# Patient Record
Sex: Male | Born: 1945 | Race: Black or African American | Hispanic: No | Marital: Single | Smoking: Current every day smoker
Health system: Southern US, Community
[De-identification: ages and names within clinical notes are randomized; demographics above are authoritative.]

## PROBLEM LIST (undated history)

## (undated) DIAGNOSIS — H269 Unspecified cataract: Secondary | ICD-10-CM

## (undated) DIAGNOSIS — T4145XA Adverse effect of unspecified anesthetic, initial encounter: Secondary | ICD-10-CM

## (undated) DIAGNOSIS — T8859XA Other complications of anesthesia, initial encounter: Secondary | ICD-10-CM

## (undated) DIAGNOSIS — G4700 Insomnia, unspecified: Secondary | ICD-10-CM

## (undated) DIAGNOSIS — M2550 Pain in unspecified joint: Secondary | ICD-10-CM

## (undated) DIAGNOSIS — R531 Weakness: Secondary | ICD-10-CM

## (undated) DIAGNOSIS — F32A Depression, unspecified: Secondary | ICD-10-CM

## (undated) DIAGNOSIS — F419 Anxiety disorder, unspecified: Secondary | ICD-10-CM

## (undated) DIAGNOSIS — R609 Edema, unspecified: Secondary | ICD-10-CM

## (undated) DIAGNOSIS — R600 Localized edema: Secondary | ICD-10-CM

## (undated) DIAGNOSIS — I10 Essential (primary) hypertension: Secondary | ICD-10-CM

## (undated) DIAGNOSIS — M1990 Unspecified osteoarthritis, unspecified site: Secondary | ICD-10-CM

## (undated) DIAGNOSIS — R0602 Shortness of breath: Secondary | ICD-10-CM

## (undated) DIAGNOSIS — E785 Hyperlipidemia, unspecified: Secondary | ICD-10-CM

## (undated) DIAGNOSIS — F329 Major depressive disorder, single episode, unspecified: Secondary | ICD-10-CM

## (undated) DIAGNOSIS — I2510 Atherosclerotic heart disease of native coronary artery without angina pectoris: Secondary | ICD-10-CM

## (undated) HISTORY — PX: CORONARY ANGIOPLASTY: SHX604

## (undated) HISTORY — PX: ESOPHAGOGASTRODUODENOSCOPY: SHX1529

## (undated) HISTORY — PX: OTHER SURGICAL HISTORY: SHX169

---
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
## 1999-02-08 ENCOUNTER — Emergency Department (HOSPITAL_COMMUNITY): Admission: EM | Admit: 1999-02-08 | Discharge: 1999-02-08 | Payer: Self-pay | Admitting: Emergency Medicine

## 1999-02-09 ENCOUNTER — Encounter: Payer: Self-pay | Admitting: Emergency Medicine

## 1999-10-12 HISTORY — PX: BACK SURGERY: SHX140

## 2000-02-23 ENCOUNTER — Emergency Department (HOSPITAL_COMMUNITY): Admission: EM | Admit: 2000-02-23 | Discharge: 2000-02-23 | Payer: Self-pay | Admitting: Emergency Medicine

## 2000-03-23 ENCOUNTER — Encounter: Payer: Self-pay | Admitting: Surgery

## 2000-03-25 ENCOUNTER — Ambulatory Visit (HOSPITAL_COMMUNITY): Admission: RE | Admit: 2000-03-25 | Discharge: 2000-03-26 | Payer: Self-pay | Admitting: Surgery

## 2001-10-11 HISTORY — PX: REPLANTATION THUMB: SUR1233

## 2005-02-09 ENCOUNTER — Inpatient Hospital Stay: Payer: Self-pay | Admitting: Cardiology

## 2005-02-09 ENCOUNTER — Other Ambulatory Visit: Payer: Self-pay

## 2005-02-10 ENCOUNTER — Other Ambulatory Visit: Payer: Self-pay

## 2005-02-11 ENCOUNTER — Other Ambulatory Visit: Payer: Self-pay

## 2005-06-04 ENCOUNTER — Emergency Department (HOSPITAL_COMMUNITY): Admission: EM | Admit: 2005-06-04 | Discharge: 2005-06-04 | Payer: Self-pay | Admitting: Emergency Medicine

## 2005-06-29 ENCOUNTER — Ambulatory Visit (HOSPITAL_COMMUNITY): Admission: RE | Admit: 2005-06-29 | Discharge: 2005-06-29 | Payer: Self-pay | Admitting: Surgery

## 2005-07-28 ENCOUNTER — Emergency Department: Payer: Self-pay | Admitting: Emergency Medicine

## 2005-07-29 ENCOUNTER — Other Ambulatory Visit: Payer: Self-pay

## 2005-08-07 ENCOUNTER — Emergency Department (HOSPITAL_COMMUNITY): Admission: EM | Admit: 2005-08-07 | Discharge: 2005-08-07 | Payer: Self-pay | Admitting: Emergency Medicine

## 2005-10-11 HISTORY — PX: HERNIA REPAIR: SHX51

## 2005-12-04 ENCOUNTER — Ambulatory Visit: Payer: Self-pay | Admitting: Neurology

## 2008-04-27 ENCOUNTER — Emergency Department: Payer: Self-pay | Admitting: Unknown Physician Specialty

## 2008-12-21 ENCOUNTER — Emergency Department: Payer: Self-pay | Admitting: Emergency Medicine

## 2010-05-11 HISTORY — PX: CARDIAC CATHETERIZATION: SHX172

## 2011-10-07 ENCOUNTER — Inpatient Hospital Stay: Payer: Self-pay | Admitting: Internal Medicine

## 2013-08-03 ENCOUNTER — Other Ambulatory Visit: Payer: Self-pay | Admitting: Orthopedic Surgery

## 2013-09-11 ENCOUNTER — Encounter (HOSPITAL_BASED_OUTPATIENT_CLINIC_OR_DEPARTMENT_OTHER): Payer: Self-pay | Admitting: *Deleted

## 2013-09-11 NOTE — Progress Notes (Signed)
Pt very slow talker-hard to get accurate medical info-called [facility] for pcp and cardiology notes-first said no heart problems, but has 2 stents-last ov cardiology 2013-no adm with cp or sob in past-smokes- To come in for ekg-bmet

## 2013-09-11 NOTE — Progress Notes (Signed)
Reviewed notes with dr Loney Laurence f/u with pcp-and pcp knows he is having finger surgery-has not seen cardiology since 2013- Dr Gypsy Balsam ok with this surgery without seeing cardiology

## 2013-09-11 NOTE — Progress Notes (Signed)
09/11/13 1058  OBSTRUCTIVE SLEEP APNEA  Have you ever been diagnosed with sleep apnea through a sleep study? No  Do you snore loudly (loud enough to be heard through closed doors)?  1  Do you often feel tired, fatigued, or sleepy during the daytime? 0  Has anyone observed you stop breathing during your sleep? 0  Do you have, or are you being treated for high blood pressure? 1  BMI more than 35 kg/m2? 0  Age over 66 years old? 1  Neck circumference greater than 40 cm/18 inches? 0  Gender: 1  Obstructive Sleep Apnea Score 4

## 2013-09-12 ENCOUNTER — Other Ambulatory Visit: Payer: Self-pay

## 2013-09-12 ENCOUNTER — Encounter (HOSPITAL_BASED_OUTPATIENT_CLINIC_OR_DEPARTMENT_OTHER)
Admission: RE | Admit: 2013-09-12 | Discharge: 2013-09-12 | Disposition: A | Discharge status: Home / Self Care | Payer: Medicare Other | Source: Ambulatory Visit | Attending: Orthopedic Surgery | Admitting: Orthopedic Surgery

## 2013-09-12 DIAGNOSIS — Z01812 Encounter for preprocedural laboratory examination: Secondary | ICD-10-CM | POA: Insufficient documentation

## 2013-09-12 DIAGNOSIS — Z01810 Encounter for preprocedural cardiovascular examination: Secondary | ICD-10-CM | POA: Insufficient documentation

## 2013-09-12 DIAGNOSIS — Z01818 Encounter for other preprocedural examination: Secondary | ICD-10-CM | POA: Insufficient documentation

## 2013-09-12 LAB — BASIC METABOLIC PANEL
CO2: 24 mEq/L (ref 19–32)
Chloride: 100 mEq/L (ref 96–112)
Glucose, Bld: 82 mg/dL (ref 70–99)
Sodium: 136 mEq/L (ref 135–145)

## 2013-09-13 ENCOUNTER — Encounter (HOSPITAL_BASED_OUTPATIENT_CLINIC_OR_DEPARTMENT_OTHER): Payer: Self-pay | Admitting: *Deleted

## 2013-09-13 ENCOUNTER — Encounter (HOSPITAL_BASED_OUTPATIENT_CLINIC_OR_DEPARTMENT_OTHER)
Admission: RE | Disposition: A | Discharge status: Home / Self Care | Payer: Self-pay | Source: Ambulatory Visit | Attending: Orthopedic Surgery

## 2013-09-13 ENCOUNTER — Encounter (HOSPITAL_BASED_OUTPATIENT_CLINIC_OR_DEPARTMENT_OTHER): Payer: Medicare Other | Admitting: Anesthesiology

## 2013-09-13 ENCOUNTER — Ambulatory Visit (HOSPITAL_BASED_OUTPATIENT_CLINIC_OR_DEPARTMENT_OTHER)
Admission: RE | Admit: 2013-09-13 | Discharge: 2013-09-13 | Disposition: A | Discharge status: Home / Self Care | Payer: Medicare Other | Source: Ambulatory Visit | Attending: Orthopedic Surgery | Admitting: Orthopedic Surgery

## 2013-09-13 ENCOUNTER — Ambulatory Visit (HOSPITAL_BASED_OUTPATIENT_CLINIC_OR_DEPARTMENT_OTHER): Payer: Medicare Other | Admitting: Anesthesiology

## 2013-09-13 DIAGNOSIS — M799 Soft tissue disorder, unspecified: Secondary | ICD-10-CM | POA: Insufficient documentation

## 2013-09-13 DIAGNOSIS — M898X9 Other specified disorders of bone, unspecified site: Secondary | ICD-10-CM | POA: Insufficient documentation

## 2013-09-13 DIAGNOSIS — Z01812 Encounter for preprocedural laboratory examination: Secondary | ICD-10-CM | POA: Insufficient documentation

## 2013-09-13 DIAGNOSIS — L608 Other nail disorders: Secondary | ICD-10-CM | POA: Insufficient documentation

## 2013-09-13 DIAGNOSIS — F341 Dysthymic disorder: Secondary | ICD-10-CM | POA: Insufficient documentation

## 2013-09-13 DIAGNOSIS — I10 Essential (primary) hypertension: Secondary | ICD-10-CM | POA: Insufficient documentation

## 2013-09-13 DIAGNOSIS — Z01810 Encounter for preprocedural cardiovascular examination: Secondary | ICD-10-CM | POA: Insufficient documentation

## 2013-09-13 DIAGNOSIS — F17200 Nicotine dependence, unspecified, uncomplicated: Secondary | ICD-10-CM | POA: Insufficient documentation

## 2013-09-13 HISTORY — DX: Depression, unspecified: F32.A

## 2013-09-13 HISTORY — DX: Major depressive disorder, single episode, unspecified: F32.9

## 2013-09-13 HISTORY — DX: Unspecified osteoarthritis, unspecified site: M19.90

## 2013-09-13 HISTORY — DX: Atherosclerotic heart disease of native coronary artery without angina pectoris: I25.10

## 2013-09-13 HISTORY — DX: Shortness of breath: R06.02

## 2013-09-13 HISTORY — DX: Anxiety disorder, unspecified: F41.9

## 2013-09-13 HISTORY — DX: Hyperlipidemia, unspecified: E78.5

## 2013-09-13 HISTORY — DX: Essential (primary) hypertension: I10

## 2013-09-13 HISTORY — PX: NAILBED REPAIR: SHX5028

## 2013-09-13 LAB — POCT HEMOGLOBIN-HEMACUE: Hemoglobin: 14.8 g/dL (ref 13.0–17.0)

## 2013-09-13 SURGERY — REPAIR, NAIL BED
Anesthesia: General | Site: Thumb | Laterality: Left

## 2013-09-13 MED ORDER — BUPIVACAINE HCL (PF) 0.5 % IJ SOLN
INTRAMUSCULAR | Status: AC
  Filled 2013-09-13: qty 30

## 2013-09-13 MED ORDER — MEPERIDINE HCL 25 MG/ML IJ SOLN: 6.2500 mg | INTRAMUSCULAR | Status: DC | PRN

## 2013-09-13 MED ORDER — OXYCODONE HCL 5 MG/5ML PO SOLN: 5.0000 mg | Freq: Once | ORAL | Status: AC | PRN

## 2013-09-13 MED ORDER — FENTANYL CITRATE 0.05 MG/ML IJ SOLN
INTRAMUSCULAR | Status: AC
  Filled 2013-09-13: qty 4

## 2013-09-13 MED ORDER — OXYCODONE HCL 5 MG PO TABS: 5.0000 mg | ORAL_TABLET | ORAL | Status: DC | PRN

## 2013-09-13 MED ORDER — OXYCODONE HCL 5 MG PO TABS
5.0000 mg | ORAL_TABLET | Freq: Once | ORAL | Status: AC | PRN
  Administered 2013-09-13: 5 mg via ORAL

## 2013-09-13 MED ORDER — PROPOFOL 10 MG/ML IV BOLUS
INTRAVENOUS | Status: AC
  Filled 2013-09-13: qty 20

## 2013-09-13 MED ORDER — BUPIVACAINE HCL (PF) 0.25 % IJ SOLN
INTRAMUSCULAR | Status: DC | PRN
  Administered 2013-09-13: 18 mL

## 2013-09-13 MED ORDER — OXYCODONE HCL 5 MG PO TABS
ORAL_TABLET | ORAL | Status: AC
  Filled 2013-09-13: qty 1

## 2013-09-13 MED ORDER — CEFAZOLIN SODIUM-DEXTROSE 2-3 GM-% IV SOLR
2.0000 g | INTRAVENOUS | Status: AC
  Administered 2013-09-13: 2 g via INTRAVENOUS

## 2013-09-13 MED ORDER — MIDAZOLAM HCL 2 MG/2ML IJ SOLN
INTRAMUSCULAR | Status: AC
  Filled 2013-09-13: qty 2

## 2013-09-13 MED ORDER — CHLORHEXIDINE GLUCONATE 4 % EX LIQD: 60.0000 mL | Freq: Once | CUTANEOUS | Status: DC

## 2013-09-13 MED ORDER — ONDANSETRON HCL 4 MG/2ML IJ SOLN: 4.0000 mg | Freq: Once | INTRAMUSCULAR | Status: DC | PRN

## 2013-09-13 MED ORDER — HYDROMORPHONE HCL PF 1 MG/ML IJ SOLN: 0.2500 mg | INTRAMUSCULAR | Status: DC | PRN

## 2013-09-13 MED ORDER — LACTATED RINGERS IV SOLN
INTRAVENOUS | Status: DC
  Administered 2013-09-13: 12:00:00 via INTRAVENOUS

## 2013-09-13 MED ORDER — MIDAZOLAM HCL 5 MG/5ML IJ SOLN
INTRAMUSCULAR | Status: DC | PRN
  Administered 2013-09-13: 2 mg via INTRAVENOUS

## 2013-09-13 MED ORDER — DEXAMETHASONE SODIUM PHOSPHATE 4 MG/ML IJ SOLN
INTRAMUSCULAR | Status: DC | PRN
  Administered 2013-09-13: 10 mg via INTRAVENOUS

## 2013-09-13 MED ORDER — FENTANYL CITRATE 0.05 MG/ML IJ SOLN
INTRAMUSCULAR | Status: DC | PRN
  Administered 2013-09-13: 100 ug via INTRAVENOUS

## 2013-09-13 MED ORDER — BUPIVACAINE HCL (PF) 0.25 % IJ SOLN
INTRAMUSCULAR | Status: AC
  Filled 2013-09-13: qty 30

## 2013-09-13 MED ORDER — FENTANYL CITRATE 0.05 MG/ML IJ SOLN: 50.0000 ug | INTRAMUSCULAR | Status: DC | PRN

## 2013-09-13 MED ORDER — PROPOFOL 10 MG/ML IV BOLUS
INTRAVENOUS | Status: DC | PRN
  Administered 2013-09-13: 150 mg via INTRAVENOUS

## 2013-09-13 MED ORDER — SODIUM BICARBONATE 4 % IV SOLN
INTRAVENOUS | Status: AC
  Filled 2013-09-13: qty 5

## 2013-09-13 MED ORDER — CEFAZOLIN SODIUM 1-5 GM-% IV SOLN
INTRAVENOUS | Status: AC
  Filled 2013-09-13: qty 100

## 2013-09-13 MED ORDER — LIDOCAINE HCL (PF) 1 % IJ SOLN
INTRAMUSCULAR | Status: AC
  Filled 2013-09-13: qty 30

## 2013-09-13 MED ORDER — LIDOCAINE HCL (CARDIAC) 20 MG/ML IV SOLN
INTRAVENOUS | Status: DC | PRN
  Administered 2013-09-13: 100 mg via INTRAVENOUS

## 2013-09-13 MED ORDER — ONDANSETRON HCL 4 MG/2ML IJ SOLN
INTRAMUSCULAR | Status: DC | PRN
  Administered 2013-09-13: 4 mg via INTRAVENOUS

## 2013-09-13 MED ORDER — MIDAZOLAM HCL 2 MG/2ML IJ SOLN: 1.0000 mg | INTRAMUSCULAR | Status: DC | PRN

## 2013-09-13 SURGICAL SUPPLY — 65 items
BANDAGE COBAN STERILE 2 (GAUZE/BANDAGES/DRESSINGS) IMPLANT
BANDAGE ELASTIC 3 VELCRO ST LF (GAUZE/BANDAGES/DRESSINGS) ×2 IMPLANT
BANDAGE ELASTIC 4 VELCRO ST LF (GAUZE/BANDAGES/DRESSINGS) IMPLANT
BLADE SURG 15 STRL LF DISP TIS (BLADE) ×2 IMPLANT
BLADE SURG 15 STRL SS (BLADE) ×2
BNDG COHESIVE 1X5 TAN STRL LF (GAUZE/BANDAGES/DRESSINGS) ×2 IMPLANT
BNDG COHESIVE 3X5 TAN STRL LF (GAUZE/BANDAGES/DRESSINGS) IMPLANT
BNDG CONFORM 2 STRL LF (GAUZE/BANDAGES/DRESSINGS) IMPLANT
BNDG CONFORM 3 STRL LF (GAUZE/BANDAGES/DRESSINGS) IMPLANT
BNDG ELASTIC 2 VLCR STRL LF (GAUZE/BANDAGES/DRESSINGS) IMPLANT
BNDG GAUZE ELAST 4 BULKY (GAUZE/BANDAGES/DRESSINGS) IMPLANT
BRUSH SCRUB EZ PLAIN DRY (MISCELLANEOUS) ×2 IMPLANT
CANISTER SUCT 1200ML W/VALVE (MISCELLANEOUS) IMPLANT
CORDS BIPOLAR (ELECTRODE) ×2 IMPLANT
COVER MAYO STAND STRL (DRAPES) ×2 IMPLANT
COVER TABLE BACK 60X90 (DRAPES) ×2 IMPLANT
CUFF TOURNIQUET SINGLE 18IN (TOURNIQUET CUFF) ×4 IMPLANT
CUFF TOURNIQUET SINGLE 24IN (TOURNIQUET CUFF) IMPLANT
DECANTER SPIKE VIAL GLASS SM (MISCELLANEOUS) IMPLANT
DRAPE EXTREMITY T 121X128X90 (DRAPE) ×2 IMPLANT
DRAPE OEC MINIVIEW 54X84 (DRAPES) IMPLANT
DRAPE SURG 17X23 STRL (DRAPES) ×2 IMPLANT
DRSG EMULSION OIL 3X3 NADH (GAUZE/BANDAGES/DRESSINGS) ×2 IMPLANT
GAUZE SPONGE 4X4 16PLY XRAY LF (GAUZE/BANDAGES/DRESSINGS) IMPLANT
GLOVE BIO SURGEON STRL SZ8 (GLOVE) ×2 IMPLANT
GLOVE SS BIOGEL STRL SZ 8 (GLOVE) ×1 IMPLANT
GLOVE SUPERSENSE BIOGEL SZ 8 (GLOVE) ×1
GLOVE SURG SS PI 7.0 STRL IVOR (GLOVE) ×2 IMPLANT
GOWN PREVENTION PLUS XLARGE (GOWN DISPOSABLE) ×2 IMPLANT
GOWN PREVENTION PLUS XXLARGE (GOWN DISPOSABLE) ×2 IMPLANT
NEEDLE HYPO 22GX1.5 SAFETY (NEEDLE) IMPLANT
NEEDLE HYPO 25X1 1.5 SAFETY (NEEDLE) ×2 IMPLANT
NS IRRIG 1000ML POUR BTL (IV SOLUTION) ×2 IMPLANT
PACK BASIN DAY SURGERY FS (CUSTOM PROCEDURE TRAY) ×2 IMPLANT
PAD ALCOHOL SWAB (MISCELLANEOUS) IMPLANT
PAD CAST 3X4 CTTN HI CHSV (CAST SUPPLIES) IMPLANT
PAD CAST 4YDX4 CTTN HI CHSV (CAST SUPPLIES) IMPLANT
PADDING CAST ABS 3INX4YD NS (CAST SUPPLIES) ×1
PADDING CAST ABS 4INX4YD NS (CAST SUPPLIES) ×1
PADDING CAST ABS COTTON 3X4 (CAST SUPPLIES) ×1 IMPLANT
PADDING CAST ABS COTTON 4X4 ST (CAST SUPPLIES) ×1 IMPLANT
PADDING CAST COTTON 3X4 STRL (CAST SUPPLIES)
PADDING CAST COTTON 4X4 STRL (CAST SUPPLIES)
SHEET MEDIUM DRAPE 40X70 STRL (DRAPES) IMPLANT
SPLINT FINGER 5/8X3.25 (SOFTGOODS) ×1 IMPLANT
SPLINT FINGER FOAM 3 9119 05 (SOFTGOODS) ×2
SPLINT PLASTER CAST XFAST 3X15 (CAST SUPPLIES) IMPLANT
SPLINT PLASTER CAST XFAST 4X15 (CAST SUPPLIES) IMPLANT
SPLINT PLASTER XTRA FAST SET 4 (CAST SUPPLIES)
SPLINT PLASTER XTRA FASTSET 3X (CAST SUPPLIES)
SPONGE GAUZE 4X4 12PLY (GAUZE/BANDAGES/DRESSINGS) ×2 IMPLANT
STOCKINETTE 4X48 STRL (DRAPES) ×2 IMPLANT
STRIP CLOSURE SKIN 1/2X4 (GAUZE/BANDAGES/DRESSINGS) IMPLANT
SUCTION FRAZIER TIP 10 FR DISP (SUCTIONS) IMPLANT
SUT CHROMIC 4 0 PS 2 18 (SUTURE) ×2 IMPLANT
SUT CHROMIC 5 0 P 3 (SUTURE) ×2 IMPLANT
SUT CHROMIC 6 0 G 1 (SUTURE) IMPLANT
SUT CHROMIC 6 0 PS 4 (SUTURE) IMPLANT
SUT PROLENE 4 0 PS 2 18 (SUTURE) ×2 IMPLANT
SYR BULB 3OZ (MISCELLANEOUS) ×2 IMPLANT
SYR CONTROL 10ML LL (SYRINGE) IMPLANT
TOWEL OR 17X24 6PK STRL BLUE (TOWEL DISPOSABLE) ×4 IMPLANT
TOWEL OR NON WOVEN STRL DISP B (DISPOSABLE) ×2 IMPLANT
TUBE CONNECTING 20X1/4 (TUBING) IMPLANT
UNDERPAD 30X30 INCONTINENT (UNDERPADS AND DIAPERS) ×2 IMPLANT

## 2013-09-13 NOTE — Op Note (Signed)
See dictation #[id number] Amanda Pea MD

## 2013-09-13 NOTE — H&P (Signed)
Matthew Norton is an 67 y.o. male.   Chief Complaint: presents for left thumb reconstruction HPI: Marland KitchenMarland KitchenPatient presents for evaluation and treatment of the of their upper extremity predicament. The patient denies neck back chest or of abdominal pain. The patient notes that they have no lower extremity problems. The patient from primarily complains of the upper extremity pain noted.  Past Medical History  Diagnosis Date  . Coronary artery disease   . Hypertension   . Depression   . Anxiety   . Shortness of breath     smoker over 50 yr  . Arthritis   . Hyperlipemia   . Presence of stent in coronary artery     x2    Past Surgical History  Procedure Laterality Date  . Cardiac catheterization  08,11    stents x2  . Clavicle surgery  1965    fx rt   . Back surgery  2001    lumb lam  . Hernia repair  2007    rt and lt ing   . Replantation thumb  2003    left    History reviewed. No pertinent family history. Social History:  reports that he has been smoking.  He does not have any smokeless tobacco history on file. He reports that he does not drink alcohol or use illicit drugs.  Allergies: No Known Allergies  Medications Prior to Admission  Medication Sig Dispense Refill  . aspirin 81 MG tablet Take 81 mg by mouth daily.      . diazepam (VALIUM) 5 MG tablet Take 5 mg by mouth every 12 (twelve) hours as needed for anxiety.      Marland Kitchen losartan (COZAAR) 25 MG tablet Take 25 mg by mouth daily.      . meloxicam (MOBIC) 15 MG tablet Take 15 mg by mouth daily.      . metoprolol tartrate (LOPRESSOR) 25 MG tablet Take 25 mg by mouth every morning.      . pravastatin (PRAVACHOL) 20 MG tablet Take 20 mg by mouth every evening.      . sertraline (ZOLOFT) 100 MG tablet Take 100 mg by mouth daily.        Results for orders placed during the hospital encounter of 09/13/13 (from the past 48 hour(s))  BASIC METABOLIC PANEL     Status: Abnormal   Collection Time    09/12/13  3:00 PM      Result Value  Range   Sodium 136  135 - 145 mEq/L   Potassium 4.9  3.5 - 5.1 mEq/L   Chloride 100  96 - 112 mEq/L   CO2 24  19 - 32 mEq/L   Glucose, Bld 82  70 - 99 mg/dL   BUN 9  6 - 23 mg/dL   Creatinine, Ser 1.61  0.50 - 1.35 mg/dL   Calcium 9.6  8.4 - 09.6 mg/dL   GFR calc non Af Amer 86 (*) >90 mL/min   GFR calc Af Amer >90  >90 mL/min   Comment: (NOTE)     The eGFR has been calculated using the CKD EPI equation.     This calculation has not been validated in all clinical situations.     eGFR's persistently <90 mL/min signify possible Chronic Kidney     Disease.  POCT HEMOGLOBIN-HEMACUE     Status: None   Collection Time    09/13/13 11:38 AM      Result Value Range   Hemoglobin 14.8  13.0 - 17.0  g/dL   No results found.  Review of Systems  HENT: Negative.   Eyes: Negative.   Cardiovascular: Negative.   Gastrointestinal: Negative.   Genitourinary: Negative.   Skin: Negative.   Neurological: Negative.   Endo/Heme/Allergies: Negative.     Blood pressure 150/84, pulse 56, temperature 98.3 F (36.8 C), temperature source Oral, resp. rate 16, height 5\' 9"  (1.753 m), weight 96.276 kg (212 lb 4 oz), SpO2 96.00%. Physical Exam ..The patient is alert and oriented in no acute distress the patient complains of pain in the affected upper extremity.  The patient is noted to have a normal HEENT exam.  Lung fields show equal chest expansion and no shortness of breath  abdomen exam is nontender without distention.  Lower extremity examination does not show any fracture dislocation or blood clot symptoms.  Pelvis is stable neck and back are stable and nontender Patient has a left thumb skin deformity with chronic pain and hypersensitivity Assessment/Plan .Marland KitchenWe are planning surgery for your upper extremity. The risk and benefits of surgery include risk of bleeding infection anesthesia damage to normal structures and failure of the surgery to accomplish its intended goals of relieving symptoms and  restoring function with this in mind we'll going to proceed. I have specifically discussed with the patient the pre-and postoperative regime and the does and don'ts and risk and benefits in great detail. Risk and benefits of surgery also include risk of dystrophy chronic nerve pain failure of the healing process to go onto completion and other inherent risks of surgery The relavent the pathophysiology of the disease/injury process, as well as the alternatives for treatment and postoperative course of action has been discussed in great detail with the patient who desires to proceed.  We will do everything in our power to help you (the patient) restore function to the upper extremity. Is a pleasure to see this patient today.   Karen Chafe 09/13/2013, 1:39 PM

## 2013-09-13 NOTE — Anesthesia Procedure Notes (Signed)
Procedure Name: LMA Insertion Date/Time: 09/13/2013 1:53 PM Performed by: Burna Cash Pre-anesthesia Checklist: Patient identified, Emergency Drugs available, Suction available and Patient being monitored Patient Re-evaluated:Patient Re-evaluated prior to inductionOxygen Delivery Method: Circle System Utilized Preoxygenation: Pre-oxygenation with 100% oxygen Intubation Type: IV induction Ventilation: Mask ventilation without difficulty LMA: LMA inserted LMA Size: 5.0 Number of attempts: 1 Airway Equipment and Method: bite block Placement Confirmation: positive ETCO2 Tube secured with: Tape Dental Injury: Teeth and Oropharynx as per pre-operative assessment

## 2013-09-13 NOTE — Transfer of Care (Signed)
Immediate Anesthesia Transfer of Care Note  Patient: Matthew Norton  Procedure(s) Performed: Procedure(s): LEFT THUMB NAIL REMOVAL, NAIL BED RECONSTRUCTION PARTIAL EXCISION DISTAL PHALANX WITH COMPLEX RECONSTRUCTION  (Left)  Patient Location: PACU  Anesthesia Type:General  Level of Consciousness: awake, alert  and oriented  Airway & Oxygen Therapy: Patient Spontanous Breathing and Patient connected to face mask oxygen  Post-op Assessment: Report given to PACU RN and Post -op Vital signs reviewed and stable  Post vital signs: Reviewed and stable  Complications: No apparent anesthesia complications

## 2013-09-13 NOTE — Anesthesia Preprocedure Evaluation (Signed)
Anesthesia Evaluation  Patient identified by MRN, date of birth, ID band Patient awake    Reviewed: Allergy & Precautions, H&P , NPO status , Patient's Chart, lab work & pertinent test results  Airway Mallampati: I TM Distance: >3 FB Neck ROM: Full    Dental   Pulmonary Current Smoker,          Cardiovascular hypertension, Pt. on medications     Neuro/Psych Anxiety Depression    GI/Hepatic   Endo/Other    Renal/GU      Musculoskeletal   Abdominal   Peds  Hematology   Anesthesia Other Findings   Reproductive/Obstetrics                           Anesthesia Physical Anesthesia Plan  ASA: III  Anesthesia Plan: General   Post-op Pain Management: MAC Combined w/ Regional for Post-op pain   Induction: Intravenous  Airway Management Planned: LMA  Additional Equipment:   Intra-op Plan:   Post-operative Plan: Extubation in OR  Informed Consent: I have reviewed the patients History and Physical, chart, labs and discussed the procedure including the risks, benefits and alternatives for the proposed anesthesia with the patient or authorized representative who has indicated his/her understanding and acceptance.     Plan Discussed with: CRNA and Surgeon  Anesthesia Plan Comments:         Anesthesia Quick Evaluation

## 2013-09-13 NOTE — Anesthesia Postprocedure Evaluation (Signed)
Anesthesia Post Note  Patient: Matthew Norton  Procedure(s) Performed: Procedure(s) (LRB): LEFT THUMB NAIL REMOVAL, NAIL BED RECONSTRUCTION PARTIAL EXCISION DISTAL PHALANX WITH COMPLEX RECONSTRUCTION  (Left)  Anesthesia type: general  Patient location: PACU  Post pain: Pain level controlled  Post assessment: Patient's Cardiovascular Status Stable  Last Vitals:  Filed Vitals:   09/13/13 1610  BP: 146/68  Pulse: 55  Temp: 36.5 C  Resp: 16    Post vital signs: Reviewed and stable  Level of consciousness: sedated  Complications: No apparent anesthesia complications

## 2013-09-17 ENCOUNTER — Encounter (HOSPITAL_BASED_OUTPATIENT_CLINIC_OR_DEPARTMENT_OTHER): Payer: Self-pay | Admitting: Orthopedic Surgery

## 2013-09-17 NOTE — Op Note (Signed)
NAMEJAMEISON, HAJI NO.:  [id number]  MEDICAL RECORD NO.:  [id number]  LOCATION:                               FACILITY:  [facility]  PHYSICIAN:  Dionne Ano. 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  PROCEDURE: 1. Evaluation under anesthesia, left thumb. 2. Partial nail plate excision, left thumb. 3. Removal of bony spur, left thumb distal phalanx. 4. Mass removal, left thumb. 5. Volar flap advancement, left thumb with associated nail bed repair.  SURGEON:  Dionne Ano. Amanda Pea, M.D.  ASSISTANT:  None.  COMPLICATIONS:  None.  ANESTHESIA:  General with postop block in the form of intermetacarpal block.  TOURNIQUET TIME:  Less than 30 minutes.  INDICATIONS:  The patient is a pleasant male who presents for above- mentioned diagnoses, I have counseled in regards to risks and benefits of surgery including risk of infection, bleeding, anesthesia, damage to normal structures, and failure of surgery to accomplish its intended goals, relieving symptoms and restoring function.  With this in mind, he desires to proceed.  All questions have been encouraged and answered preoperatively.  OPERATIVE PROCEDURE:  The patient was seen by myself and Anesthesia. Taken to the operating room where smooth induction general anesthesia, laid supine, appropriately padded, prepped, and draped in usual sterile fashion, Betadine scrub and paint.  Following this, patient underwent evaluation under anesthesia, good stability was noted.  Once this done, tourniquet was  insufflated, partial nail plate was removed with Therapist, nutritional.  Once this done, I made an en bloc excision of skin and subcu tissue.  This was a mass with abnormal skin tissue at the distal edge.  This was a chronically traumatized the area, I sent this for specimen to make sure this was not any type of in situ carcinoma or other abnormality given the chronic changes, chronic pain, and hypersensitivity.  Once this done, I then dissected down, excised the distal spur about the distal phalanx.  This was excision of a portion of the distal phalanx without difficulty.  Once this done, I irrigated copiously and then performed a volar Moberg advancement flap.  The patient tolerated this well.  This was inset with chromic suture and the nail bed was repaired nicely.  There were no complicating features.  All sponge, needle, and instrument counts were reported as correct.  At this time, I irrigated this, I did it multiple points during the operation and dressed him with Adaptic, Xeroform, and finger/thumb splint with sterile dressing.  He tolerated this well. There were no complicating features.  We look forward to seeing him back in the office in 12 days and remove his bandage.  I will go ahead and protect him for the first 3-4 weeks and begin active range of motion.  All questions have  been encouraged and answered.     Dionne Ano. Amanda Pea, M.D.   ______________________________ Dionne Ano. Amanda Pea, M.D.    [facility]  D:  09/13/2013  T:  09/14/2013  Job:  [id number]

## 2013-09-23 ENCOUNTER — Emergency Department: Payer: Self-pay | Admitting: Emergency Medicine

## 2014-09-17 ENCOUNTER — Encounter (INDEPENDENT_AMBULATORY_CARE_PROVIDER_SITE_OTHER): Payer: Self-pay

## 2014-09-17 ENCOUNTER — Encounter: Payer: Self-pay | Admitting: Internal Medicine

## 2014-09-17 ENCOUNTER — Ambulatory Visit (INDEPENDENT_AMBULATORY_CARE_PROVIDER_SITE_OTHER): Payer: Medicare Other | Admitting: Internal Medicine

## 2014-09-17 VITALS — BP 132/70 | HR 52 | Temp 97.8°F | Wt 225.0 lb

## 2014-09-17 DIAGNOSIS — L989 Disorder of the skin and subcutaneous tissue, unspecified: Secondary | ICD-10-CM

## 2014-09-17 DIAGNOSIS — L918 Other hypertrophic disorders of the skin: Secondary | ICD-10-CM

## 2014-09-17 DIAGNOSIS — R220 Localized swelling, mass and lump, head: Secondary | ICD-10-CM

## 2014-09-17 DIAGNOSIS — K118 Other diseases of salivary glands: Secondary | ICD-10-CM

## 2014-09-17 LAB — BASIC METABOLIC PANEL
BUN: 17 mg/dL (ref 6–23)
CALCIUM: 9.8 mg/dL (ref 8.4–10.5)
CO2: 26 meq/L (ref 19–32)
Chloride: 104 mEq/L (ref 96–112)
Creatinine, Ser: 1.1 mg/dL (ref 0.4–1.5)
GFR: 82.39 mL/min (ref >60.00)
GLUCOSE: 79 mg/dL (ref 70–99)
POTASSIUM: 4.3 meq/L (ref 3.5–5.1)
SODIUM: 136 meq/L (ref 135–145)

## 2014-09-17 NOTE — Addendum Note (Signed)
Addended by: Jearld Fenton on: 09/17/2014 02:12 PM   Modules accepted: Orders

## 2014-09-17 NOTE — Progress Notes (Signed)
Pre visit review using our clinic review tool, if applicable. No additional management support is needed unless otherwise documented below in the visit note. 

## 2014-09-17 NOTE — Progress Notes (Signed)
Subjective:    Patient ID: Matthew Norton, male    DOB: 1945/11/04, 68 y.o.   MRN: [id number]  HPI  Pt presents to the clinic today with c/o jaw pain. He reports this started 6 months ago. He has noticed "knots" growing behind both of his ears. He has has an associated burning in his ears. He denies any trauma to the area. He denies loss of hearing. He has not tried anything OTC.  Review of Systems      Past Medical History  Diagnosis Date  . Coronary artery disease   . Hypertension   . Depression   . Anxiety   . Shortness of breath     smoker over 50 yr  . Arthritis   . Hyperlipemia   . Presence of stent in coronary artery     x2    Current Outpatient Prescriptions  Medication Sig Dispense Refill  . aspirin 81 MG tablet Take 81 mg by mouth daily.    . diazepam (VALIUM) 5 MG tablet Take 5 mg by mouth every 12 (twelve) hours as needed for anxiety.    Marland Kitchen losartan (COZAAR) 25 MG tablet Take 25 mg by mouth daily.    . meloxicam (MOBIC) 15 MG tablet Take 15 mg by mouth daily.    . metoprolol tartrate (LOPRESSOR) 25 MG tablet Take 25 mg by mouth every morning.    Marland Kitchen oxyCODONE (ROXICODONE) 5 MG immediate release tablet Take 1 tablet (5 mg total) by mouth every 4 (four) hours as needed for severe pain. 40 tablet 0  . pravastatin (PRAVACHOL) 20 MG tablet Take 20 mg by mouth every evening.    . sertraline (ZOLOFT) 100 MG tablet Take 100 mg by mouth daily.     No current facility-administered medications for this visit.    No Known Allergies  No family history on file.  History   Social History  . Marital Status: Married    Spouse Name: N/A    Number of Children: N/A  . Years of Education: N/A   Occupational History  . Not on file.   Social History Main Topics  . Smoking status: Current Every Day Smoker -- 1.00 packs/day  . Smokeless tobacco: Not on file  . Alcohol Use: No  . Drug Use: No  . Sexual Activity: Not on file   Other Topics Concern  . Not on file    Social History Narrative     Constitutional: Denies fever, malaise, fatigue, headache or abrupt weight changes.  HEENT: Pt reports ear pain. Denies eye pain, eye redness,  ringing in the ears, wax buildup, runny nose, nasal congestion, bloody nose, or sore throat. Skin: Pt reports knots growing behind his ears. Denies redness, rashes, lesions or ulcercations.    No other specific complaints in a complete review of systems (except as listed in HPI above).  Objective:   Physical Exam  BP 132/70 mmHg  Pulse 52  Temp(Src) 97.8 F (36.6 C) (Oral)  Wt 225 lb (102.059 kg)  SpO2 98% Wt Readings from Last 3 Encounters:  09/17/14 225 lb (102.059 kg)  09/13/13 212 lb 4 oz (96.276 kg)    General: Appears his stated age, obese in NAD. Skin: Warm, dry and intact. Skin tag noted behind bilateral ears. HEENT: Head: normal shape and size; Ears: Tm's gray and intact, normal light reflex;Throat/Mouth: Teeth missing, mucosa pink and moist, no exudate, lesions or ulcerations noted.  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 Cardiovascular: Normal rate  and rhythm. S1,S2 noted.  No murmur, rubs or gallops noted. Pulmonary/Chest: Normal effort and positive vesicular breath sounds. No respiratory distress. No wheezes, rales or ronchi noted.      BMET    Component Value Date/Time   NA 136 09/12/2013 1500   K 4.9 09/12/2013 1500   CL 100 09/12/2013 1500   CO2 24 09/12/2013 1500   GLUCOSE 82 09/12/2013 1500   BUN 9 09/12/2013 1500   CREATININE 0.92 09/12/2013 1500   CALCIUM 9.6 09/12/2013 1500   GFRNONAA 86* 09/12/2013 1500   GFRAA >90 09/12/2013 1500          Assessment & Plan:   Parotid Mass:  Will obtain CT of head and neck Take Advil for the pain  Skin Tags of bilateral ears:  These can be snipped off We need to take care of the parotid mass first  Will follow up with you after CT scan is done

## 2014-09-18 ENCOUNTER — Telehealth: Payer: Self-pay | Admitting: Family Medicine

## 2014-09-18 NOTE — Telephone Encounter (Signed)
emmi mailed  °

## 2014-09-20 ENCOUNTER — Ambulatory Visit: Payer: Self-pay | Admitting: Internal Medicine

## 2014-09-23 ENCOUNTER — Encounter: Payer: Self-pay | Admitting: Internal Medicine

## 2014-10-21 ENCOUNTER — Telehealth: Payer: Self-pay | Admitting: Family Medicine

## 2014-10-21 DIAGNOSIS — I872 Venous insufficiency (chronic) (peripheral): Secondary | ICD-10-CM | POA: Diagnosis not present

## 2014-10-21 DIAGNOSIS — M7989 Other specified soft tissue disorders: Secondary | ICD-10-CM | POA: Diagnosis not present

## 2014-10-21 DIAGNOSIS — M79609 Pain in unspecified limb: Secondary | ICD-10-CM | POA: Diagnosis not present

## 2014-10-21 DIAGNOSIS — M549 Dorsalgia, unspecified: Secondary | ICD-10-CM | POA: Diagnosis not present

## 2014-10-21 NOTE — Telephone Encounter (Signed)
[facility] called.  Patient has a new patient appointment with Dr.Duncan in March.  Dr.Schnier wants to know if patient can be seen sooner for facial pain. Please advise.

## 2014-10-21 NOTE — Telephone Encounter (Signed)
Please schedule 57min appointment when possible, try to get the vascular records in the meantime. Thanks.

## 2014-10-25 ENCOUNTER — Encounter: Payer: Self-pay | Admitting: Family Medicine

## 2014-10-25 ENCOUNTER — Ambulatory Visit (INDEPENDENT_AMBULATORY_CARE_PROVIDER_SITE_OTHER): Payer: Medicare Other | Admitting: Family Medicine

## 2014-10-25 ENCOUNTER — Encounter (INDEPENDENT_AMBULATORY_CARE_PROVIDER_SITE_OTHER): Payer: Self-pay

## 2014-10-25 VITALS — BP 132/80 | HR 67 | Temp 98.4°F | Ht 69.0 in | Wt 227.5 lb

## 2014-10-25 DIAGNOSIS — I10 Essential (primary) hypertension: Secondary | ICD-10-CM

## 2014-10-25 DIAGNOSIS — F329 Major depressive disorder, single episode, unspecified: Secondary | ICD-10-CM

## 2014-10-25 DIAGNOSIS — I2510 Atherosclerotic heart disease of native coronary artery without angina pectoris: Secondary | ICD-10-CM

## 2014-10-25 DIAGNOSIS — E785 Hyperlipidemia, unspecified: Secondary | ICD-10-CM

## 2014-10-25 DIAGNOSIS — M542 Cervicalgia: Secondary | ICD-10-CM

## 2014-10-25 DIAGNOSIS — R220 Localized swelling, mass and lump, head: Secondary | ICD-10-CM

## 2014-10-25 DIAGNOSIS — F419 Anxiety disorder, unspecified: Secondary | ICD-10-CM

## 2014-10-25 DIAGNOSIS — K118 Other diseases of salivary glands: Secondary | ICD-10-CM

## 2014-10-25 DIAGNOSIS — I872 Venous insufficiency (chronic) (peripheral): Secondary | ICD-10-CM

## 2014-10-25 DIAGNOSIS — F418 Other specified anxiety disorders: Secondary | ICD-10-CM

## 2014-10-25 NOTE — Progress Notes (Signed)
Pre visit review using our clinic review tool, if applicable. No additional management support is needed unless otherwise documented below in the visit note.  Seeing patient for first time today.  I was told the point of the visit was his neck pain.  Being seen by vascular surgery for leg venous insufficiency, notes reviewed.  With B arm pain, L>R, burning, from the neck to the elbow. Known deg changes in the C spine.  Taking aleve.  Gabapentin didn't help.   Also with h/o anxiety, "jumpy" and "moody", better on SSRI with no SI/HI.   R parotid mass.  Prev seen by Avie Echevaria here, sent for CT, but there was no notification to our office here that the CT had been done.  He hasn't had f/u about this.  CT report d/w pt.  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    PMH and SH reviewed  ROS: See HPI, otherwise noncontributory.  Meds, vitals, and allergies reviewed.   nad ncat except for R parotid mass Neck supple, no LA No dec in ROM at the neck but pain with ROM No weakness in the BUE rrr ctab Abd soft, not ttp Ext with 1+ BLE edema

## 2014-10-25 NOTE — Patient Instructions (Signed)
Matthew Norton will call about your referral. I need to get the parotid mass evaluated before we do anything about your neck.  We'll likely refer you to the spine clinic when I have the details on the parotid mass.  Please call about a follow up with the cardiology clinic.  Take care.

## 2014-10-27 ENCOUNTER — Encounter: Payer: Self-pay | Admitting: Family Medicine

## 2014-10-27 DIAGNOSIS — I10 Essential (primary) hypertension: Secondary | ICD-10-CM | POA: Insufficient documentation

## 2014-10-27 DIAGNOSIS — R220 Localized swelling, mass and lump, head: Secondary | ICD-10-CM | POA: Insufficient documentation

## 2014-10-27 DIAGNOSIS — M4802 Spinal stenosis, cervical region: Secondary | ICD-10-CM | POA: Insufficient documentation

## 2014-10-27 DIAGNOSIS — I2510 Atherosclerotic heart disease of native coronary artery without angina pectoris: Secondary | ICD-10-CM | POA: Insufficient documentation

## 2014-10-27 DIAGNOSIS — F419 Anxiety disorder, unspecified: Secondary | ICD-10-CM

## 2014-10-27 DIAGNOSIS — R221 Localized swelling, mass and lump, neck: Secondary | ICD-10-CM

## 2014-10-27 DIAGNOSIS — E785 Hyperlipidemia, unspecified: Secondary | ICD-10-CM | POA: Insufficient documentation

## 2014-10-27 DIAGNOSIS — M542 Cervicalgia: Secondary | ICD-10-CM | POA: Insufficient documentation

## 2014-10-27 DIAGNOSIS — I872 Venous insufficiency (chronic) (peripheral): Secondary | ICD-10-CM | POA: Insufficient documentation

## 2014-10-27 DIAGNOSIS — K118 Other diseases of salivary glands: Secondary | ICD-10-CM | POA: Insufficient documentation

## 2014-10-27 DIAGNOSIS — R900 Intracranial space-occupying lesion found on diagnostic imaging of central nervous system: Secondary | ICD-10-CM | POA: Insufficient documentation

## 2014-10-27 DIAGNOSIS — F329 Major depressive disorder, single episode, unspecified: Secondary | ICD-10-CM | POA: Insufficient documentation

## 2014-10-27 NOTE — Assessment & Plan Note (Signed)
Requested patient to f/u with cards.

## 2014-10-27 NOTE — Assessment & Plan Note (Signed)
We'll try to address this after parotid mass w/u done. No change in meds at this point.

## 2014-10-27 NOTE — Assessment & Plan Note (Signed)
No change in meds.  Will review old records.

## 2014-10-27 NOTE — Assessment & Plan Note (Signed)
Per vascular clinic.

## 2014-10-27 NOTE — Assessment & Plan Note (Signed)
Have reported to [facility] site manager about the scanning process. This is first time I've heard of such an issue.  Baity reported no knowledge of the CT being completed.  I was seeing patient for the first time today.  Will refer to ENT.

## 2014-10-29 DIAGNOSIS — D37030 Neoplasm of uncertain behavior of the parotid salivary glands: Secondary | ICD-10-CM | POA: Diagnosis not present

## 2014-10-29 DIAGNOSIS — L918 Other hypertrophic disorders of the skin: Secondary | ICD-10-CM | POA: Diagnosis not present

## 2014-10-29 DIAGNOSIS — T65222A Toxic effect of tobacco cigarettes, intentional self-harm, initial encounter: Secondary | ICD-10-CM | POA: Diagnosis not present

## 2014-10-29 DIAGNOSIS — D110 Benign neoplasm of parotid gland: Secondary | ICD-10-CM | POA: Diagnosis not present

## 2014-10-31 ENCOUNTER — Ambulatory Visit: Payer: Self-pay | Admitting: Otolaryngology

## 2014-10-31 DIAGNOSIS — Z01810 Encounter for preprocedural cardiovascular examination: Secondary | ICD-10-CM | POA: Diagnosis not present

## 2014-10-31 DIAGNOSIS — I2510 Atherosclerotic heart disease of native coronary artery without angina pectoris: Secondary | ICD-10-CM | POA: Diagnosis not present

## 2014-10-31 DIAGNOSIS — Z01812 Encounter for preprocedural laboratory examination: Secondary | ICD-10-CM | POA: Diagnosis not present

## 2014-10-31 DIAGNOSIS — I10 Essential (primary) hypertension: Secondary | ICD-10-CM | POA: Diagnosis not present

## 2014-10-31 LAB — CBC WITH DIFFERENTIAL/PLATELET
Basophil #: 0 10*3/uL (ref 0.0–0.1)
Basophil %: 0.2 %
EOS ABS: 0.1 10*3/uL (ref 0.0–0.7)
Eosinophil %: 1.4 %
HCT: 47.4 % (ref 40.0–52.0)
HGB: 15.6 g/dL (ref 13.0–18.0)
Lymphocyte #: 2.1 10*3/uL (ref 1.0–3.6)
Lymphocyte %: 36.1 %
MCH: 31.2 pg (ref 26.0–34.0)
MCHC: 32.9 g/dL (ref 32.0–36.0)
MCV: 95 fL (ref 80–100)
MONOS PCT: 7.9 %
Monocyte #: 0.5 x10 3/mm (ref 0.2–1.0)
NEUTROS PCT: 54.4 %
Neutrophil #: 3.2 10*3/uL (ref 1.4–6.5)
PLATELETS: 209 10*3/uL (ref 150–440)
RBC: 4.99 10*6/uL (ref 4.40–5.90)
RDW: 13.9 % (ref 11.5–14.5)
WBC: 5.8 10*3/uL (ref 3.8–10.6)

## 2014-11-06 ENCOUNTER — Telehealth: Payer: Self-pay | Admitting: Family Medicine

## 2014-11-06 NOTE — Telephone Encounter (Signed)
Becky the surgery coordinator with Dr [facility] office called in this morning. She said that she faxed in surgical clearance for Mr Vandenbrink's surgery that is scheduled on 11/20/14. She is requesting to have this completed. If it was not received, she will fax a new request. Please give Jacqlyn Larsen a call back @ [phone].

## 2014-11-06 NOTE — Telephone Encounter (Signed)
Left a detailed message on Becky's voicemail to refax the paperwork to back hall fax machine.

## 2014-11-06 NOTE — Telephone Encounter (Signed)
Form from Jacqlyn Larsen is on your desk.

## 2014-11-06 NOTE — Telephone Encounter (Signed)
If it isn't currently in my inbox- I haven't gotten to [facility] yet today- then I don't have it yet.  Please have them resend it if not in my inbox.  Thanks.

## 2014-11-07 NOTE — Telephone Encounter (Signed)
I looked at the patient's file and the form.  Needs cards eval since he has h/o CAD with stent placed prev.  I had asked patient to re-est with cards when we talked as his last OV.  Thanks.

## 2014-11-07 NOTE — Telephone Encounter (Signed)
Patient advised and says he doesn't have the money for a copay to Cards.  I suggested that he phone his Cardiology office and let them know he needs to be cleared for surgery and see if they will work out a payment plan.  Also left a voicemail for Becky at Dr. Reola Mosher office, advising her of the situation.

## 2014-11-08 DIAGNOSIS — I214 Non-ST elevation (NSTEMI) myocardial infarction: Secondary | ICD-10-CM | POA: Insufficient documentation

## 2014-11-08 DIAGNOSIS — Z9889 Other specified postprocedural states: Secondary | ICD-10-CM | POA: Insufficient documentation

## 2014-11-11 DIAGNOSIS — R0602 Shortness of breath: Secondary | ICD-10-CM | POA: Insufficient documentation

## 2014-11-11 DIAGNOSIS — I2510 Atherosclerotic heart disease of native coronary artery without angina pectoris: Secondary | ICD-10-CM | POA: Diagnosis not present

## 2014-11-11 DIAGNOSIS — I214 Non-ST elevation (NSTEMI) myocardial infarction: Secondary | ICD-10-CM | POA: Diagnosis not present

## 2014-11-11 DIAGNOSIS — Z9889 Other specified postprocedural states: Secondary | ICD-10-CM | POA: Diagnosis not present

## 2014-11-11 DIAGNOSIS — I10 Essential (primary) hypertension: Secondary | ICD-10-CM | POA: Diagnosis not present

## 2014-11-11 DIAGNOSIS — R600 Localized edema: Secondary | ICD-10-CM | POA: Insufficient documentation

## 2014-11-15 DIAGNOSIS — Z01810 Encounter for preprocedural cardiovascular examination: Secondary | ICD-10-CM | POA: Diagnosis not present

## 2014-11-15 DIAGNOSIS — R600 Localized edema: Secondary | ICD-10-CM | POA: Diagnosis not present

## 2014-11-15 DIAGNOSIS — I2510 Atherosclerotic heart disease of native coronary artery without angina pectoris: Secondary | ICD-10-CM | POA: Diagnosis not present

## 2014-11-15 DIAGNOSIS — R0602 Shortness of breath: Secondary | ICD-10-CM | POA: Diagnosis not present

## 2014-11-19 DIAGNOSIS — I2510 Atherosclerotic heart disease of native coronary artery without angina pectoris: Secondary | ICD-10-CM | POA: Diagnosis not present

## 2014-11-19 DIAGNOSIS — I10 Essential (primary) hypertension: Secondary | ICD-10-CM | POA: Diagnosis not present

## 2014-11-19 DIAGNOSIS — Z9889 Other specified postprocedural states: Secondary | ICD-10-CM | POA: Diagnosis not present

## 2014-11-19 DIAGNOSIS — I214 Non-ST elevation (NSTEMI) myocardial infarction: Secondary | ICD-10-CM | POA: Diagnosis not present

## 2014-11-20 ENCOUNTER — Ambulatory Visit: Payer: Self-pay | Admitting: Otolaryngology

## 2014-11-20 DIAGNOSIS — I10 Essential (primary) hypertension: Secondary | ICD-10-CM | POA: Diagnosis not present

## 2014-11-20 DIAGNOSIS — Q170 Accessory auricle: Secondary | ICD-10-CM | POA: Diagnosis not present

## 2014-11-20 DIAGNOSIS — Z9861 Coronary angioplasty status: Secondary | ICD-10-CM | POA: Diagnosis not present

## 2014-11-20 DIAGNOSIS — L918 Other hypertrophic disorders of the skin: Secondary | ICD-10-CM | POA: Diagnosis not present

## 2014-11-20 DIAGNOSIS — Z23 Encounter for immunization: Secondary | ICD-10-CM | POA: Diagnosis not present

## 2014-11-20 DIAGNOSIS — D110 Benign neoplasm of parotid gland: Secondary | ICD-10-CM | POA: Diagnosis not present

## 2014-11-20 DIAGNOSIS — F17200 Nicotine dependence, unspecified, uncomplicated: Secondary | ICD-10-CM | POA: Diagnosis not present

## 2014-11-20 DIAGNOSIS — I2510 Atherosclerotic heart disease of native coronary artery without angina pectoris: Secondary | ICD-10-CM | POA: Diagnosis not present

## 2014-11-20 DIAGNOSIS — E669 Obesity, unspecified: Secondary | ICD-10-CM | POA: Diagnosis not present

## 2014-11-20 DIAGNOSIS — Z6833 Body mass index (BMI) 33.0-33.9, adult: Secondary | ICD-10-CM | POA: Diagnosis not present

## 2014-11-20 DIAGNOSIS — Z7982 Long term (current) use of aspirin: Secondary | ICD-10-CM | POA: Diagnosis not present

## 2014-11-20 DIAGNOSIS — Z79899 Other long term (current) drug therapy: Secondary | ICD-10-CM | POA: Diagnosis not present

## 2014-11-21 DIAGNOSIS — I10 Essential (primary) hypertension: Secondary | ICD-10-CM | POA: Diagnosis not present

## 2014-11-21 DIAGNOSIS — Z9861 Coronary angioplasty status: Secondary | ICD-10-CM | POA: Diagnosis not present

## 2014-11-21 DIAGNOSIS — Z7982 Long term (current) use of aspirin: Secondary | ICD-10-CM | POA: Diagnosis not present

## 2014-11-21 DIAGNOSIS — L918 Other hypertrophic disorders of the skin: Secondary | ICD-10-CM | POA: Diagnosis not present

## 2014-11-21 DIAGNOSIS — E669 Obesity, unspecified: Secondary | ICD-10-CM | POA: Diagnosis not present

## 2014-11-21 DIAGNOSIS — I2510 Atherosclerotic heart disease of native coronary artery without angina pectoris: Secondary | ICD-10-CM | POA: Diagnosis not present

## 2014-11-21 DIAGNOSIS — F17200 Nicotine dependence, unspecified, uncomplicated: Secondary | ICD-10-CM | POA: Diagnosis not present

## 2014-11-21 DIAGNOSIS — Z23 Encounter for immunization: Secondary | ICD-10-CM | POA: Diagnosis not present

## 2014-11-21 DIAGNOSIS — Z6833 Body mass index (BMI) 33.0-33.9, adult: Secondary | ICD-10-CM | POA: Diagnosis not present

## 2014-11-21 DIAGNOSIS — Z79899 Other long term (current) drug therapy: Secondary | ICD-10-CM | POA: Diagnosis not present

## 2014-11-21 DIAGNOSIS — D110 Benign neoplasm of parotid gland: Secondary | ICD-10-CM | POA: Diagnosis not present

## 2014-11-25 ENCOUNTER — Other Ambulatory Visit: Payer: Self-pay

## 2014-11-25 MED ORDER — DIAZEPAM 5 MG PO TABS: 5.0000 mg | ORAL_TABLET | Freq: Two times a day (BID) | ORAL | Status: DC | PRN

## 2014-11-25 NOTE — Telephone Encounter (Signed)
Please call in.  Thanks.   

## 2014-11-25 NOTE — Telephone Encounter (Signed)
Pt left note requesting refill diazepam to [facility] [address].pt seen 10/25/14.Please advise.

## 2014-11-26 NOTE — Telephone Encounter (Signed)
Medication phoned to pharmacy.  

## 2014-12-04 ENCOUNTER — Ambulatory Visit (INDEPENDENT_AMBULATORY_CARE_PROVIDER_SITE_OTHER): Payer: Medicare Other | Admitting: Family Medicine

## 2014-12-04 ENCOUNTER — Encounter: Payer: Self-pay | Admitting: Family Medicine

## 2014-12-04 VITALS — BP 140/74 | HR 55 | Temp 97.9°F | Wt 223.1 lb

## 2014-12-04 DIAGNOSIS — F418 Other specified anxiety disorders: Secondary | ICD-10-CM | POA: Diagnosis not present

## 2014-12-04 DIAGNOSIS — M542 Cervicalgia: Secondary | ICD-10-CM | POA: Diagnosis not present

## 2014-12-04 DIAGNOSIS — F329 Major depressive disorder, single episode, unspecified: Secondary | ICD-10-CM

## 2014-12-04 DIAGNOSIS — K118 Other diseases of salivary glands: Secondary | ICD-10-CM

## 2014-12-04 DIAGNOSIS — F419 Anxiety disorder, unspecified: Secondary | ICD-10-CM

## 2014-12-04 DIAGNOSIS — R220 Localized swelling, mass and lump, head: Secondary | ICD-10-CM | POA: Diagnosis not present

## 2014-12-04 DIAGNOSIS — F32A Depression, unspecified: Secondary | ICD-10-CM

## 2014-12-04 MED ORDER — SERTRALINE HCL 100 MG PO TABS: 200.0000 mg | ORAL_TABLET | Freq: Every day | ORAL | Status: DC

## 2014-12-04 MED ORDER — SERTRALINE HCL 100 MG PO TABS: 150.0000 mg | ORAL_TABLET | Freq: Every day | ORAL | Status: DC

## 2014-12-04 MED ORDER — DIAZEPAM 5 MG PO TABS: 5.0000 mg | ORAL_TABLET | Freq: Two times a day (BID) | ORAL | Status: DC | PRN

## 2014-12-04 NOTE — Patient Instructions (Addendum)
Increase the sertraline to 2 tabs a day.  Rosaria Ferries will call about your referral. Don't change your other meds for now.  Take care.  Glad to see you.

## 2014-12-04 NOTE — Progress Notes (Signed)
Pre visit review using our clinic review tool, if applicable. No additional management support is needed unless otherwise documented below in the visit note.  His mother was recently in the hospital and was going to SNF today.  Per patient, his sister has a CVA yesterday, she is going to SNF today.  I asked him to give my concerns along to his family.    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  Was removed by ENT.  Minimal pain at the incision site now.  Taking less hydrocodone now.    Neck arthritis.  Known DJD/DDD on prev neck CT.  Known central canal stenosis C4/C5 on prev CT neck.  He hadn't seen ortho about his neck recently.  Still with B arm pain, shooting down the arms.  Grip is still good usually but will occ drop an object- not from loss of sensation, L>R.  His neck ROM is some better recently but still with some pain on ROM.    Anxiety, jittery, was prev on 150mg  sertraline, sx not resolved.  Still using prn BZD, w/o ADE.  Asking about options.    Meds, vitals, and allergies reviewed.   ROS: See HPI.  Otherwise, noncontributory.  Nad ncat Mmm Neck supple, not stiff but some pain with rotational ROM B, no midline posterior pain, no LA rrr ctab abd soft, not ttp Ext w/o edema Sensation wnl for the BUE, no weakness on gross testing BUE, equal DTR BUE.

## 2014-12-05 NOTE — Assessment & Plan Note (Signed)
With B radicular sx.  No emergent sx, but not improved and I want ortho input.  Refer.  Anatomy d/w pt.  He agrees. >25 minutes spent in face to face time with patient, >50% spent in counselling or coordination of care.

## 2014-12-05 NOTE — Assessment & Plan Note (Signed)
Inc sertraline to 200mg  a day, continue prn BZD for now.  D/w pt.  He agrees.

## 2014-12-05 NOTE — Assessment & Plan Note (Signed)
Scar healing well on R side of neck, benign path on the tumor.  App ENT help.

## 2014-12-23 ENCOUNTER — Ambulatory Visit: Payer: Self-pay | Admitting: Orthopedic Surgery

## 2014-12-27 ENCOUNTER — Other Ambulatory Visit: Payer: Self-pay | Admitting: *Deleted

## 2014-12-27 MED ORDER — METOPROLOL TARTRATE 25 MG PO TABS: 25.0000 mg | ORAL_TABLET | Freq: Every morning | ORAL | Status: DC

## 2015-01-07 ENCOUNTER — Ambulatory Visit: Payer: Self-pay | Admitting: Family Medicine

## 2015-01-09 ENCOUNTER — Encounter: Payer: Self-pay | Admitting: Family Medicine

## 2015-01-10 ENCOUNTER — Telehealth: Payer: Self-pay | Admitting: Family Medicine

## 2015-01-10 NOTE — Telephone Encounter (Signed)
Electronic refill request. Last Filled:    60 tablet 0 RF on 12/04/2014  Please advise.

## 2015-01-12 NOTE — Telephone Encounter (Signed)
Please call in.  Thanks.   

## 2015-01-13 ENCOUNTER — Telehealth: Payer: Self-pay | Admitting: *Deleted

## 2015-01-13 ENCOUNTER — Other Ambulatory Visit: Payer: Self-pay | Admitting: Family Medicine
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
## 2015-01-13 NOTE — Telephone Encounter (Signed)
I wouldn't do this as a 90 day rx.   Please add on 3 RF with the original rx.   Thanks.

## 2015-01-13 NOTE — Telephone Encounter (Signed)
Pt called and requested more than one month refill at a time. / lt

## 2015-01-13 NOTE — Telephone Encounter (Signed)
Pharmacy [facility]) advised.

## 2015-01-13 NOTE — Telephone Encounter (Signed)
Please talk to me about this.  Thanks.  

## 2015-02-02 NOTE — Discharge Summary (Signed)
PATIENT NAME:  Matthew Norton, Matthew Norton MR#:  [id number] DATE OF BIRTH:  01/17/1946  DATE OF ADMISSION:  10/07/2011 DATE OF DISCHARGE:  10/08/2011  DISCHARGE DIAGNOSES:  1. Non-ST elevation myocardial infarction.  2. Hypertension.  3. Hyperlipidemia.  4. Depression.  5. Tobacco use disorder.  6. Stent to RCA with balloon angioplasty.   CHIEF COMPLAINT: Chest pain.   HISTORY OF PRESENT ILLNESS: Mr. Philipson is a 70 year old male with a history of coronary artery disease status post LAD stent in 2006, history of hypertension, hyperlipidemia, chronic tobacco use, depression and anxiety who presents to the ER complaining of chest pain radiating to both arms that came on the night prior to admission. The patient states that he felt nauseous and describes a pain as a tight knot in his chest.   PAST MEDICAL HISTORY: Significant for hypertension, hyperlipidemia, depression, anxiety, chronic tobacco use, coronary artery disease status post stent to LAD in 2006.   PAST SURGICAL HISTORY: Significant for inguinal hernia x2, cardiac catheterization and stent placement in 2006, low back surgery.   PHYSICAL EXAMINATION: VITAL SIGNS: He is afebrile. Pulse 56, respirations 20, blood pressure 156/82, pulse oximetry 99% on room air. GENERAL: He was well built, well-nourished male not in distress. HEENT: Normocephalic, atraumatic. NECK: Supple. LUNGS: Clear to auscultation. HEART: S1, S2. ABDOMEN: Soft, nontender. EXTREMITIES: No edema. NEUROLOGIC: Nonfocal.   LABORATORY, DIAGNOSTIC, AND RADIOLOGICAL DATA: WBC count 6.3, hemoglobin 15.1, hematocrit 44.7, platelets 197, sodium 138, potassium 4.3, chloride 104, bicarbonate 25, BUN 13, creatinine 1.09, glucose 99, calcium 9. Troponin 3. PTT was 27.2. Repeat troponin was 19, CK was 734, MB was 50.6.   HOSPITAL COURSE: The patient was admitted to [facility] and started on IV heparin. He was also seen by Dr. Nehemiah Massed and Dr. Saralyn Pilar. He underwent cardiac catheterization. A stent was  performed on the 80% lesion in the proximal RCA and another stent was performed on the 99% of the distal RCA. Following intervention there was zero residual stenosis. The patient tolerated the procedure well. EF calculated by contrast ventriculography was 58%. The patient remained stable during his hospitalization and was ambulated. He did not have any further recurrence of chest pain and was discharged home on the following medications.   DISCHARGE MEDICATIONS:  1. Aspirin 81 mg a day.  2. Diazepam 5 mg b.i.d. p.r.n.  3. Sertraline 100 mg p.o. daily.  4. Lisinopril 10 mg a day.  5. Clopidogrel 75 mg a day.  6. Metoprolol ER 25 mg daily.  7. Prilosec 40 mg a day.  8. Habitrol patch 21 mg a day for three weeks, decrease to 14 mg a day for three weeks, decrease to 7 mg a day for three weeks and then stop.   DISCHARGE INSTRUCTIONS: The patient was strongly advised to quit smoking. He was also given a prescription for nitroglycerin sublingual p.r.n. and advised to follow-up with Dr. Saralyn Pilar. He was also advised to follow-up in the clinic with me, Dr. Ginette Pitman, in 1 to 2 weeks' time.  Diet is low sodium diet. We strongly advised him to quit smoking completely.  ____________________________ Tracie Harrier, MD vh:rbg D: 10/11/2011 17:09:25 ET T: 10/13/2011 14:39:59 ET JOB#: [id number]  cc: Tracie Harrier, MD, <Dictator> Tracie Harrier MD ELECTRONICALLY SIGNED 10/19/2011 17:38

## 2015-02-02 NOTE — Consult Note (Signed)
PATIENT NAME:  Matthew Norton, Matthew Norton MR#:  [id number] DATE OF BIRTH:  01/26/1946  DATE OF CONSULTATION:  10/07/2011  REFERRING PHYSICIAN:  Dr. Ginette Pitman   CONSULTING PHYSICIAN:  Corey Skains, MD  REASON FOR CONSULTATION: Acute subendocardial myocardial infarction.   CHIEF COMPLAINT:  "I have chest pain."   HISTORY OF PRESENT ILLNESS: This is a 69 year old male with known cardiovascular disease status post previous myocardial infarction, with previous LAD stent in 2006 and other stents in the past with hypertension and hyperlipidemia. The patient has had new onset of acute substernal chest discomfort and shortness of breath with weakness most consistent with acute myocardial infarction. There is a minimal elevation of troponin with some EKG changes consistent with previous myocardial infarction. Currently the patient has improved squeezing chest pain with nitroglycerin, oxygen, and beta blocker.   REVIEW OF SYSTEMS: The remainder of the review of systems is negative for vision change, ringing in the ears, hearing loss, cough, congestion, heartburn, nausea, vomiting, diarrhea, bloody stools, stomach pain, extremity pain, leg weakness, cramping of the buttocks, known blood clots, headaches, blackouts, dizzy spells, nosebleed, congestion, trouble swallowing, frequent urination, urination at night, muscle weakness, numbness, anxiety, depression, skin lesions, or skin rashes.   PAST MEDICAL HISTORY:  1. Known coronary artery disease status post PCI and stent placement.  2. Hypertension.  3. Hyperlipidemia.   FAMILY HISTORY: Positive for mother having cardiovascular disease and myocardial infarction.   SOCIAL HISTORY: The patient smokes a pack a day but denies alcohol use.   ALLERGIES: He has no known drug allergies.   CURRENT MEDICATIONS: As listed.   PHYSICAL EXAMINATION:  VITAL SIGNS: Blood pressure 136/68 bilaterally, heart rate 71 upright, reclining, and regular.   GENERAL: He is well-appearing  male in no acute distress.   HEENT: No icterus, thyromegaly, ulcers, hemorrhage, or xanthelasma.   CARDIOVASCULAR: Regular rate and rhythm with normal S1 and S2 without murmur, gallop, or rub. Point of maximal impulse is diffuse. Carotid upstroke normal without bruit. Jugular venous pressure is normal.   LUNGS: Few basilar crackles with normal respirations.   ABDOMEN: Soft, nontender, without hepatosplenomegaly.   EXTREMITIES: 2+  radial, femoral, dorsal pedal pulses with no lower extremity edema, cyanosis, clubbing, or ulcers.   NEUROLOGIC: He is oriented to time, place, and person with normal mood and affect.   ASSESSMENT:  70 year old male with known cardiovascular disease, hypertension, hyperlipidemia, tobacco abuse, with EKG changes and elevation of troponin with chest pain most consistent with subendocardial myocardial infarction.   RECOMMENDATIONS:  1. Continue heparin, nitroglycerin, oxygen, and beta blocker for further risk reduction of infarct.  2. Serial ECG and enzymes to assess extent of myocardial infarction.  3. Proceed to cardiac catheterization to assess coronary anatomy and further treatment thereof as necessary. The patient understands the risks and benefits of cardiac catheterization. These include the possibly of death, stroke, heart attack, infection, bleeding, or blood clot. He is at low risk for conscious sedation.     ____________________________ Corey Skains, MD bjk:bjt D:  10/07/2011 07:49:19 ET          T: 10/07/2011 10:00:03 ET        JOB#: [id number]  cc: Corey Skains, MD, <Dictator> Corey Skains MD ELECTRONICALLY SIGNED 10/18/2011 9:46

## 2015-02-03 LAB — SURGICAL PATHOLOGY

## 2015-02-09 NOTE — Op Note (Signed)
PATIENT NAME:  Matthew Norton, Matthew Norton MR#:  [id number] DATE OF BIRTH:  [birth year]  DATE OF PROCEDURE:  11/20/2014  PREOPERATIVE DIAGNOSES:  1.  Right parotid neoplasm.  2.  Bilateral postauricular skin tags.   POSTOPERATIVE DIAGNOSES:  1.  Right parotid neoplasm.  2.  Bilateral postauricular skin tags.   PROCEDURES:   1.  Right superficial parotidectomy with facial nerve dissection and facial nerve monitoring.  2.  Removal of right and left postauricular skin tags without closure.   SURGEON: Sammuel Hines. Richardson Landry, MD  FIRST ASSISTANT: Jerene Bears, MD    ANESTHESIA: General endotracheal.   INDICATIONS: A 69 year old male with a history of a right parotid neoplasm, which appears to be benign by fine needle aspiration pathology presents for surgical excision.   FINDINGS: This was about a 6 cm soft tissue mass in the right parotid overlying the mid and lower facial nerve branches. He also had a small skin tag behind each ear that was removed.   COMPLICATIONS: None.   DESCRIPTION OF PROCEDURE: After obtaining informed consent, the patient was taken to the operating room and placed in the supine position. After induction of general endotracheal anesthesia, the patient was turned 90 degrees. The facial nerve monitor electrodes were placed in the usual fashion and proper functioning confirmed. The skin in front of the ear and down into the neck was then injected with 1% lidocaine with epinephrine 1:200,000. He was then prepped and draped in the usual sterile fashion.   A 15 blade was used to incise the skin just in front of the ear curving gently under the earlobe in an S shaped incision into the upper neck. A skin flap was elevated over the parotid fascia and down into the neck over the platysma. The incision was then carried down through to the sternocleidomastoid muscle which was identified and followed up to the region of the mastoid. Dissection also proceeded deeply down along the anterior aspect  of the tragal cartilage dissecting deeper and using either bipolar cautery or the harmonic scalpel to divide soft tissue attachments. In this fashion the wound was widely opened separating the parotid away from the tragal cartilage and mastoid tip region until getting down to the region of the tip tympanomastoid fissure. At that point the area was carefully dissected until the facial nerve was identified. This was confirmed with a nerve stimulator. The nerve was then carefully dissected out dissecting the associated parotid gland and mass together away from the branches of the facial nerve. The superior middle and lower branches of the facial nerve were identified during the dissection and carefully dissected out using either bipolar cautery or the harmonic scalpel to divide the parotid bed with the facial nerves safely protected. There was a prominent facial vein which had to be suture ligated during the process of dissection. The mass itself did not have a cuff of tissue between it and the facial nerve but appeared to be well encapsulated. Once the parotid gland and mass were adequately dissected away from the facial nerve extending the dissection anterior to the mass itself, the parotid tissue and associated mass were then resected anteriorly and sent as a specimen. The wound was vigorously irrigated. A #7 JP drain was placed through a separate skin incision and secured with a 4-0 Prolene suture.   The parotid fascia was tacked back as much as possible to the sternocleidomastoid muscle to help fill the defect. Subcutaneous tissues were then closed with 4-0 Vicryl suture in an  interrupted fashion. The skin was closed with 5-0 Prolene suture in a running lock stitch. The drain was then hooked up to suction. Next, the right skin tag on the posterior aspect of the ear was resected at its base with tenotomies. The base of the wound was fairly small as the skin tag was only about 7 mm in size with maybe a 4 mm base.  The base was cauterized with the bipolar cautery to stop minor bleeding with no need for stitches as we were still in the dermal layer. The same procedure was performed on the opposite skin tag, which was maybe about 6 mm with a 3 mm base, again cauterizing the base of the lesion with the bipolar cautery.   The patient was then returned to the anesthesiologist for wakening. He was awakened and taken to the recovery room in good condition postoperatively. Blood loss was approximately 25 mL. Facial nerve monitoring was used throughout the procedure to increase safety of nerve dissection.   ____________________________ Sammuel Hines. Richardson Landry, MD psb:AT D: 11/20/2014 17:50:55 ET T: 11/21/2014 03:22:42 ET JOB#: [id number]  cc: Sammuel Hines. Richardson Landry, MD, <Dictator> Riley Nearing MD ELECTRONICALLY SIGNED 12/11/2014 12:01

## 2015-02-25 ENCOUNTER — Other Ambulatory Visit: Payer: Self-pay | Admitting: Neurosurgery

## 2015-03-18 ENCOUNTER — Encounter (HOSPITAL_COMMUNITY)
Admission: RE | Admit: 2015-03-18 | Discharge: 2015-03-18 | Disposition: A | Discharge status: Home / Self Care | Payer: Medicare HMO | Source: Ambulatory Visit | Attending: Neurosurgery | Admitting: Neurosurgery

## 2015-03-18 ENCOUNTER — Encounter (HOSPITAL_COMMUNITY): Payer: Self-pay

## 2015-03-18 DIAGNOSIS — Z01812 Encounter for preprocedural laboratory examination: Secondary | ICD-10-CM | POA: Insufficient documentation

## 2015-03-18 DIAGNOSIS — I10 Essential (primary) hypertension: Secondary | ICD-10-CM | POA: Diagnosis not present

## 2015-03-18 DIAGNOSIS — M5000 Cervical disc disorder with myelopathy, unspecified cervical region: Secondary | ICD-10-CM | POA: Diagnosis not present

## 2015-03-18 DIAGNOSIS — F17210 Nicotine dependence, cigarettes, uncomplicated: Secondary | ICD-10-CM | POA: Diagnosis not present

## 2015-03-18 DIAGNOSIS — I2510 Atherosclerotic heart disease of native coronary artery without angina pectoris: Secondary | ICD-10-CM | POA: Diagnosis not present

## 2015-03-18 DIAGNOSIS — I252 Old myocardial infarction: Secondary | ICD-10-CM | POA: Insufficient documentation

## 2015-03-18 DIAGNOSIS — Z955 Presence of coronary angioplasty implant and graft: Secondary | ICD-10-CM | POA: Diagnosis not present

## 2015-03-18 DIAGNOSIS — E785 Hyperlipidemia, unspecified: Secondary | ICD-10-CM | POA: Insufficient documentation

## 2015-03-18 HISTORY — DX: Adverse effect of unspecified anesthetic, initial encounter: T41.45XA

## 2015-03-18 HISTORY — DX: Edema, unspecified: R60.9

## 2015-03-18 HISTORY — DX: Unspecified cataract: H26.9

## 2015-03-18 HISTORY — DX: Weakness: R53.1

## 2015-03-18 HISTORY — DX: Insomnia, unspecified: G47.00

## 2015-03-18 HISTORY — DX: Other complications of anesthesia, initial encounter: T88.59XA

## 2015-03-18 HISTORY — DX: Localized edema: R60.0

## 2015-03-18 HISTORY — DX: Pain in unspecified joint: M25.50

## 2015-03-18 LAB — CBC
HEMATOCRIT: 41.8 % (ref 39.0–52.0)
Hemoglobin: 14.5 g/dL (ref 13.0–17.0)
MCH: 31 pg (ref 26.0–34.0)
MCHC: 34.7 g/dL (ref 30.0–36.0)
MCV: 89.5 fL (ref 78.0–100.0)
PLATELETS: 181 10*3/uL (ref 150–400)
RBC: 4.67 MIL/uL (ref 4.22–5.81)
RDW: 13.5 % (ref 11.5–15.5)
WBC: 4.5 10*3/uL (ref 4.0–10.5)

## 2015-03-18 LAB — BASIC METABOLIC PANEL
ANION GAP: 5 (ref 5–15)
BUN: 13 mg/dL (ref 6–20)
CHLORIDE: 108 mmol/L (ref 101–111)
CO2: 23 mmol/L (ref 22–32)
Calcium: 9.3 mg/dL (ref 8.9–10.3)
Creatinine, Ser: 1.05 mg/dL (ref 0.61–1.24)
GFR calc Af Amer: >60 mL/min (ref >60)
GFR calc non Af Amer: >60 mL/min (ref >60)
Glucose, Bld: 85 mg/dL (ref 65–99)
Potassium: 4 mmol/L (ref 3.5–5.1)
Sodium: 136 mmol/L (ref 135–145)

## 2015-03-18 LAB — SURGICAL PCR SCREEN
MRSA, PCR: NEGATIVE
Staphylococcus aureus: NEGATIVE

## 2015-03-18 NOTE — Progress Notes (Addendum)
EKG in epic from 10-31-14  Medical Md is Dr.Graham Damita Dunnings  Cardiologist note under media tab from 2013.[facility] and saw about a month or two ago=to request  Echo done within past 6 months-report under care everywhere  Stress test done about 2 months ago report under care everywhere  Heart cath done at [facility] in 2011  CXR

## 2015-03-18 NOTE — Progress Notes (Signed)
Anesthesia Chart Review:  Pt is 69 year old male scheduled for C4-5 ACDF on 03/24/2015 with Dr. Sherwood Gambler.   PMH includes: CAD (stent to LAD 2006; non STEMI and stent to RCA 2012), HTN, hyperlipidemia, peripheral edema, SOB with exertion. Current smoker. BMI 32.5. S/p L thumb nail removal, thumb nail reconstruction, partial excision distal phalanx with complex reconstruction 09/13/13.   Cardiologist is Dr. Saralyn Pilar, last office visit 11/19/2014.   Medications include: ASA, lasix, losartan, metoprolol.   Preoperative labs reviewed.    EKG 10/31/2014: Sinus bradycardia (56 bpm) with sinus arrhythmia. RBBB. T wave abnormality, consider lateral ischemia. When compared with ECG of 07-Oct-2011 13:47, no significant change was found by Dr. Donivan Scull interpretation.   Echo 11/15/2014: -normal LV systolic function with an estimated EF of 50-55%.  -normal RV systolic function -mild tricuspid and mitral valve insufficiency -no valvular stenosis -mild biatrial enlargement -mild septal hypertrophy  Nuclear stress test 11/15/2014: -normal myocardial thickening and wall motion -LVEF 58% -LV cavity normal -no evidence for scar or ischemia  Cardiac cath 10/07/2011: -A stent was performed on the 80% lesion in the proximal RCA and another stent was performed on the 99% of the distal RCA. Following intervention there was zero residual stenosis. The patient tolerated the procedure well. EF calculated by contrast ventriculography was 58%.   If no changes, I anticipate pt can proceed with surgery as scheduled.   Willeen Cass, FNP-BC [facility]/Anesthesiology Phone: [phone] 03/18/2015 4:20 PM

## 2015-03-18 NOTE — Progress Notes (Signed)
   03/18/15 1149  OBSTRUCTIVE SLEEP APNEA  Have you ever been diagnosed with sleep apnea through a sleep study? No  Do you snore loudly (loud enough to be heard through closed doors)?  1  Do you often feel tired, fatigued, or sleepy during the daytime? 1  Has anyone observed you stop breathing during your sleep? 1  Do you have, or are you being treated for high blood pressure? 1  BMI more than 35 kg/m2? 0  Age over 68 years old? 1  Neck circumference greater than 40 cm/16 inches? 1  Gender: 1

## 2015-03-18 NOTE — Pre-Procedure Instructions (Signed)
Matthew Norton  03/18/2015      [facility] [address], [place] [address] [place] [address] Phone: [phone] Fax: [phone]    Your procedure is scheduled on Mon, June 13 @ 7:30 AM  Report to [facility] Admitting at 5:30 AM  Call this number if you have problems the morning of surgery:  [phone]   Remember:  Do not eat food or drink liquids after midnight.  Take these medicines the morning of surgery with A SIP OF WATER:Valium(Diazepam),Pain Pill(if needed),Metoprolol(Lopressor),and Sertraline(Zoloft)              Stop taking your Aspirin. No Goody's,BC's.Ibuprofen,Fish Oil,or any Herbal Medications.    Do not wear jewelry.  Do not wear lotions, powders, or colognes.  You may wear deodorant.             Men may shave face and neck.  Do not bring valuables to the hospital.  [facility] is not responsible for any belongings or valuables.  Contacts, dentures or bridgework may not be worn into surgery.  Leave your suitcase in the car.  After surgery it may be brought to your room.  For patients admitted to the hospital, discharge time will be determined by your treatment team.  Patients discharged the day of surgery will not be allowed to drive home.    Special instructions:  [facility] - Preparing for Surgery  Before surgery, you can play an important role.  Because skin is not sterile, your skin needs to be as free of germs as possible.  You can reduce the number of germs on you skin by washing with CHG (chlorahexidine gluconate) soap before surgery.  CHG is an antiseptic cleaner which kills germs and bonds with the skin to continue killing germs even after washing.  Please DO NOT use if you have an allergy to CHG or antibacterial soaps.  If your skin becomes reddened/irritated stop using the CHG and inform your nurse when you arrive at Short Stay.  Do not shave (including legs and underarms) for at least 48 hours prior  to the first CHG shower.  You may shave your face.  Please follow these instructions carefully:   1.  Shower with CHG Soap the night before surgery and the                                morning of Surgery.  2.  If you choose to wash your hair, wash your hair first as usual with your       normal shampoo.  3.  After you shampoo, rinse your hair and body thoroughly to remove the                      Shampoo.  4.  Use CHG as you would any other liquid soap.  You can apply chg directly       to the skin and wash gently with scrungie or a clean washcloth.  5.  Apply the CHG Soap to your body ONLY FROM THE NECK DOWN.        Do not use on open wounds or open sores.  Avoid contact with your eyes,       ears, mouth and genitals (private parts).  Wash genitals (private parts)       with your normal soap.  6.  Wash thoroughly,  paying special attention to the area where your surgery        will be performed.  7.  Thoroughly rinse your body with warm water from the neck down.  8.  DO NOT shower/wash with your normal soap after using and rinsing off       the CHG Soap.  9.  Pat yourself dry with a clean towel.            10.  Wear clean pajamas.            11.  Place clean sheets on your bed the night of your first shower and do not        sleep with pets.  Day of Surgery  Do not apply any lotions/deoderants the morning of surgery.  Please wear clean clothes to the hospital/surgery center.    Please read over the following fact sheets that you were given. Pain Booklet, Coughing and Deep Breathing, MRSA Information and Surgical Site Infection Prevention

## 2015-03-24 ENCOUNTER — Observation Stay (HOSPITAL_COMMUNITY): Payer: Medicare HMO

## 2015-03-24 ENCOUNTER — Ambulatory Visit (HOSPITAL_COMMUNITY)
Admission: RE | Admit: 2015-03-24 | Discharge: 2015-03-26 | Disposition: A | Discharge status: Home / Self Care | Payer: Medicare HMO | Source: Ambulatory Visit | Attending: Neurosurgery | Admitting: Neurosurgery

## 2015-03-24 ENCOUNTER — Encounter (HOSPITAL_COMMUNITY)
Admission: RE | Disposition: A | Discharge status: Home / Self Care | Payer: Self-pay | Source: Ambulatory Visit | Attending: Neurosurgery

## 2015-03-24 ENCOUNTER — Encounter (HOSPITAL_COMMUNITY): Payer: Self-pay

## 2015-03-24 ENCOUNTER — Ambulatory Visit (HOSPITAL_COMMUNITY): Payer: Medicare HMO | Admitting: Anesthesiology

## 2015-03-24 ENCOUNTER — Ambulatory Visit (HOSPITAL_COMMUNITY): Payer: Medicare HMO | Admitting: Emergency Medicine

## 2015-03-24 DIAGNOSIS — Z6832 Body mass index (BMI) 32.0-32.9, adult: Secondary | ICD-10-CM | POA: Diagnosis not present

## 2015-03-24 DIAGNOSIS — Z888 Allergy status to other drugs, medicaments and biological substances status: Secondary | ICD-10-CM | POA: Diagnosis not present

## 2015-03-24 DIAGNOSIS — M5000 Cervical disc disorder with myelopathy, unspecified cervical region: Secondary | ICD-10-CM | POA: Diagnosis present

## 2015-03-24 DIAGNOSIS — Z419 Encounter for procedure for purposes other than remedying health state, unspecified: Secondary | ICD-10-CM

## 2015-03-24 DIAGNOSIS — F17210 Nicotine dependence, cigarettes, uncomplicated: Secondary | ICD-10-CM | POA: Diagnosis not present

## 2015-03-24 DIAGNOSIS — Z7982 Long term (current) use of aspirin: Secondary | ICD-10-CM | POA: Diagnosis not present

## 2015-03-24 DIAGNOSIS — E785 Hyperlipidemia, unspecified: Secondary | ICD-10-CM | POA: Diagnosis not present

## 2015-03-24 DIAGNOSIS — M5002 Cervical disc disorder with myelopathy, mid-cervical region: Secondary | ICD-10-CM | POA: Insufficient documentation

## 2015-03-24 DIAGNOSIS — M4712 Other spondylosis with myelopathy, cervical region: Secondary | ICD-10-CM | POA: Insufficient documentation

## 2015-03-24 DIAGNOSIS — I10 Essential (primary) hypertension: Secondary | ICD-10-CM | POA: Insufficient documentation

## 2015-03-24 DIAGNOSIS — R339 Retention of urine, unspecified: Secondary | ICD-10-CM | POA: Diagnosis not present

## 2015-03-24 DIAGNOSIS — I872 Venous insufficiency (chronic) (peripheral): Secondary | ICD-10-CM | POA: Diagnosis not present

## 2015-03-24 DIAGNOSIS — G8250 Quadriplegia, unspecified: Secondary | ICD-10-CM | POA: Insufficient documentation

## 2015-03-24 DIAGNOSIS — F419 Anxiety disorder, unspecified: Secondary | ICD-10-CM | POA: Insufficient documentation

## 2015-03-24 DIAGNOSIS — F329 Major depressive disorder, single episode, unspecified: Secondary | ICD-10-CM | POA: Diagnosis not present

## 2015-03-24 DIAGNOSIS — M1990 Unspecified osteoarthritis, unspecified site: Secondary | ICD-10-CM | POA: Diagnosis not present

## 2015-03-24 DIAGNOSIS — I2510 Atherosclerotic heart disease of native coronary artery without angina pectoris: Secondary | ICD-10-CM | POA: Insufficient documentation

## 2015-03-24 DIAGNOSIS — G9520 Unspecified cord compression: Secondary | ICD-10-CM | POA: Diagnosis not present

## 2015-03-24 HISTORY — PX: ANTERIOR CERVICAL DECOMP/DISCECTOMY FUSION: SHX1161

## 2015-03-24 SURGERY — ANTERIOR CERVICAL DECOMPRESSION/DISCECTOMY FUSION 1 LEVEL
Anesthesia: General

## 2015-03-24 MED ORDER — PHENOL 1.4 % MT LIQD
1.0000 | OROMUCOSAL | Status: DC | PRN
Start: 2015-03-24 — End: 2015-03-26
  Administered 2015-03-24: 1 via OROMUCOSAL
  Filled 2015-03-24: qty 177

## 2015-03-24 MED ORDER — HYDROCODONE-ACETAMINOPHEN 5-325 MG PO TABS: 1.0000 | ORAL_TABLET | ORAL | Status: DC | PRN

## 2015-03-24 MED ORDER — ACETAMINOPHEN 10 MG/ML IV SOLN
INTRAVENOUS | Status: AC
  Administered 2015-03-24: 1000 mg via INTRAVENOUS
  Filled 2015-03-24: qty 100

## 2015-03-24 MED ORDER — THROMBIN 5000 UNITS EX SOLR
CUTANEOUS | Status: DC | PRN
  Administered 2015-03-24: 09:00:00 via TOPICAL

## 2015-03-24 MED ORDER — LIDOCAINE-EPINEPHRINE 1 %-1:100000 IJ SOLN
INTRAMUSCULAR | Status: DC | PRN
  Administered 2015-03-24: 5 mL

## 2015-03-24 MED ORDER — DEXAMETHASONE SODIUM PHOSPHATE 10 MG/ML IJ SOLN
INTRAMUSCULAR | Status: DC | PRN
  Administered 2015-03-24: 10 mg via INTRAVENOUS

## 2015-03-24 MED ORDER — SODIUM CHLORIDE 0.9 % IJ SOLN
3.0000 mL | Freq: Two times a day (BID) | INTRAMUSCULAR | Status: DC
  Administered 2015-03-24 – 2015-03-25 (×3): 3 mL via INTRAVENOUS

## 2015-03-24 MED ORDER — ALUM & MAG HYDROXIDE-SIMETH 200-200-20 MG/5ML PO SUSP
30.0000 mL | Freq: Four times a day (QID) | ORAL | Status: DC | PRN
  Administered 2015-03-25 (×2): 30 mL via ORAL
  Filled 2015-03-24 (×2): qty 30

## 2015-03-24 MED ORDER — OXYCODONE HCL 5 MG PO TABS: 5.0000 mg | ORAL_TABLET | Freq: Once | ORAL | Status: DC | PRN

## 2015-03-24 MED ORDER — LOSARTAN POTASSIUM 25 MG PO TABS
25.0000 mg | ORAL_TABLET | Freq: Every day | ORAL | Status: DC
  Administered 2015-03-25 – 2015-03-26 (×2): 25 mg via ORAL
  Filled 2015-03-24 (×3): qty 1

## 2015-03-24 MED ORDER — SODIUM CHLORIDE 0.9 % IJ SOLN: 3.0000 mL | INTRAMUSCULAR | Status: DC | PRN

## 2015-03-24 MED ORDER — HYDROMORPHONE HCL 1 MG/ML IJ SOLN
0.2500 mg | INTRAMUSCULAR | Status: DC | PRN
  Administered 2015-03-24: 0.5 mg via INTRAVENOUS

## 2015-03-24 MED ORDER — LACTATED RINGERS IV SOLN
INTRAVENOUS | Status: DC | PRN
  Administered 2015-03-24 (×2): via INTRAVENOUS

## 2015-03-24 MED ORDER — ACETAMINOPHEN 160 MG/5ML PO SOLN
325.0000 mg | ORAL | Status: DC | PRN
  Filled 2015-03-24: qty 20.3

## 2015-03-24 MED ORDER — LIDOCAINE HCL 4 % MT SOLN
OROMUCOSAL | Status: DC | PRN
  Administered 2015-03-24: 4 mL via TOPICAL

## 2015-03-24 MED ORDER — BACITRACIN 50000 UNITS IM SOLR
INTRAMUSCULAR | Status: DC | PRN
  Administered 2015-03-24: 09:00:00

## 2015-03-24 MED ORDER — ACETAMINOPHEN 650 MG RE SUPP: 650.0000 mg | RECTAL | Status: DC | PRN

## 2015-03-24 MED ORDER — DIAZEPAM 5 MG PO TABS
5.0000 mg | ORAL_TABLET | Freq: Four times a day (QID) | ORAL | Status: DC | PRN
  Administered 2015-03-24 – 2015-03-25 (×2): 5 mg via ORAL
  Filled 2015-03-24: qty 1

## 2015-03-24 MED ORDER — ARTIFICIAL TEARS OP OINT
TOPICAL_OINTMENT | OPHTHALMIC | Status: DC | PRN
  Administered 2015-03-24: 1 via OPHTHALMIC

## 2015-03-24 MED ORDER — MIDAZOLAM HCL 5 MG/5ML IJ SOLN
INTRAMUSCULAR | Status: DC | PRN
  Administered 2015-03-24 (×2): 1 mg via INTRAVENOUS

## 2015-03-24 MED ORDER — KETOROLAC TROMETHAMINE 30 MG/ML IJ SOLN
30.0000 mg | Freq: Once | INTRAMUSCULAR | Status: AC
  Administered 2015-03-24: 30 mg via INTRAVENOUS

## 2015-03-24 MED ORDER — ONDANSETRON HCL 4 MG/2ML IJ SOLN
INTRAMUSCULAR | Status: DC | PRN
  Administered 2015-03-24: 4 mg via INTRAVENOUS

## 2015-03-24 MED ORDER — HYDROXYZINE HCL 25 MG PO TABS: 50.0000 mg | ORAL_TABLET | ORAL | Status: DC | PRN

## 2015-03-24 MED ORDER — ZOLPIDEM TARTRATE 5 MG PO TABS: 5.0000 mg | ORAL_TABLET | Freq: Every evening | ORAL | Status: DC | PRN

## 2015-03-24 MED ORDER — ONDANSETRON HCL 4 MG PO TABS: 4.0000 mg | ORAL_TABLET | Freq: Four times a day (QID) | ORAL | Status: DC | PRN

## 2015-03-24 MED ORDER — ROCURONIUM BROMIDE 100 MG/10ML IV SOLN
INTRAVENOUS | Status: DC | PRN
  Administered 2015-03-24: 40 mg via INTRAVENOUS
  Administered 2015-03-24: 10 mg via INTRAVENOUS

## 2015-03-24 MED ORDER — MIDAZOLAM HCL 2 MG/2ML IJ SOLN
INTRAMUSCULAR | Status: AC
  Filled 2015-03-24: qty 2

## 2015-03-24 MED ORDER — KETOROLAC TROMETHAMINE 30 MG/ML IJ SOLN
30.0000 mg | Freq: Four times a day (QID) | INTRAMUSCULAR | Status: AC
  Administered 2015-03-24 – 2015-03-26 (×8): 30 mg via INTRAVENOUS
  Filled 2015-03-24 (×8): qty 1

## 2015-03-24 MED ORDER — METOPROLOL TARTRATE 25 MG PO TABS
25.0000 mg | ORAL_TABLET | Freq: Every morning | ORAL | Status: DC
  Administered 2015-03-25 – 2015-03-26 (×2): 25 mg via ORAL
  Filled 2015-03-24 (×2): qty 1

## 2015-03-24 MED ORDER — ONDANSETRON HCL 4 MG/2ML IJ SOLN: 4.0000 mg | Freq: Four times a day (QID) | INTRAMUSCULAR | Status: DC | PRN

## 2015-03-24 MED ORDER — FENTANYL CITRATE (PF) 100 MCG/2ML IJ SOLN
INTRAMUSCULAR | Status: DC | PRN
  Administered 2015-03-24 (×5): 50 ug via INTRAVENOUS

## 2015-03-24 MED ORDER — MENTHOL 3 MG MT LOZG
1.0000 | LOZENGE | OROMUCOSAL | Status: DC | PRN
  Filled 2015-03-24: qty 9

## 2015-03-24 MED ORDER — CEFAZOLIN SODIUM-DEXTROSE 2-3 GM-% IV SOLR
2.0000 g | INTRAVENOUS | Status: AC
  Administered 2015-03-24: 2 g via INTRAVENOUS

## 2015-03-24 MED ORDER — MAGNESIUM HYDROXIDE 400 MG/5ML PO SUSP: 30.0000 mL | Freq: Every day | ORAL | Status: DC | PRN

## 2015-03-24 MED ORDER — SODIUM CHLORIDE 0.9 % IV SOLN: 250.0000 mL | INTRAVENOUS | Status: DC

## 2015-03-24 MED ORDER — BUPIVACAINE HCL (PF) 0.5 % IJ SOLN
INTRAMUSCULAR | Status: DC | PRN
  Administered 2015-03-24: 5 mL

## 2015-03-24 MED ORDER — HYDROXYZINE HCL 50 MG/ML IM SOLN
50.0000 mg | INTRAMUSCULAR | Status: DC | PRN
  Filled 2015-03-24: qty 1

## 2015-03-24 MED ORDER — ACETAMINOPHEN 325 MG PO TABS: 650.0000 mg | ORAL_TABLET | ORAL | Status: DC | PRN

## 2015-03-24 MED ORDER — EPHEDRINE SULFATE 50 MG/ML IJ SOLN
INTRAMUSCULAR | Status: DC | PRN
  Administered 2015-03-24 (×4): 10 mg via INTRAVENOUS

## 2015-03-24 MED ORDER — KCL IN DEXTROSE-NACL 20-5-0.45 MEQ/L-%-% IV SOLN
INTRAVENOUS | Status: DC
  Administered 2015-03-24: 13:00:00 via INTRAVENOUS
  Filled 2015-03-24 (×6): qty 1000

## 2015-03-24 MED ORDER — VECURONIUM BROMIDE 10 MG IV SOLR
INTRAVENOUS | Status: DC | PRN
  Administered 2015-03-24: 2 mg via INTRAVENOUS
  Administered 2015-03-24: 1 mg via INTRAVENOUS

## 2015-03-24 MED ORDER — ACETAMINOPHEN 325 MG PO TABS: 325.0000 mg | ORAL_TABLET | ORAL | Status: DC | PRN

## 2015-03-24 MED ORDER — OXYCODONE-ACETAMINOPHEN 5-325 MG PO TABS
1.0000 | ORAL_TABLET | ORAL | Status: DC | PRN
  Administered 2015-03-24: 1 via ORAL
  Administered 2015-03-24: 2 via ORAL
  Administered 2015-03-25: 1 via ORAL
  Administered 2015-03-25: 2 via ORAL
  Filled 2015-03-24 (×2): qty 2
  Filled 2015-03-24: qty 1

## 2015-03-24 MED ORDER — OXYCODONE HCL 5 MG/5ML PO SOLN: 5.0000 mg | Freq: Once | ORAL | Status: DC | PRN

## 2015-03-24 MED ORDER — KETOROLAC TROMETHAMINE 30 MG/ML IJ SOLN
INTRAMUSCULAR | Status: AC
  Filled 2015-03-24: qty 1

## 2015-03-24 MED ORDER — BISACODYL 10 MG RE SUPP: 10.0000 mg | Freq: Every day | RECTAL | Status: DC | PRN

## 2015-03-24 MED ORDER — NEOSTIGMINE METHYLSULFATE 10 MG/10ML IV SOLN
INTRAVENOUS | Status: DC | PRN
  Administered 2015-03-24: 4 mg via INTRAVENOUS

## 2015-03-24 MED ORDER — LIDOCAINE HCL (CARDIAC) 20 MG/ML IV SOLN
INTRAVENOUS | Status: DC | PRN
  Administered 2015-03-24: 80 mg via INTRAVENOUS

## 2015-03-24 MED ORDER — DIAZEPAM 5 MG PO TABS
ORAL_TABLET | ORAL | Status: AC
  Filled 2015-03-24: qty 1

## 2015-03-24 MED ORDER — SERTRALINE HCL 50 MG PO TABS
150.0000 mg | ORAL_TABLET | Freq: Every day | ORAL | Status: DC
  Administered 2015-03-24 – 2015-03-25 (×2): 150 mg via ORAL
  Filled 2015-03-24 (×3): qty 1

## 2015-03-24 MED ORDER — PROPOFOL 10 MG/ML IV BOLUS
INTRAVENOUS | Status: AC
  Filled 2015-03-24: qty 20

## 2015-03-24 MED ORDER — NITROGLYCERIN 0.4 MG SL SUBL: 0.4000 mg | SUBLINGUAL_TABLET | SUBLINGUAL | Status: DC | PRN

## 2015-03-24 MED ORDER — THROMBIN 5000 UNITS EX SOLR
CUTANEOUS | Status: DC | PRN
  Administered 2015-03-24 (×2): 5000 [IU] via TOPICAL

## 2015-03-24 MED ORDER — GLYCOPYRROLATE 0.2 MG/ML IJ SOLN
INTRAMUSCULAR | Status: DC | PRN
  Administered 2015-03-24: 0.6 mg via INTRAVENOUS

## 2015-03-24 MED ORDER — FENTANYL CITRATE (PF) 250 MCG/5ML IJ SOLN
INTRAMUSCULAR | Status: AC
  Filled 2015-03-24: qty 5

## 2015-03-24 MED ORDER — NICOTINE 14 MG/24HR TD PT24
14.0000 mg | MEDICATED_PATCH | Freq: Every day | TRANSDERMAL | Status: DC
  Administered 2015-03-24 – 2015-03-26 (×3): 14 mg via TRANSDERMAL
  Filled 2015-03-24 (×3): qty 1

## 2015-03-24 MED ORDER — HYDROMORPHONE HCL 1 MG/ML IJ SOLN
INTRAMUSCULAR | Status: AC
  Filled 2015-03-24: qty 1

## 2015-03-24 MED ORDER — PROPOFOL 10 MG/ML IV BOLUS
INTRAVENOUS | Status: DC | PRN
  Administered 2015-03-24: 30 mg via INTRAVENOUS
  Administered 2015-03-24: 170 mg via INTRAVENOUS

## 2015-03-24 MED ORDER — MORPHINE SULFATE 4 MG/ML IJ SOLN: 4.0000 mg | INTRAMUSCULAR | Status: DC | PRN

## 2015-03-24 MED ORDER — 0.9 % SODIUM CHLORIDE (POUR BTL) OPTIME
TOPICAL | Status: DC | PRN
  Administered 2015-03-24: 1000 mL

## 2015-03-24 MED ORDER — HEMOSTATIC AGENTS (NO CHARGE) OPTIME
TOPICAL | Status: DC | PRN
  Administered 2015-03-24: 1 via TOPICAL

## 2015-03-24 MED ORDER — PHENYLEPHRINE HCL 10 MG/ML IJ SOLN
INTRAMUSCULAR | Status: DC | PRN
  Administered 2015-03-24 (×3): 40 ug via INTRAVENOUS
  Administered 2015-03-24: 80 ug via INTRAVENOUS
  Administered 2015-03-24: 40 ug via INTRAVENOUS

## 2015-03-24 SURGICAL SUPPLY — 60 items
BAG DECANTER FOR FLEXI CONT (MISCELLANEOUS) ×4 IMPLANT
BIT DRILL NEURO 2X3.1 SFT TUCH (MISCELLANEOUS) ×1 IMPLANT
BLADE ULTRA TIP 2M (BLADE) ×2 IMPLANT
BRUSH SCRUB EZ PLAIN DRY (MISCELLANEOUS) ×2 IMPLANT
CANISTER SUCT 3000ML PPV (MISCELLANEOUS) ×2 IMPLANT
CONT SPEC 4OZ CLIKSEAL STRL BL (MISCELLANEOUS) ×2 IMPLANT
COVER MAYO STAND STRL (DRAPES) ×2 IMPLANT
DECANTER SPIKE VIAL GLASS SM (MISCELLANEOUS) ×2 IMPLANT
DERMABOND ADHESIVE PROPEN (GAUZE/BANDAGES/DRESSINGS) ×1
DERMABOND ADVANCED .7 DNX6 (GAUZE/BANDAGES/DRESSINGS) ×1 IMPLANT
DRAPE LAPAROTOMY 100X72 PEDS (DRAPES) ×2 IMPLANT
DRAPE MICROSCOPE LEICA (MISCELLANEOUS) ×2 IMPLANT
DRAPE POUCH INSTRU U-SHP 10X18 (DRAPES) ×2 IMPLANT
DRAPE PROXIMA HALF (DRAPES) IMPLANT
DRILL NEURO 2X3.1 SOFT TOUCH (MISCELLANEOUS) ×2
ELECT COATED BLADE 2.86 ST (ELECTRODE) ×2 IMPLANT
ELECT REM PT RETURN 9FT ADLT (ELECTROSURGICAL) ×2
ELECTRODE REM PT RTRN 9FT ADLT (ELECTROSURGICAL) ×1 IMPLANT
GLOVE BIO SURGEON STRL SZ7 (GLOVE) ×4 IMPLANT
GLOVE BIOGEL PI IND STRL 7.0 (GLOVE) ×1 IMPLANT
GLOVE BIOGEL PI IND STRL 8 (GLOVE) ×1 IMPLANT
GLOVE BIOGEL PI INDICATOR 7.0 (GLOVE) ×1
GLOVE BIOGEL PI INDICATOR 8 (GLOVE) ×1
GLOVE ECLIPSE 7.5 STRL STRAW (GLOVE) ×4 IMPLANT
GLOVE EXAM NITRILE LRG STRL (GLOVE) IMPLANT
GLOVE EXAM NITRILE MD LF STRL (GLOVE) IMPLANT
GLOVE EXAM NITRILE XL STR (GLOVE) IMPLANT
GLOVE EXAM NITRILE XS STR PU (GLOVE) IMPLANT
GLOVE INDICATOR 7.5 STRL GRN (GLOVE) ×2 IMPLANT
GLOVE INDICATOR 8.0 STRL GRN (GLOVE) ×2 IMPLANT
GLOVE SS BIOGEL STRL SZ 7 (GLOVE) IMPLANT
GLOVE SUPERSENSE BIOGEL SZ 7 (GLOVE)
GOWN STRL REUS W/ TWL LRG LVL3 (GOWN DISPOSABLE) IMPLANT
GOWN STRL REUS W/ TWL XL LVL3 (GOWN DISPOSABLE) ×2 IMPLANT
GOWN STRL REUS W/TWL 2XL LVL3 (GOWN DISPOSABLE) IMPLANT
GOWN STRL REUS W/TWL LRG LVL3 (GOWN DISPOSABLE)
GOWN STRL REUS W/TWL XL LVL3 (GOWN DISPOSABLE) ×2
GRAFT CORT CANC 14X8.25X11 5D (Bone Implant) ×2 IMPLANT
HALTER HD/CHIN CERV TRACTION D (MISCELLANEOUS) ×2 IMPLANT
HEMOSTAT POWDER KIT SURGIFOAM (HEMOSTASIS) ×2 IMPLANT
KIT BASIN OR (CUSTOM PROCEDURE TRAY) ×2 IMPLANT
KIT ROOM TURNOVER OR (KITS) ×2 IMPLANT
NEEDLE HYPO 25X1 1.5 SAFETY (NEEDLE) ×2 IMPLANT
NEEDLE SPNL 22GX3.5 QUINCKE BK (NEEDLE) ×4 IMPLANT
NS IRRIG 1000ML POUR BTL (IV SOLUTION) ×2 IMPLANT
PACK LAMINECTOMY NEURO (CUSTOM PROCEDURE TRAY) ×2 IMPLANT
PAD ARMBOARD 7.5X6 YLW CONV (MISCELLANEOUS) ×6 IMPLANT
PLATE CERVICAL 14MM (Plate) ×2 IMPLANT
RUBBERBAND STERILE (MISCELLANEOUS) ×4 IMPLANT
SCREW FIX 4.0X15MM (Screw) ×4 IMPLANT
SCREW VAR 4.0X16MM (Screw) ×4 IMPLANT
SPONGE INTESTINAL PEANUT (DISPOSABLE) ×2 IMPLANT
SPONGE SURGIFOAM ABS GEL SZ50 (HEMOSTASIS) ×2 IMPLANT
STAPLER SKIN PROX WIDE 3.9 (STAPLE) IMPLANT
SUT VIC AB 2-0 CP2 18 (SUTURE) ×2 IMPLANT
SUT VIC AB 3-0 SH 8-18 (SUTURE) ×2 IMPLANT
SYR 20ML ECCENTRIC (SYRINGE) ×2 IMPLANT
TOWEL OR 17X24 6PK STRL BLUE (TOWEL DISPOSABLE) ×2 IMPLANT
TOWEL OR 17X26 10 PK STRL BLUE (TOWEL DISPOSABLE) ×2 IMPLANT
WATER STERILE IRR 1000ML POUR (IV SOLUTION) ×2 IMPLANT

## 2015-03-24 NOTE — Progress Notes (Signed)
Filed Vitals:   03/24/15 1108 03/24/15 1225 03/24/15 1616 03/24/15 1900  BP: 126/62 158/70 121/54 118/57  Pulse: 59 63 64 64  Temp: 98.3 F (36.8 C) 97.8 F (36.6 C) 98.3 F (36.8 C) 98.4 F (36.9 C)  Resp: 20 18 16 18   Height:      Weight:      SpO2: 98% 98% 96% 96%    Patient resting comfortably in bed. Wound clean and dry. Was voiding small volumes, had PVR of over 650 mL, in and out catheterized by staff. Has been up and ambulating in the halls twice. Patient feels strength and legs is improved. We'll reassess in a.m. to make assessment of feasibility of returning home versus rehabilitation at [facility].  Plan: Continue to progress through postoperative recovery.  Hosie Spangle, MD 03/24/2015, 8:24 PM

## 2015-03-24 NOTE — Anesthesia Preprocedure Evaluation (Addendum)
Anesthesia Evaluation  Patient identified by MRN, date of birth, ID band Patient awake    Reviewed: Allergy & Precautions, NPO status , Patient's Chart, lab work & pertinent test results, reviewed documented beta blocker date and time   History of Anesthesia Complications Negative for: history of anesthetic complications  Airway Mallampati: III  TM Distance: >3 FB Neck ROM: Full    Dental  (+) Teeth Intact, Chipped,    Pulmonary neg shortness of breath, neg sleep apnea, neg COPDneg recent URI, Current Smoker,  breath sounds clear to auscultation        Cardiovascular hypertension, Pt. on medications and Pt. on home beta blockers - angina+ CAD, + Cardiac Stents and + Peripheral Vascular Disease - Past MI - dysrhythmias Rhythm:Regular     Neuro/Psych PSYCHIATRIC DISORDERS Anxiety Depression negative neurological ROS     GI/Hepatic negative GI ROS, Neg liver ROS,   Endo/Other  Morbid obesity  Renal/GU negative Renal ROS     Musculoskeletal  (+) Arthritis -,   Abdominal   Peds  Hematology negative hematology ROS (+)   Anesthesia Other Findings   Reproductive/Obstetrics                           Anesthesia Physical Anesthesia Plan  ASA: III  Anesthesia Plan: General   Post-op Pain Management:    Induction: Intravenous  Airway Management Planned: Oral ETT  Additional Equipment: None  Intra-op Plan:   Post-operative Plan: Extubation in OR  Informed Consent: I have reviewed the patients History and Physical, chart, labs and discussed the procedure including the risks, benefits and alternatives for the proposed anesthesia with the patient or authorized representative who has indicated his/her understanding and acceptance.   Dental advisory given  Plan Discussed with: CRNA and Surgeon  Anesthesia Plan Comments:         Anesthesia Quick Evaluation

## 2015-03-24 NOTE — Op Note (Signed)
03/24/2015  9:54 AM  PATIENT:  Matthew Norton  69 y.o. 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  ANESTHESIA:   general  EBL:  Total I/O In: 1000 [I.V.:1000] Out: -   BLOOD ADMINISTERED:none  COUNT: Correct per nursing staff  DICTATION: Patient was brought to the operating room placed under general endotracheal anesthesia. Patient was placed in 10 pounds of halter traction. The neck was prepped with Betadine soap and solution and draped in a sterile fashion. A horizontal incision was made on the left side of the neck. The line of the incision was infiltrated with local anesthetic with epinephrine. Dissection was carried down thru the subcutaneous tissue and platysma, bipolar cautery was used to maintain hemostasis. Dissection was then carried down thru an avascular plane leaving the sternocleidomastoid carotid artery and jugular vein laterally and the trachea and esophagus medially. The ventral aspect of the vertebral column was identified and a localizing x-ray was taken. The C4-5 level was identified. The annulus was incised and the disc space entered. Discectomy was performed with micro-curettes and pituitary rongeurs. The operating microscope was draped and brought into the field provided additional magnification illumination and visualization. Discectomy was continued posteriorly thru the disc space and then the cartilaginous endplate was removed using micro-curettes along with the high-speed drill. Posterior  osteophytic overgrowth was removed using the high-speed drill along with a 2 mm thin footplated Kerrison punch. Posterior longitudinal ligament along with disc herniation was carefully removed, decompressing the spinal canal and thecal sac. We then continued to remove osteophytic overgrowth and disc material decompressing the neural foramina and exiting nerve roots bilaterally. Once the decompression was completed hemostasis was established with the use of Gelfoam with thrombin and Surgifoam. Hemostasis was confirmed. We then measured the height of the intravertebral disc space and selected a 8 millimeter in height structural allograft. It was hydrated and saline solution and then gently positioned in the intravertebral disc space and countersunk. We then selected a 14 millimeter in height Tether cervical plate. It was positioned over the fusion construct and secured to the vertebra with 16 x 4 mm variable screws at the C4 level, and 15 x 4 mm fixed screws at the C5 level. Each screw hole was started with the high-speed drill and then the screws placed once all the screws were placed final tightening was performed. The wound was irrigated with bacitracin solution checked for hemostasis which was established and confirmed. An x-ray was taken which showed grafts in good position, the plate and screws in good position, the overall alignment to be good . We then proceeded with closure. The platysma was closed with interrupted inverted 2-0 undyed Vicryl suture, the subcutaneous and subcuticular closed with interrupted inverted 3-0 undyed Vicryl suture. The skin edges were approximated with Dermabond. Following surgery the patient was taken out of cervical traction. To be reversed and the anesthetic and taken to the recovery room for further care.  PLAN OF CARE: Admit for overnight observation  PATIENT DISPOSITION:  PACU - hemodynamically stable.   Delay start of Pharmacological VTE agent (>24hrs) due to surgical  blood loss or risk of bleeding:  yes

## 2015-03-24 NOTE — Anesthesia Postprocedure Evaluation (Signed)
  Anesthesia Post-op Note  Patient: Matthew Norton  Procedure(s) Performed: Procedure(s) with comments: Cervical four- five, anterior cervical decompression with fusion, plating and bonegraft (N/A) - C45 anterior cervical decompression with fusion plating and bonegraft  Patient Location: PACU  Anesthesia Type:General  Level of Consciousness: awake  Airway and Oxygen Therapy: Patient Spontanous Breathing  Post-op Pain: mild  Post-op Assessment: Post-op Vital signs reviewed, Patient's Cardiovascular Status Stable, Respiratory Function Stable, Patent Airway, No signs of Nausea or vomiting and Pain level controlled              Post-op Vital Signs: Reviewed and stable  Last Vitals:  Filed Vitals:   03/24/15 1108  BP: 126/62  Pulse: 59  Temp: 36.8 C  Resp: 20    Complications: No apparent anesthesia complications

## 2015-03-24 NOTE — Anesthesia Procedure Notes (Signed)
Procedure Name: Intubation Date/Time: 03/24/2015 7:51 AM Performed by: Suzy Bouchard Pre-anesthesia Checklist: Patient identified, Timeout performed, Emergency Drugs available, Suction available and Patient being monitored Patient Re-evaluated:Patient Re-evaluated prior to inductionOxygen Delivery Method: Circle system utilized Preoxygenation: Pre-oxygenation with 100% oxygen Intubation Type: IV induction Ventilation: Oral airway inserted - appropriate to patient size and Mask ventilation without difficulty Laryngoscope Size: Miller and 2 Grade View: Grade I Tube type: Oral Tube size: 7.5 mm Number of attempts: 1 Airway Equipment and Method: Stylet and LTA kit utilized Placement Confirmation: ETT inserted through vocal cords under direct vision,  breath sounds checked- equal and bilateral and positive ETCO2 Secured at: 21 cm Tube secured with: Tape Dental Injury: Teeth and Oropharynx as per pre-operative assessment

## 2015-03-24 NOTE — H&P (Signed)
Subjective: Patient is a 69 y.o. right handed black male who is admitted for treatment of cervical disc herniation with spinal cord compression and chronic myelopathy with quadriparesis.  Patient been found to have advanced degeneration at the C4-5 level with cervical spondylosis and cervical degenerative disease, with superimposed cervical disc herniation and resulting spinal cord compression. MRI in February 2007 showed myelopathic changes at this level.  Patient admitted now for single level C4-5 anterior cervical decompression and arthrodesis.   Patient Active Problem List   Diagnosis Date Noted  . CAD in native artery 10/27/2014  . HTN (hypertension) 10/27/2014  . HLD (hyperlipidemia) 10/27/2014  . Parotid mass 10/27/2014  . Spinal stenosis in cervical region 10/27/2014  . Chronic venous insufficiency 10/27/2014  . Anxiety and depression 10/27/2014   Past Medical History  Diagnosis Date  . Coronary artery disease   . Arthritis   . Hyperlipemia     has tried meds but not on any bc of joint aches and pains  . Anxiety     takes Valium daily as needed  . Depression     takes Zoloft daily  . Hypertension     takes Metoprolol and Losartan daily  . Complication of anesthesia     slow to wake up after one surgery  . Peripheral edema     takes Furosemide daily as needed  . Shortness of breath     with exertion   . Weakness     numbness in both hands and feet.  . Joint pain   . Cataracts, bilateral   . Insomnia     doesn't take any meds    Past Surgical History  Procedure Laterality Date  . Cardiac catheterization  08,11    stents x2  . Clavicle surgery  1965    fx rt   . Back surgery  2001    lumb lam  . Hernia repair  2007    rt and lt ing   . Replantation thumb  2003    left  . Nailbed repair Left 09/13/2013    Procedure: LEFT THUMB NAIL REMOVAL, NAIL BED RECONSTRUCTION PARTIAL EXCISION DISTAL PHALANX WITH COMPLEX RECONSTRUCTION ;  Surgeon: Roseanne Kaufman, MD;   Location: [facility];  Service: Orthopedics;  Laterality: Left;  . Coronary angioplasty      2 stents  . Esophagogastroduodenoscopy    . Parotid mass removed      Prescriptions prior to admission  Medication Sig Dispense Refill Last Dose  . aspirin 81 MG tablet Take 81 mg by mouth daily.   Past Month at Unknown time  . diazepam (VALIUM) 5 MG tablet TAKE ONE TABLET BY MOUTH EVERY 12 HOURS AS NEEDED FOR ANXIETY 60 tablet 0 03/24/2015 at Unknown time  . metoprolol tartrate (LOPRESSOR) 25 MG tablet Take 1 tablet (25 mg total) by mouth every morning. 30 tablet 11 03/24/2015 at 0400  . nitroGLYCERIN (NITROSTAT) 0.4 MG SL tablet Place under the tongue every 5 (five) minutes as needed for chest pain.    Taking  . sertraline (ZOLOFT) 100 MG tablet Take 2 tablets (200 mg total) by mouth daily. (Patient taking differently: Take 150 mg by mouth daily. ) 60 tablet 2 03/23/2015 at Unknown time  . furosemide (LASIX) 20 MG tablet Take 20 mg by mouth daily as needed for fluid.    More than a month at Unknown time  . HYDROcodone-acetaminophen (NORCO/VICODIN) 5-325 MG per tablet Take 1-2 tablets by mouth every 6 (six) hours as  needed for moderate pain.    More than a month at Unknown time  . losartan (COZAAR) 25 MG tablet Take 25 mg by mouth daily.   Unknown at Unknown time   Allergies  Allergen Reactions  . Bupropion Other (See Comments)    Unspecified  . Flexeril [Cyclobenzaprine] Other (See Comments)    Likely sedation reaction  . Hydrochlorothiazide Other (See Comments)    Unspecified  . Paroxetine Hcl Other (See Comments)    Unspecified  . Pravastatin Other (See Comments)    nightmares  . Venlafaxine Other (See Comments)    Unspecified    History  Substance Use Topics  . Smoking status: Current Every Day Smoker -- 1.00 packs/day for 50 years  . Smokeless tobacco: Not on file  . Alcohol Use: No    Family History  Problem Relation Age of Onset  . Hypertension Mother   . Cancer  Mother   . Colon cancer Neg Hx   . Prostate cancer Neg Hx      Review of Systems A comprehensive review of systems was negative.  Objective: Vital signs in last 24 hours: Weight:  [99.791 kg (220 lb)] 99.791 kg (220 lb) (06/13 5188)  EXAM: Patient is a well-developed well-nourished black male in no acute distress. Lungs are clear to auscultation , the patient has symmetrical respiratory excursion. Heart has a regular rate and rhythm normal S1 and S2 no murmur.   Abdomen is soft nontender nondistended bowel sounds are present. Extremity examination shows no clubbing cyanosis or edema. Motor examination shows the right upper extremity has 5/5 strength in the deltoid, biceps, triceps, intrinsics, and grip. However the left upper extremity shows the deltoid is 5, the biceps and triceps are 4-4+/5. The intrinsics are 4, with some evidence of atrophy in the dorsal interossei, and the grip is 5. The lower extremities, iliopsoas is 3 bilaterally. The remainder the lower extremity strength is 5/5 in the quadriceps, dorsiflexor, EHL, and plantar flexor bilaterally. Sensation is intact to pinprick in the digits the upper extremities. Reflexes are absent in the biceps and brachialis. The triceps are 2. Quadriceps are 2-3. They're all symmetrical bilaterally. The gastrocnemius on the left is absent on the right is 1. Toes are equivocal to downgoing bilaterally. Gait and stance are mildly unsteady.  Data Review:CBC    Component Value Date/Time   WBC 4.5 03/18/2015 1152   WBC 5.8 10/31/2014 1043   RBC 4.67 03/18/2015 1152   RBC 4.99 10/31/2014 1043   HGB 14.5 03/18/2015 1152   HGB 15.6 10/31/2014 1043   HCT 41.8 03/18/2015 1152   HCT 47.4 10/31/2014 1043   PLT 181 03/18/2015 1152   PLT 209 10/31/2014 1043   MCV 89.5 03/18/2015 1152   MCV 95 10/31/2014 1043   MCH 31.0 03/18/2015 1152   MCH 31.2 10/31/2014 1043   MCHC 34.7 03/18/2015 1152   MCHC 32.9 10/31/2014 1043   RDW 13.5 03/18/2015 1152   RDW  13.9 10/31/2014 1043   LYMPHSABS 2.1 10/31/2014 1043   MONOABS 0.5 10/31/2014 1043   EOSABS 0.1 10/31/2014 1043   BASOSABS 0.0 10/31/2014 1043                          BMET    Component Value Date/Time   NA 136 03/18/2015 1152   K 4.0 03/18/2015 1152   CL 108 03/18/2015 1152   CO2 23 03/18/2015 1152   GLUCOSE 85 03/18/2015 1152  BUN 13 03/18/2015 1152   CREATININE 1.05 03/18/2015 1152   CALCIUM 9.3 03/18/2015 1152   GFRNONAA >60 03/18/2015 1152   GFRAA >60 03/18/2015 1152     Assessment/Plan: Patient with quadriparesis, with spinal cord compression at the C4-5 level and evidence of increased signal, at that level (which has been present since at least February 2007). However the patient has had increasing pain, numbness, and weakness, and is admitted now for a single level C-5 ACDF.  I've discussed with the patient the nature of his condition, the nature the surgical procedure, the typical length of surgery, hospital stay, and overall recuperation. We discussed limitations postoperatively. I discussed risks of surgery including risks of infection, bleeding, possibly need for transfusion, the risk of nerve root dysfunction with pain, weakness, numbness, or paresthesias, the risk of spinal cord dysfunction with paralysis of all 4 limbs and quadriplegia, and the risk of dural tear and CSF leakage and possible need for further surgery, the risk of esophageal dysfunction causing dysphagia and the risk of laryngeal dysfunction causing hoarseness of the voice, the risk of failure of the arthrodesis and the possible need for further surgery, and the risk of anesthetic complications including myocardial infarction, stroke, pneumonia, and death. We also discussed the need for postoperative immobilization in a cervical collar. Understanding all this the patient does wish to proceed with surgery and is admitted for such.   Hosie Spangle, MD 03/24/2015 7:25 AM

## 2015-03-24 NOTE — Transfer of Care (Signed)
Immediate Anesthesia Transfer of Care Note  Patient: Matthew Norton  Procedure(s) Performed: Procedure(s) with comments: Cervical four- five, anterior cervical decompression with fusion, plating and bonegraft (N/A) - C45 anterior cervical decompression with fusion plating and bonegraft  Patient Location: PACU  Anesthesia Type:General  Level of Consciousness: awake and alert   Airway & Oxygen Therapy: Patient Spontanous Breathing and Patient connected to nasal cannula oxygen  Post-op Assessment: Report given to RN, Post -op Vital signs reviewed and stable and Patient moving all extremities  Post vital signs: Reviewed and stable  Last Vitals: There were no vitals filed for this visit.  Complications: No apparent anesthesia complications

## 2015-03-25 ENCOUNTER — Encounter (HOSPITAL_COMMUNITY): Payer: Self-pay | Admitting: Neurosurgery

## 2015-03-25 DIAGNOSIS — M5002 Cervical disc disorder with myelopathy, mid-cervical region: Secondary | ICD-10-CM | POA: Diagnosis not present

## 2015-03-25 NOTE — Progress Notes (Signed)
PT Cancellation and Discharge Note  Patient Details Name: GLEN KESINGER MRN: [id number] DOB: 1946-08-17   Cancelled Treatment:    Reason Eval/Treat Not Completed: PT screened, no needs identified, will sign off.  Pt seen by OT and able to ambulate independently, perform stairs, and demonstrates good overall safety with mobility.  Per OT, no PT needs at this time, will sign off.     Raylen Ken, Thornton Papas 03/25/2015, 10:33 AM

## 2015-03-25 NOTE — Progress Notes (Signed)
Filed Vitals:   03/24/15 1900 03/25/15 0018 03/25/15 0436 03/25/15 0738  BP: 118/57 124/69 139/70 124/67  Pulse: 64 70  66  Temp: 98.4 F (36.9 C) 98.1 F (36.7 C) 99 F (37.2 C) 98.5 F (36.9 C)  Resp: 18 18 20 18   Height:      Weight:      SpO2: 96% 96% 94% 98%    Patient sitting up eating breakfast. Mild dysphagia. Wound clean and dry. Voiding better, but still some PVR. Ambulating in the halls with staff, but mild unsteadiness when turning.  Considering rehabilitation at [facility]. We'll consult PT and OT, as well as social work.  Plan: Continue to progress through postoperative recovery.  Hosie Spangle, MD 03/25/2015, 8:42 AM

## 2015-03-25 NOTE — Evaluation (Signed)
Occupational Therapy Evaluation and Discharge Patient Details Name: Matthew Norton MRN: [id number] DOB: Jan 02, 1946 Today's Date: 03/25/2015    History of Present Illness Patient is a 69 y.o. right handed black male who is admitted for treatment of cervical disc herniation with spinal cord compression and chronic myelopathy with quadriparesis. Patient been found to have advanced degeneration at the C4-5 level with cervical spondylosis and cervical degenerative disease, with superimposed cervical disc herniation and resulting spinal cord compression. Now s/p C4-5 decompression and fusion.    Clinical Impression   This 69 yo male admitted and underwent above presents to acute OT at a Mod I level for BADLs and mobility.No further OT needs or PT needs identified. Pt did ask about sitting in a chair in his garden and supervising work in his garden--I told him this was fine, but that he could not work in it. I told him to use his cane when he is out and about. I also advised him to wear his collar when he is not eating or sleeping due to the fact that he does not have pain and thus he is tending to move his neck too much this soon after surgery--he verbalized understanding.    Follow Up Recommendations  No OT follow up    Equipment Recommendations  None recommended by OT       Precautions / Restrictions Precautions Precautions: None Restrictions Weight Bearing Restrictions: No      Mobility Bed Mobility               General bed mobility comments: Pt up in recliner upon arrival  Transfers Overall transfer level: Modified independent Equipment used: None             General transfer comment: Ambulate the entire unit x2 and did 2 steps without rail with "step to" pattern         ADL Overall ADL's : Modified independent                                       General ADL Comments: Went over using two cups when brushing teeth (one with straw for rinising and  then a rinse cup to spit in). He will use a reacher and sock aid for LBD--which I issued to him     Vision Additional Comments: No change from baseline          Pertinent Vitals/Pain Pain Assessment: No/denies pain     Hand Dominance Right   Extremity/Trunk Assessment Upper Extremity Assessment Upper Extremity Assessment: Overall WFL for tasks assessed   Lower Extremity Assessment Lower Extremity Assessment: Overall WFL for tasks assessed (does have bad knees)       Communication Communication Communication: No difficulties   Cognition Arousal/Alertness: Awake/alert Behavior During Therapy: WFL for tasks assessed/performed Overall Cognitive Status: Within Functional Limits for tasks assessed                                Home Living Family/patient expects to be discharged to:: Private residence Living Arrangements: Spouse/significant other Available Help at Discharge: Friend(s);Available 24 hours/day Type of Home: House Home Access: Stairs to enter [employer] of Steps: 1 and then 1 Entrance Stairs-Rails: None Home Layout: One level     Bathroom Shower/Tub: Tub/shower unit;Curtain Shower/tub characteristics: Architectural technologist: Standard     Home Equipment:  Shower seat;Cane - single point          Prior Functioning/Environment Level of Independence: Independent             OT Diagnosis: Generalized weakness         OT Goals(Current goals can be found in the care plan section) Acute Rehab OT Goals Patient Stated Goal: home today  OT Frequency:                End of Session Equipment Utilized During Treatment: Cervical collar Nurse Communication:  (nursing already knew how his ambulation was)  Activity Tolerance: Patient tolerated treatment well Patient left: in chair;with call bell/phone within reach   Time: 6222-9798 OT Time Calculation (min): 32 min Charges:  OT General Charges $OT Visit: 1 Procedure OT  Evaluation $Initial OT Evaluation Tier I: 1 Procedure OT Treatments $Self Care/Home Management : 8-22 mins G-Codes: OT G-codes **NOT FOR INPATIENT CLASS** Functional Assessment Tool Used: Clinical observation Functional Limitation: Self care Self Care Current Status (X2119): At least 1 percent but less than 20 percent impaired, limited or restricted Self Care Goal Status (E1740): At least 1 percent but less than 20 percent impaired, limited or restricted Self Care Discharge Status [id number]): At least 1 percent but less than 20 percent impaired, limited or restricted  Matthew, Norton [phone] 03/25/2015, 10:55 AM

## 2015-03-26 DIAGNOSIS — M5002 Cervical disc disorder with myelopathy, mid-cervical region: Secondary | ICD-10-CM | POA: Diagnosis not present

## 2015-03-26 MED ORDER — OXYCODONE-ACETAMINOPHEN 5-325 MG PO TABS: 1.0000 | ORAL_TABLET | ORAL | Status: DC | PRN

## 2015-03-26 NOTE — Discharge Summary (Signed)
Physician Discharge Summary  Patient ID: Matthew Norton MRN: [id number] DOB/AGE: 68/09/1946 68 y.o.  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 He is done well following surgery. He is up and ambulate actively. His wound is clean and dry. He's had mild dysphagia, which is improved. He had urinary retention initially, but his PVRs have steadily decreased and are now 170 mL. He was seen by OT, who felt that he did not need rehabilitative services. He is being discharged to home with instructions regarding wound care and activities. He is to return for follow-up with me in 3 weeks.  Discharge Exam: Blood pressure 147/80, pulse 50, temperature 98.6 F (37 C), temperature source Oral, resp. rate 18, height 5\' 9"  (1.753 m), weight 99.791 kg (220 lb), SpO2 99 %.  Disposition: Home     Medication List    TAKE these medications        aspirin 81 MG tablet  Take 81 mg by mouth daily.     diazepam 5 MG tablet  Commonly known as:  VALIUM  TAKE ONE TABLET BY MOUTH EVERY 12 HOURS AS NEEDED FOR ANXIETY     furosemide 20 MG tablet  Commonly known as:  LASIX  Take 20 mg by mouth daily as needed for fluid.     HYDROcodone-acetaminophen 5-325 MG per tablet  Commonly known as:  NORCO/VICODIN  Take 1-2 tablets by mouth every 6 (six) hours as needed for moderate pain.     losartan 25 MG tablet  Commonly known as:  COZAAR  Take 25 mg by mouth daily.     metoprolol tartrate 25 MG tablet  Commonly known as:  LOPRESSOR  Take 1 tablet (25 mg total) by mouth every morning.     nitroGLYCERIN 0.4 MG SL tablet  Commonly known as:  NITROSTAT  Place under the tongue every 5 (five) minutes as needed for chest pain.     oxyCODONE-acetaminophen 5-325 MG per tablet  Commonly known as:  PERCOCET/ROXICET  Take 1-2 tablets by mouth every 4 (four) hours as needed for moderate pain.     sertraline 100 MG tablet  Commonly known as:  ZOLOFT  Take 2 tablets (200 mg total) by mouth daily.         Signed: Hosie Spangle, MD 03/26/2015, 8:59 AM

## 2015-03-26 NOTE — Discharge Instructions (Signed)

## 2015-03-26 NOTE — Progress Notes (Signed)
Pt and family given D/C instructions with Rx's, verbal understanding was provided. Pt's incision is clean and dry with no sign of infection. Pt's IV was removed prior to D/C. Pt D/C'd home via wheelchair @ 1150 per MD order. Pt is stable @ D/C and has no other needs at this time. Holli Humbles, RN

## 2015-03-26 NOTE — Clinical Social Work Note (Signed)
Clinical Social Worker received referral for possible ST-SNF placement.  Chart reviewed.  PT/OT recommending no follow up at this time. Spoke with RN who states patient is agreeable with return home at discharge.    CSW signing off - please re consult if social work needs arise.  Barbette Or, [place]

## 2015-05-19 ENCOUNTER — Other Ambulatory Visit: Payer: Self-pay | Admitting: Family Medicine

## 2015-05-30 ENCOUNTER — Other Ambulatory Visit: Payer: Self-pay | Admitting: Family Medicine

## 2015-05-30 NOTE — Telephone Encounter (Signed)
Electronic refill request. Last Filled:    60 tablet 0 RF on 01/12/2015  Patient has appt 06/10/15.  Please advise.

## 2015-06-01 NOTE — Telephone Encounter (Signed)
Please call in.  Thanks.   

## 2015-06-02 ENCOUNTER — Other Ambulatory Visit: Payer: Self-pay | Admitting: Family Medicine

## 2015-06-02 NOTE — Telephone Encounter (Signed)
Medication phoned to pharmacy.  

## 2015-06-10 ENCOUNTER — Ambulatory Visit (INDEPENDENT_AMBULATORY_CARE_PROVIDER_SITE_OTHER): Payer: Medicare HMO | Admitting: Family Medicine

## 2015-06-10 ENCOUNTER — Encounter: Payer: Self-pay | Admitting: Family Medicine

## 2015-06-10 VITALS — BP 110/68 | HR 71 | Temp 98.6°F | Wt 212.0 lb

## 2015-06-10 DIAGNOSIS — M4802 Spinal stenosis, cervical region: Secondary | ICD-10-CM | POA: Diagnosis not present

## 2015-06-10 DIAGNOSIS — I10 Essential (primary) hypertension: Secondary | ICD-10-CM | POA: Diagnosis not present

## 2015-06-10 DIAGNOSIS — F418 Other specified anxiety disorders: Secondary | ICD-10-CM

## 2015-06-10 DIAGNOSIS — Z1159 Encounter for screening for other viral diseases: Secondary | ICD-10-CM

## 2015-06-10 DIAGNOSIS — Z119 Encounter for screening for infectious and parasitic diseases, unspecified: Secondary | ICD-10-CM | POA: Diagnosis not present

## 2015-06-10 DIAGNOSIS — F419 Anxiety disorder, unspecified: Secondary | ICD-10-CM

## 2015-06-10 DIAGNOSIS — F329 Major depressive disorder, single episode, unspecified: Secondary | ICD-10-CM

## 2015-06-10 NOTE — Progress Notes (Signed)
Pre visit review using our clinic review tool, if applicable. No additional management support is needed unless otherwise documented below in the visit note.  He has f/u with surgery pending for next week.  He still has some neck pain, and some arm tingling.  His grip is okay B.  He is off opiates.    Hypertension:    Using medication without problems or lightheadedness: yes Chest pain with exertion:no Edema:no Short of breath:occ, likely from deconditioning and smoking.   He isn't ready to quit smoking.    Anxiety.  Still on baseline meds.  No ADE on meds.  Still with anxiety, "the meds help a little bit, but I still am not where I am supposed to be."  Unclear if this is exacerbated by his pain level.  He is staying at home more.  No SI/HI.  D/w pt about prev incident at pharmacy, where he got agitated.  Per patient he was kidding around about the prev incident and he didn't have any ill will.  Per patient, he apologized to the pharmacy staff.  Discussed with patient re: possible change to cymbalta for mood and for pain relief, but this is pending his f/u with the surgery clinic.    D/w pt about HCV screening.  He opts in for screening with next set of labs.    I called pharmacy.  I told them he did apologize for the event- they verified that he apologized.  Pharmacy verified that he had zostavax on 08/09/13 but not a PNA shot.   Meds, vitals, and allergies reviewed.   ROS: See HPI.  Otherwise, noncontributory.  GEN: nad, alert and oriented, speech and judgement intact HEENT: mucous membranes moist NECK: supple w/o LA, no midline posterior tenderness CV: rrr PULM: ctab, no inc wob ABD: soft, +bs EXT: no edema SKIN: no acute rash

## 2015-06-10 NOTE — Assessment & Plan Note (Signed)
Controlled, continue as is.  F/u labs pending.

## 2015-06-10 NOTE — Assessment & Plan Note (Signed)
May need either TCA vs cymbalta.  Will await f/u surgery note.  Okay for outpatient f/u.  No SI/HI.  >25 minutes spent in face to face time with patient, >50% spent in counselling or coordination of care.

## 2015-06-10 NOTE — Assessment & Plan Note (Signed)
Will await surgery input. He may need change to cymbalta for pain control.  TCA could also be considered.

## 2015-06-10 NOTE — Assessment & Plan Note (Signed)
Will get with next set of labs.

## 2015-06-10 NOTE — Patient Instructions (Signed)
Schedule a fasting labs appointment when you can.  I want to know what the surgery clinic thinks of your pain situation.  We may be able to change to over from sertraline to cymbalta to help with the anxiety and pain.  See what they think in the meantime.  I would get a flu shot each fall.   Take care. Glad to see you.

## 2015-06-24 ENCOUNTER — Other Ambulatory Visit: Payer: Self-pay | Admitting: Family Medicine

## 2015-06-24 ENCOUNTER — Other Ambulatory Visit (INDEPENDENT_AMBULATORY_CARE_PROVIDER_SITE_OTHER): Payer: Medicare HMO

## 2015-06-24 ENCOUNTER — Telehealth: Payer: Self-pay | Admitting: Family Medicine

## 2015-06-24 DIAGNOSIS — I10 Essential (primary) hypertension: Secondary | ICD-10-CM | POA: Diagnosis not present

## 2015-06-24 DIAGNOSIS — Z119 Encounter for screening for infectious and parasitic diseases, unspecified: Secondary | ICD-10-CM

## 2015-06-24 LAB — LIPID PANEL
Cholesterol: 247 mg/dL — ABNORMAL HIGH (ref 0–200)
HDL: 30.4 mg/dL — ABNORMAL LOW (ref >39.00)
LDL Cholesterol: 181 mg/dL — ABNORMAL HIGH (ref 0–99)
NONHDL: 216.18
Total CHOL/HDL Ratio: 8
Triglycerides: 178 mg/dL — ABNORMAL HIGH (ref 0.0–149.0)
VLDL: 35.6 mg/dL (ref 0.0–40.0)

## 2015-06-24 LAB — COMPREHENSIVE METABOLIC PANEL
ALT: 26 U/L (ref 0–53)
AST: 23 U/L (ref 0–37)
Albumin: 4.1 g/dL (ref 3.5–5.2)
Alkaline Phosphatase: 79 U/L (ref 39–117)
BILIRUBIN TOTAL: 0.4 mg/dL (ref 0.2–1.2)
BUN: 19 mg/dL (ref 6–23)
CHLORIDE: 102 meq/L (ref 96–112)
CO2: 28 mEq/L (ref 19–32)
Calcium: 9.8 mg/dL (ref 8.4–10.5)
Creatinine, Ser: 1.21 mg/dL (ref 0.40–1.50)
GFR: 76.74 mL/min (ref >60.00)
GLUCOSE: 84 mg/dL (ref 70–99)
Potassium: 4.6 mEq/L (ref 3.5–5.1)
Sodium: 138 mEq/L (ref 135–145)
Total Protein: 7.7 g/dL (ref 6.0–8.3)

## 2015-06-25 LAB — HEPATITIS C ANTIBODY: HCV Ab: NEGATIVE

## 2015-06-25 MED ORDER — LOSARTAN POTASSIUM 25 MG PO TABS: 25.0000 mg | ORAL_TABLET | Freq: Every day | ORAL | Status: DC

## 2015-06-25 NOTE — Telephone Encounter (Signed)
Received fax refill request for Losartan 25 mg tablet.  Rx have not filled by Dr Damita Dunnings.  Patient was last seen on 06/10/15. No future appointment.

## 2015-06-25 NOTE — Telephone Encounter (Signed)
Patient notified

## 2015-06-25 NOTE — Telephone Encounter (Signed)
Mr grimsley returned your call Best  Number [phone]

## 2015-06-25 NOTE — Telephone Encounter (Signed)
Sent. Thanks.   

## 2015-06-26 ENCOUNTER — Other Ambulatory Visit: Payer: Self-pay | Admitting: Family Medicine

## 2015-06-26 ENCOUNTER — Telehealth: Payer: Self-pay

## 2015-06-26 MED ORDER — ATORVASTATIN CALCIUM 10 MG PO TABS: 10.0000 mg | ORAL_TABLET | Freq: Every day | ORAL | Status: DC

## 2015-06-26 NOTE — Progress Notes (Signed)
lipitor sent.  Update me and stop med if unable to tolerate it.  Thanks.

## 2015-06-26 NOTE — Progress Notes (Signed)
Notified patient and advised patient to give [place] an update on how he is tolerating the Lipitor in about a week, and to stop the Lipitor if he is unable to tolerate per Dr. Damita Dunnings

## 2015-06-30 NOTE — Telephone Encounter (Signed)
Error

## 2015-07-09 ENCOUNTER — Other Ambulatory Visit: Payer: Self-pay | Admitting: Family Medicine

## 2015-07-09 NOTE — Telephone Encounter (Signed)
Received a refill request for Valium. Last refilled 06/01/15 for #60 with 0 refills. Last office visit 06/10/15. Okay to refill this medication?

## 2015-07-10 NOTE — Telephone Encounter (Signed)
Please call in.  Thanks.   

## 2015-07-10 NOTE — Telephone Encounter (Signed)
Rx called in as directed.   

## 2015-07-24 ENCOUNTER — Telehealth: Payer: Self-pay | Admitting: Family Medicine

## 2015-07-24 NOTE — Telephone Encounter (Signed)
Needs OV to discuss med change, recent sx.  Thanks.

## 2015-07-24 NOTE — Telephone Encounter (Signed)
Pt is requesting a medication change from zoloft to a better medicine.  He is having side effects, he is stating that zoloft is giving him a runny nose, itching.  cb [phone] Ok to leave detailed message if no answer  Thank you

## 2015-07-25 NOTE — Telephone Encounter (Signed)
Lm on pts vm and advised per Dr Damita Dunnings. Requested pt contact office to schedule OV

## 2015-08-06 ENCOUNTER — Other Ambulatory Visit: Payer: Self-pay | Admitting: Family Medicine

## 2015-08-06 NOTE — Telephone Encounter (Signed)
Received refill request electronically from pharmacy Last refill 07/10/15 #60 Last office visit 06/10/15 Is it okay to refill?

## 2015-08-07 NOTE — Telephone Encounter (Signed)
Please call in.  Thanks.   

## 2015-08-07 NOTE — Telephone Encounter (Signed)
Rx called in as directed.   

## 2015-09-08 ENCOUNTER — Other Ambulatory Visit: Payer: Self-pay | Admitting: *Deleted

## 2015-09-08 MED ORDER — SERTRALINE HCL 100 MG PO TABS: 200.0000 mg | ORAL_TABLET | Freq: Every day | ORAL | Status: DC

## 2015-10-12 ENCOUNTER — Other Ambulatory Visit: Payer: Self-pay | Admitting: Family Medicine

## 2015-10-14 NOTE — Telephone Encounter (Signed)
Electronic refill request. Last Filled:    60 tablet 1 08/07/2015  Last office visit:   06/10/2015  Please advise.

## 2015-10-15 NOTE — Telephone Encounter (Signed)
Rx called to pharmacy as instructed. 

## 2015-10-15 NOTE — Telephone Encounter (Signed)
Please call in.  Thanks.   

## 2015-11-28 DIAGNOSIS — H2513 Age-related nuclear cataract, bilateral: Secondary | ICD-10-CM | POA: Diagnosis not present

## 2015-12-11 ENCOUNTER — Other Ambulatory Visit: Payer: Self-pay | Admitting: Family Medicine

## 2015-12-11 NOTE — Telephone Encounter (Signed)
Electronic refill request. Last Filled:    60 tablet 1 10/15/2015  Last office visit:   06/10/15  Please advise.

## 2015-12-12 ENCOUNTER — Other Ambulatory Visit: Payer: Self-pay | Admitting: Family Medicine

## 2015-12-12 NOTE — Telephone Encounter (Signed)
Medication phoned to pharmacy.  

## 2015-12-12 NOTE — Telephone Encounter (Signed)
Please call in.  Thanks.   

## 2016-02-09 ENCOUNTER — Other Ambulatory Visit: Payer: Self-pay | Admitting: Family Medicine

## 2016-02-09 NOTE — Telephone Encounter (Signed)
Please call in.  Thanks.   

## 2016-02-09 NOTE — Telephone Encounter (Signed)
Electronic refill request. Last Filled:    60 tablet 1 12/12/2015  Last office visit:   06/10/15   Please advise.

## 2016-02-10 NOTE — Telephone Encounter (Signed)
Medication phoned to pharmacy.  

## 2016-03-01 ENCOUNTER — Ambulatory Visit (INDEPENDENT_AMBULATORY_CARE_PROVIDER_SITE_OTHER): Payer: PRIVATE HEALTH INSURANCE | Admitting: Family Medicine

## 2016-03-01 ENCOUNTER — Encounter: Payer: Self-pay | Admitting: Family Medicine

## 2016-03-01 VITALS — BP 128/74 | HR 69 | Temp 98.5°F | Wt 207.5 lb

## 2016-03-01 DIAGNOSIS — E785 Hyperlipidemia, unspecified: Secondary | ICD-10-CM

## 2016-03-01 DIAGNOSIS — M4802 Spinal stenosis, cervical region: Secondary | ICD-10-CM | POA: Diagnosis not present

## 2016-03-01 DIAGNOSIS — H8110 Benign paroxysmal vertigo, unspecified ear: Secondary | ICD-10-CM | POA: Diagnosis not present

## 2016-03-01 DIAGNOSIS — F418 Other specified anxiety disorders: Secondary | ICD-10-CM

## 2016-03-01 DIAGNOSIS — F419 Anxiety disorder, unspecified: Secondary | ICD-10-CM

## 2016-03-01 DIAGNOSIS — R399 Unspecified symptoms and signs involving the genitourinary system: Secondary | ICD-10-CM

## 2016-03-01 DIAGNOSIS — F329 Major depressive disorder, single episode, unspecified: Secondary | ICD-10-CM

## 2016-03-01 MED ORDER — ACETAMINOPHEN 500 MG PO TABS: 1000.0000 mg | ORAL_TABLET | Freq: Three times a day (TID) | ORAL | Status: AC | PRN

## 2016-03-01 MED ORDER — SERTRALINE HCL 100 MG PO TABS: 50.0000 mg | ORAL_TABLET | Freq: Every day | ORAL | Status: DC

## 2016-03-01 NOTE — Patient Instructions (Signed)
Cut the sertraline back to 1/2 tab at night and see if you sleep better.   Take 2 tylenol every 8 hours as needed for the shoulder and arm pains.  We'll try to get you back on the lipitor at the next visit.   We can talk about your urinary symptoms more then Recheck in about 2 weeks.  Take care.  Glad to see you.

## 2016-03-01 NOTE — Progress Notes (Signed)
Pre visit review using our clinic review tool, if applicable. No additional management support is needed unless otherwise documented below in the visit note. He understood his meds and could describe what/how he has been taking his meds.    Recently more sleep disruption, nightmares, he attributed to the higher dose of sertraline.  H/o depression, had tried mult meds.  He wasn't sure that zoloft was helpful at this point.  D/w pt.  No SI/HI.  Taking 100mg  a day of sertraline now.  Still on valium BID, w/o ADE and it helps his mood.    He has more allergy sx- itchy eyes, runny nose, esp in the AM.    He has continued to have some B shoulder pain with ROM but no arm drop, along with ongoing neck pain.    He had recently episode of vertigo when he sat up in the bed.  No presyncope, but had vertigo.  Lasted a few seconds.  The 1st episode was worse then the 2nd episode.  No other since the 2nd episode.    Off lipitor in the meantime- he didn't take it for a long period of time but he didn't recall ADE on med.  Has old rx for metoprolol but off med.    Meds, vitals, and allergies reviewed.   ROS: Per HPI unless specifically indicated in ROS section   GEN: nad, alert and oriented HEENT: mucous membranes moist NECK: supple w/o LA CV: rrr PULM: ctab, no inc wob ABD: soft, +bs EXT: no edema SKIN: no acute rash No vertigo sx with head turning or eye tracking at OV.

## 2016-03-02 DIAGNOSIS — R399 Unspecified symptoms and signs involving the genitourinary system: Secondary | ICD-10-CM | POA: Insufficient documentation

## 2016-03-02 DIAGNOSIS — H8110 Benign paroxysmal vertigo, unspecified ear: Secondary | ICD-10-CM | POA: Insufficient documentation

## 2016-03-02 NOTE — Assessment & Plan Note (Signed)
Likely, resolved now, TM wnl on exam.  No tx at this point, d/w pt re: path/phys.

## 2016-03-02 NOTE — Assessment & Plan Note (Signed)
We'll try to get him back on statin at the next OV, since he has other med changes at this OV.  His aches aren't statin related at this point, since he has been off med.

## 2016-03-02 NOTE — Assessment & Plan Note (Signed)
Dec sertraline to 50mg  a day and continue prn BZD in meantime.  Okay for outpatient f/u.  No SI/HI. >25 minutes spent in face to face time with patient, >50% spent in counselling or coordination of care

## 2016-03-02 NOTE — Assessment & Plan Note (Signed)
He's had some chronic slowing of his stream.  We can address as future OV. Not emergent/urgent.

## 2016-03-02 NOTE — Assessment & Plan Note (Signed)
Take 2 tylenol every 8 hours as needed for the shoulder and arm pain, d/w pt.

## 2016-03-15 ENCOUNTER — Encounter: Payer: Self-pay | Admitting: Family Medicine

## 2016-03-15 ENCOUNTER — Ambulatory Visit (INDEPENDENT_AMBULATORY_CARE_PROVIDER_SITE_OTHER): Payer: Medicare Other | Admitting: Family Medicine

## 2016-03-15 VITALS — BP 128/72 | HR 76 | Temp 98.6°F | Wt 208.8 lb

## 2016-03-15 DIAGNOSIS — F418 Other specified anxiety disorders: Secondary | ICD-10-CM | POA: Diagnosis not present

## 2016-03-15 DIAGNOSIS — E785 Hyperlipidemia, unspecified: Secondary | ICD-10-CM | POA: Diagnosis not present

## 2016-03-15 DIAGNOSIS — M4802 Spinal stenosis, cervical region: Secondary | ICD-10-CM | POA: Diagnosis not present

## 2016-03-15 DIAGNOSIS — J309 Allergic rhinitis, unspecified: Secondary | ICD-10-CM | POA: Diagnosis not present

## 2016-03-15 DIAGNOSIS — F329 Major depressive disorder, single episode, unspecified: Secondary | ICD-10-CM

## 2016-03-15 DIAGNOSIS — F32A Depression, unspecified: Secondary | ICD-10-CM

## 2016-03-15 DIAGNOSIS — F419 Anxiety disorder, unspecified: Principal | ICD-10-CM

## 2016-03-15 MED ORDER — LORATADINE 10 MG PO TABS: 10.0000 mg | ORAL_TABLET | Freq: Every day | ORAL | Status: DC

## 2016-03-15 MED ORDER — GABAPENTIN 100 MG PO CAPS: 100.0000 mg | ORAL_CAPSULE | Freq: Three times a day (TID) | ORAL | Status: DC | PRN

## 2016-03-15 NOTE — Assessment & Plan Note (Addendum)
Add on gabapentin with routine cautions in the meantime.  He agrees.  This may help some for the R hand sx or the L thumb pain.

## 2016-03-15 NOTE — Assessment & Plan Note (Signed)
We can work on restarting statin later on.  D/w pt . He agrees. >25 minutes spent in face to face time with patient, >50% spent in counselling or coordination of care

## 2016-03-15 NOTE — Progress Notes (Signed)
Pre visit review using our clinic review tool, if applicable. No additional management support is needed unless otherwise documented below in the visit note.  Still with R arm and neck pain.  Tingling moving down the R arm with numbness in the R thumb. Still with L thumb pain, going on for about 10 years, s/p operations, still with sig pain/sensitivity on the fingertip.   S/p C spine surgery last year.    Prev MRI with: Moderate spinal stenosis at C3-4 has progressed since 2007   Severe spinal stenosis at C4-5 has progressed. There is a central  disc protrusion as well as extensive uncinate spurring contributing  to spinal stenosis and severe foraminal encroachment bilaterally.  Bilateral cord hyperintensity at C4-5 is unchanged from the prior  MRI   Mild to moderate spinal stenosis C5-6 has progressed.  -----------------------------  Sleeping some better since the last OV.  The plan was to dec sertraline to 50mg  a day and continue prn BZD in meantime.  He still wakes some at night but is able to get back to sleep.  That is some better than prev.    Waking up with rhinorrhea, HA, sneezing.    Poor dentition. Asking about options.  I told him I would check on [facility] dental [facility] options.    LUTS are some better in the meantime on lower dose of SSRI.  Still off statin.    Meds, vitals, and allergies reviewed.   ROS: Per HPI unless specifically indicated in ROS section   GEN: nad, alert and oriented HEENT: mucous membranes moist, poor dentition.  NECK: supple w/o LA CV: rrr.  PULM: ctab, no inc wob ABD: soft, +bs EXT: no edema  SKIN: no acute rash L thumb tip tender to very light touch w/o skin changes noted on inspection. No erythema.  No weakness in the BUE o/w.

## 2016-03-15 NOTE — Assessment & Plan Note (Signed)
Sleeping some better since the last OV. The plan was to dec sertraline to 50mg  a day and continue prn BZD in meantime. He still wakes some at night but is able to get back to sleep. That is some better than prev.  Continue as is for now.

## 2016-03-15 NOTE — Patient Instructions (Signed)
For now, stay off the lipitor.  Add on claritin 10mg  a day and see if that helps with the runny nose.   Available over the counter.  Add on gabapentin 100mg  a day for a few days.  Gradually increase up to 100mg  3 times a day, then up to 200mg  3 times a day.  It it makes you drowsy or if you have other problems then cut the dose back and let me know.  Update me in about 2 weeks.  We can work on the cholesterol medicine (possibly restarting it) later on. Take care.  Glad to see you.

## 2016-03-15 NOTE — Assessment & Plan Note (Signed)
Presumed, try claritin. F/u prn.  He agrees.

## 2016-03-25 ENCOUNTER — Telehealth: Payer: Self-pay

## 2016-03-25 MED ORDER — PREGABALIN 50 MG PO CAPS: 50.0000 mg | ORAL_CAPSULE | Freq: Two times a day (BID) | ORAL | Status: DC

## 2016-03-25 NOTE — Telephone Encounter (Signed)
Medication phoned to pharmacy.  

## 2016-03-25 NOTE — Telephone Encounter (Signed)
Stop gabapentin.  Try lyrica.   Would start with 1 a day for 1 week, then increase to 1 po BID thereafter if tolerated.  Update me as needed.  Please call in.  Thanks.

## 2016-03-25 NOTE — Telephone Encounter (Signed)
Pt left v/m; pt was seen 03/15/16; pt has stopped taking gabapentin; gabapentin caused h/a, can't focus,pt looked at clock yesterday and could not see hands on clock, and makes pt feel weak; did not help hand.[facility] [address]. Pt request cb.

## 2016-03-29 NOTE — Telephone Encounter (Addendum)
Pt left v/m; pt read about side effects for new med  Lyrica; pt is going to have all his teeth pulled out; pt is concerned about starting Lyrica and request cb.[email or web address] [address].

## 2016-03-29 NOTE — Telephone Encounter (Signed)
Will you please call patient and find out what he's unsure about in regards to the Lyrica?

## 2016-03-29 NOTE — Telephone Encounter (Signed)
In my opinion this is unlikely, however, if he is skeptical he may begin Lyrica after his procedure.

## 2016-03-29 NOTE — Telephone Encounter (Signed)
Pt left v/m; pt concerned about lyrica thinning blood, also not sure if abx dentist will give pt will effect the lyrica. Pt wants to know if should start Lyrica after gets dental work done.pt request cb.

## 2016-03-30 NOTE — Telephone Encounter (Signed)
Patient notified and verbalized understanding. 

## 2016-04-09 ENCOUNTER — Other Ambulatory Visit: Payer: Self-pay | Admitting: Family Medicine

## 2016-04-09 NOTE — Telephone Encounter (Signed)
Electronic refill request. Last Filled:    60 tablet 1 02/09/2016  Please advise.

## 2016-04-11 NOTE — Telephone Encounter (Signed)
Please call in.  Thanks.   

## 2016-04-12 NOTE — Telephone Encounter (Signed)
Medication phoned to pharmacy.  

## 2016-04-25 ENCOUNTER — Emergency Department
Admission: EM | Admit: 2016-04-25 | Discharge: 2016-04-25 | Disposition: A | Discharge status: Home / Self Care | Payer: Medicare Other | Attending: Emergency Medicine | Admitting: Emergency Medicine

## 2016-04-25 ENCOUNTER — Emergency Department: Payer: Medicare Other

## 2016-04-25 DIAGNOSIS — R079 Chest pain, unspecified: Secondary | ICD-10-CM | POA: Diagnosis not present

## 2016-04-25 DIAGNOSIS — M546 Pain in thoracic spine: Secondary | ICD-10-CM

## 2016-04-25 DIAGNOSIS — I10 Essential (primary) hypertension: Secondary | ICD-10-CM | POA: Diagnosis not present

## 2016-04-25 DIAGNOSIS — I2510 Atherosclerotic heart disease of native coronary artery without angina pectoris: Secondary | ICD-10-CM | POA: Insufficient documentation

## 2016-04-25 DIAGNOSIS — Z7982 Long term (current) use of aspirin: Secondary | ICD-10-CM | POA: Insufficient documentation

## 2016-04-25 DIAGNOSIS — M1990 Unspecified osteoarthritis, unspecified site: Secondary | ICD-10-CM | POA: Diagnosis not present

## 2016-04-25 DIAGNOSIS — F329 Major depressive disorder, single episode, unspecified: Secondary | ICD-10-CM | POA: Insufficient documentation

## 2016-04-25 DIAGNOSIS — F17200 Nicotine dependence, unspecified, uncomplicated: Secondary | ICD-10-CM | POA: Insufficient documentation

## 2016-04-25 DIAGNOSIS — E785 Hyperlipidemia, unspecified: Secondary | ICD-10-CM | POA: Insufficient documentation

## 2016-04-25 DIAGNOSIS — R918 Other nonspecific abnormal finding of lung field: Secondary | ICD-10-CM | POA: Diagnosis not present

## 2016-04-25 LAB — URINALYSIS COMPLETE WITH MICROSCOPIC (ARMC ONLY)
BILIRUBIN URINE: NEGATIVE
Bacteria, UA: NONE SEEN
GLUCOSE, UA: NEGATIVE mg/dL
KETONES UR: NEGATIVE mg/dL
LEUKOCYTES UA: NEGATIVE
NITRITE: NEGATIVE
PH: 6 (ref 5.0–8.0)
Protein, ur: NEGATIVE mg/dL
SPECIFIC GRAVITY, URINE: 1.009 (ref 1.005–1.030)
SQUAMOUS EPITHELIAL / LPF: NONE SEEN

## 2016-04-25 LAB — CBC
HCT: 44.8 % (ref 40.0–52.0)
Hemoglobin: 15.7 g/dL (ref 13.0–18.0)
MCH: 31.6 pg (ref 26.0–34.0)
MCHC: 35.1 g/dL (ref 32.0–36.0)
MCV: 89.8 fL (ref 80.0–100.0)
PLATELETS: 195 10*3/uL (ref 150–440)
RBC: 4.99 MIL/uL (ref 4.40–5.90)
RDW: 13.7 % (ref 11.5–14.5)
WBC: 5.4 10*3/uL (ref 3.8–10.6)

## 2016-04-25 LAB — BASIC METABOLIC PANEL
Anion gap: 8 (ref 5–15)
BUN: 10 mg/dL (ref 6–20)
CHLORIDE: 106 mmol/L (ref 101–111)
CO2: 22 mmol/L (ref 22–32)
CREATININE: 1.07 mg/dL (ref 0.61–1.24)
Calcium: 9.6 mg/dL (ref 8.9–10.3)
Glucose, Bld: 89 mg/dL (ref 65–99)
Potassium: 4 mmol/L (ref 3.5–5.1)
SODIUM: 136 mmol/L (ref 135–145)

## 2016-04-25 LAB — TROPONIN I
Troponin I: <0.03 ng/mL (ref <0.03)
Troponin I: <0.03 ng/mL (ref <0.03)

## 2016-04-25 MED ORDER — FENTANYL CITRATE (PF) 100 MCG/2ML IJ SOLN
50.0000 ug | Freq: Once | INTRAMUSCULAR | Status: AC
  Administered 2016-04-25: 50 ug via INTRAVENOUS
  Filled 2016-04-25: qty 2

## 2016-04-25 MED ORDER — ACETAMINOPHEN 500 MG PO TABS
1000.0000 mg | ORAL_TABLET | Freq: Once | ORAL | Status: AC
  Administered 2016-04-25: 1000 mg via ORAL
  Filled 2016-04-25: qty 2

## 2016-04-25 MED ORDER — OXYCODONE HCL 5 MG PO TABS: 5.0000 mg | ORAL_TABLET | Freq: Four times a day (QID) | ORAL | Status: DC | PRN

## 2016-04-25 NOTE — ED Provider Notes (Signed)
[facility] Emergency Department Provider Note  ____________________________________________  Time seen: Approximately 12:36 PM  I have reviewed the triage vital signs and the nursing notes.   HISTORY  Chief Complaint Weakness and Shoulder Pain   HPI Matthew Norton is a 70 y.o. male h/o CAD s/p stents and MI, hypertension, hyperlipidemia, cervical spinal stenosis who presents for CP. Patient is a very poor historian. Patient reports a week ago he picked up a very heavy table. 2 days later he picked up a heavy toolbox. He started having midline back pain after picking up the table that got progressively worse after picking up a heavy toolbox. He has had 4 days of constant pain. He describes the pain as sharp, starting in the midline of his thoracic spine radiating to his scapula and left chest and down his left arm. He endorses numbness and paresthesias of the left arm however says that that is old. He denies weakness of his extremities, saddle anesthesia, urinary or bowel incontinence or retention. Patient does endorse 2 surgeries on his cervical spine. He reports the pain is nonpleuritic. At this time pain is 8/10. He denies diaphoresis, nausea, vomiting, shortness of breath, abdominal pain. He reports that this pain is different than his prior MIs. He denies trauma to his back.  Past Medical History  Diagnosis Date  . Coronary artery disease   . Arthritis   . Hyperlipemia     has tried meds but not on any bc of joint aches and pains  . Anxiety     takes Valium daily as needed  . Hypertension     takes Metoprolol and Losartan daily  . Complication of anesthesia     slow to wake up after one surgery  . Peripheral edema   . Shortness of breath     with exertion   . Weakness     numbness in both hands and feet.  . Joint pain   . Cataracts, bilateral   . Insomnia   . Depression     Patient Active Problem List   Diagnosis Date Noted  . Allergic rhinitis  03/15/2016  . Benign paroxysmal positional vertigo 03/02/2016  . Lower urinary tract symptoms (LUTS) 03/02/2016  . HNP (herniated nucleus pulposus) with myelopathy, cervical 03/24/2015  . CAD in native artery 10/27/2014  . HTN (hypertension) 10/27/2014  . HLD (hyperlipidemia) 10/27/2014  . Spinal stenosis in cervical region 10/27/2014  . Chronic venous insufficiency 10/27/2014  . Anxiety and depression 10/27/2014    Past Surgical History  Procedure Laterality Date  . Cardiac catheterization  08,11    stents x2  . Clavicle surgery  1965    fx rt   . Back surgery  2001    lumb lam  . Hernia repair  2007    rt and lt ing   . Replantation thumb  2003    left  . Nailbed repair Left 09/13/2013    Procedure: LEFT THUMB NAIL REMOVAL, NAIL BED RECONSTRUCTION PARTIAL EXCISION DISTAL PHALANX WITH COMPLEX RECONSTRUCTION ;  Surgeon: Roseanne Kaufman, MD;  Location: [facility];  Service: Orthopedics;  Laterality: Left;  . Coronary angioplasty      2 stents  . Esophagogastroduodenoscopy    . Parotid mass removed    . Anterior cervical decomp/discectomy fusion N/A 03/24/2015    Procedure: Cervical four- five, anterior cervical decompression with fusion, plating and bonegraft;  Surgeon: Jovita Gamma, MD;  Location: [place];  Service: Neurosurgery;  Laterality:  N/A;  C45 anterior cervical decompression with fusion plating and bonegraft    Current Outpatient Rx  Name  Route  Sig  Dispense  Refill  . acetaminophen (TYLENOL) 500 MG tablet   Oral   Take 2 tablets (1,000 mg total) by mouth every 8 (eight) hours as needed (for pain).         Marland Kitchen aspirin 81 MG tablet   Oral   Take 81 mg by mouth daily.         Marland Kitchen atorvastatin (LIPITOR) 10 MG tablet   Oral   Take 1 tablet (10 mg total) by mouth daily. Patient not taking: Reported on 03/01/2016   90 tablet   3   . diazepam (VALIUM) 5 MG tablet      TAKE ONE TABLET BY MOUTH EVERY 12 HOURS AS NEEDED   60 tablet   1   .  loratadine (CLARITIN) 10 MG tablet   Oral   Take 1 tablet (10 mg total) by mouth daily.         Marland Kitchen losartan (COZAAR) 25 MG tablet   Oral   Take 1 tablet (25 mg total) by mouth daily.   90 tablet   3   . nitroGLYCERIN (NITROSTAT) 0.4 MG SL tablet   Sublingual   Place under the tongue every 5 (five) minutes as needed for chest pain.          Marland Kitchen oxyCODONE (ROXICODONE) 5 MG immediate release tablet   Oral   Take 1 tablet (5 mg total) by mouth every 6 (six) hours as needed.   5 tablet   0   . pregabalin (LYRICA) 50 MG capsule   Oral   Take 1 capsule (50 mg total) by mouth 2 (two) times daily.   60 capsule   1   . sertraline (ZOLOFT) 100 MG tablet   Oral   Take 0.5 tablets (50 mg total) by mouth daily.   60 tablet   5     Allergies Bupropion; Flexeril; Gabapentin; Hydrochlorothiazide; Paroxetine hcl; Pravastatin; and Venlafaxine  Family History  Problem Relation Age of Onset  . Hypertension Mother   . Cancer Mother   . Colon cancer Neg Hx   . Prostate cancer Neg Hx     Social History Social History  Substance Use Topics  . Smoking status: Current Every Day Smoker -- 1.00 packs/day for 50 years  . Smokeless tobacco: Not on file  . Alcohol Use: No    Review of Systems  Constitutional: Negative for fever. Eyes: Negative for visual changes. ENT: Negative for sore throat. Cardiovascular: + chest pain. Respiratory: Negative for shortness of breath. Gastrointestinal: Negative for abdominal pain, vomiting or diarrhea. Genitourinary: Negative for dysuria. Musculoskeletal: + back pain. Skin: Negative for rash. Neurological: Negative for headaches, weakness or numbness.  ____________________________________________   PHYSICAL EXAM:  VITAL SIGNS: ED Triage Vitals  Enc Vitals Group     BP 04/25/16 1042 131/81 mmHg     Pulse Rate 04/25/16 1042 58     Resp 04/25/16 1042 20     Temp 04/25/16 1042 98.8 F (37.1 C)     Temp Source 04/25/16 1042 Oral     SpO2  04/25/16 1042 99 %     Weight 04/25/16 1042 200 lb (90.719 kg)     Height 04/25/16 1042 5\' 9"  (1.753 m)     Head Cir --      Peak Flow --      Pain Score 04/25/16 1047 8  Pain Loc --      Pain Edu? --      Excl. in [place]? --     Constitutional: Alert and oriented. Well appearing and in no apparent distress. HEENT:      Head: Normocephalic and atraumatic.         Eyes: Conjunctivae are normal. Sclera is non-icteric. EOMI. PERRL      Mouth/Throat: Mucous membranes are moist.       Neck: Supple with no signs of meningismus. Cardiovascular: Regular rate and rhythm. No murmurs, gallops, or rubs. 2+ symmetrical distal pulses are present in all extremities. No JVD. Respiratory: Normal respiratory effort. Lungs are clear to auscultation bilaterally. No wheezes, crackles, or rhonchi.  Gastrointestinal: Soft, non tender, and non distended with positive bowel sounds. No rebound or guarding. Genitourinary: No CVA tenderness. Musculoskeletal: Midline mid thoracic spinal tenderness , no redness or step offs. Full painless ROM of L shoulder Neurologic: A & O x3, PERRL, no nystagmus, CN II-XII intact, motor testing reveals good tone and bulk throughout. There is no evidence of pronator drift or dysmetria. Muscle strength is 5/5 throughout. Deep tendon reflexes are 2+ throughout with downgoing toes. Sensory examination is intact. Gait is normal. Normal speech and language.  Skin: Skin is warm, dry and intact. No rash noted. Psychiatric: Mood and affect are normal. Speech and behavior are normal.  ____________________________________________   LABS (all labs ordered are listed, but only abnormal results are displayed)  Labs Reviewed  URINALYSIS COMPLETEWITH MICROSCOPIC ([place]) - Abnormal; Notable for the following:    Color, Urine STRAW (*)    APPearance CLEAR (*)    Hgb urine dipstick 1+ (*)    All other components within normal limits  BASIC METABOLIC PANEL  CBC  TROPONIN I  TROPONIN I    CBG MONITORING, ED   ____________________________________________  EKG  ED ECG REPORT I, Rudene Re, the attending physician, personally viewed and interpreted this ECG.  Sinus rhythm, rate of 61, right bundle branch block, normal QTC intervals, normal axis, no ST elevations or depressions. Unchanged from prior.  ____________________________________________  RADIOLOGY  CT thoracic spine: Negative CT chest: Negative ____________________________________________   PROCEDURES  Procedure(s) performed: None Critical Care performed:  None ____________________________________________   INITIAL IMPRESSION / ASSESSMENT AND PLAN / ED COURSE  70 y.o. male h/o CAD s/p stents and MI, hypertension, hyperlipidemia, cervical spinal stenosis who presents for thoracic midline pain radiating to his chest wall and L arm for 4 days since lifting heavy objects. No neuro deficits on exam, equal pulses and BP on b/l UE. Patient is tender to palpation on midline mid thoracic spine and reports a sharp pain down his left arm when I press on it. No obvious step-offs or trauma, no redness or warmth. Remainder of his physical exam is intact with equal pulses, equal reflexes in all 4 extremities, normal strength and sensation. EKG with no evidence of ischemia. First troponin is negative. We'll get a CT of the thoracic spine to rule out fracture or acute pathology, get a second troponin, treated his pain with IV fentanyl, and reassess  _________________________ 4:06 PM on 04/25/2016 -----------------------------------------  CT thoracic spine negative. Patient remains neuro intact. Pain improved. No fever or leukocytosis to make me concern for disciitis. No fracture or bulging disk on CT. No neuro deficits to justofy an MRI. Will dc with pain control, close f/u with PCP. Discuss return precautions with patient and family in the room.  Pertinent labs & imaging results that  were available during my care of  the patient were reviewed by me and considered in my medical decision making (see chart for details).   I discussed my evaluation of the patient's symptoms, my clinical impression, and my proposed outpatient treatment plan with patient/ family members. We have discussed anticipatory guidance, scheduled follow-up, and careful return precautions. The patient expresses understanding and is comfortable with the discharge plan. All patient's questions were answered.    ____________________________________________   FINAL CLINICAL IMPRESSION(S) / ED DIAGNOSES  Final diagnoses:  Midline thoracic back pain      NEW MEDICATIONS STARTED DURING THIS VISIT:  New Prescriptions   OXYCODONE (ROXICODONE) 5 MG IMMEDIATE RELEASE TABLET    Take 1 tablet (5 mg total) by mouth every 6 (six) hours as needed.     Note:  This document was prepared using Dragon voice recognition software and may include unintentional dictation errors.    Rudene Re, MD 04/25/16 [phone]

## 2016-04-25 NOTE — ED Notes (Signed)
Pt transported to xray 

## 2016-04-25 NOTE — ED Notes (Addendum)
Pt reports neck, back and shoulder pain that started 3 days ago radiates down left arm. Denies fall or injury Pt also c/o 3 bug bites 2 months ago that continue to itch. States he also feels weak and daily headaches for past week.

## 2016-04-25 NOTE — Discharge Instructions (Signed)
You have been seen in the Emergency Department (ED)  today for back pain.  Back pain has many possible causes some are related to muscles while others have more serious causes. Even though you were checked carefully today and your exam and evaluation were reassuring, problems may develop later or continue to unfold. Therefore it is imperative that you follow up with doctor closely for further evaluation.  Follow-up with your doctor in 1 day for further evaluation.  For pain control take: tylenol 1000mg  every 8 hours and oxycodone 5mg  every 6 hours as needed for pain  When should you call for help?  Call your doctor now or seek immediate medical care if:  You have new or worsening numbness in your legs.  You have new or worsening weakness in your legs. (This could make it hard to stand up.)  You lose control of your bladder or bowels or if you are unable to urinate. You have numbness of your groin or buttock region If you develop a fever  Watch closely for changes in your health, and be sure to contact your doctor if:  Your pain gets worse.  You are not getting better after 2 weeks.  How can you care for yourself at home?  Take pain medicines exactly as directed.  If the doctor gave you a prescription medicine for pain, take it as prescribed.  If you are not taking a prescription pain medicine, ask your doctor if you can take an over-the-counter medicine like tylenol or ibuprofen. Sit or lie in positions that are most comfortable and reduce your pain. Try one of these positions when you lie down:  Lie on your back with your knees bent and supported by large pillows.  Lie on the floor with your legs on the seat of a sofa or chair.  Lie on your side with your knees and hips bent and a pillow between your legs.  Lie on your stomach if it does not make pain worse. Do not sit up in bed, and avoid soft couches and twisted positions. Bed rest can help relieve pain at first, but it delays healing.  Avoid bed rest after the first day of back pain.  Change positions every 30 minutes. If you must sit for long periods of time, take breaks from sitting. Get up and walk around, or lie in a comfortable position.  Try using a heating pad on a low or medium setting for 15 to 20 minutes every 2 or 3 hours. Try a warm shower in place of one session with the heating pad.  You can also try an ice pack for 10 to 15 minutes every 2 to 3 hours. Put a thin cloth between the ice pack and your skin.  Take short walks several times a day. You can start with 5 to 10 minutes, 3 or 4 times a day, and work up to longer walks. Walk on level surfaces and avoid hills and stairs until your back is better.  Return to work and other activities as soon as you can. Continued rest without activity is usually not good for your back.  To prevent future back pain, do exercises to stretch and strengthen your back and stomach. Learn how to use good posture, safe lifting techniques, and proper body mechanics.

## 2016-04-27 ENCOUNTER — Ambulatory Visit: Payer: PRIVATE HEALTH INSURANCE | Admitting: Family Medicine

## 2016-04-29 ENCOUNTER — Ambulatory Visit (INDEPENDENT_AMBULATORY_CARE_PROVIDER_SITE_OTHER): Payer: Medicare Other | Admitting: Family Medicine

## 2016-04-29 ENCOUNTER — Ambulatory Visit (INDEPENDENT_AMBULATORY_CARE_PROVIDER_SITE_OTHER): Payer: Medicare Other

## 2016-04-29 ENCOUNTER — Encounter: Payer: Self-pay | Admitting: Family Medicine

## 2016-04-29 VITALS — BP 122/80 | HR 80 | Temp 98.8°F | Ht 67.0 in | Wt 202.5 lb

## 2016-04-29 DIAGNOSIS — F329 Major depressive disorder, single episode, unspecified: Secondary | ICD-10-CM

## 2016-04-29 DIAGNOSIS — Z1211 Encounter for screening for malignant neoplasm of colon: Secondary | ICD-10-CM

## 2016-04-29 DIAGNOSIS — Z23 Encounter for immunization: Secondary | ICD-10-CM | POA: Diagnosis not present

## 2016-04-29 DIAGNOSIS — Z800 Family history of malignant neoplasm of digestive organs: Secondary | ICD-10-CM | POA: Diagnosis not present

## 2016-04-29 DIAGNOSIS — Z8042 Family history of malignant neoplasm of prostate: Secondary | ICD-10-CM | POA: Diagnosis not present

## 2016-04-29 DIAGNOSIS — Z0000 Encounter for general adult medical examination without abnormal findings: Secondary | ICD-10-CM | POA: Diagnosis not present

## 2016-04-29 DIAGNOSIS — M4802 Spinal stenosis, cervical region: Secondary | ICD-10-CM

## 2016-04-29 DIAGNOSIS — F419 Anxiety disorder, unspecified: Secondary | ICD-10-CM

## 2016-04-29 DIAGNOSIS — F418 Other specified anxiety disorders: Secondary | ICD-10-CM

## 2016-04-29 DIAGNOSIS — Z7189 Other specified counseling: Secondary | ICD-10-CM

## 2016-04-29 DIAGNOSIS — F32A Depression, unspecified: Secondary | ICD-10-CM

## 2016-04-29 MED ORDER — PREGABALIN 50 MG PO CAPS: 50.0000 mg | ORAL_CAPSULE | Freq: Two times a day (BID) | ORAL | Status: DC

## 2016-04-29 MED ORDER — SERTRALINE HCL 100 MG PO TABS: 100.0000 mg | ORAL_TABLET | Freq: Every day | ORAL | Status: DC

## 2016-04-29 NOTE — Patient Instructions (Addendum)
Try lyrica in the meantime and if the pain isn't better then update me.   Go back to the higher dose of zoloft.   Go to the lab on the way out.  We'll contact you with your lab report (stool cards).   See if anyone in your family had prostate cancer.  If so, then let me know.  Take care.  Glad to see you.

## 2016-04-29 NOTE — Progress Notes (Signed)
Pre visit review using our clinic review tool, if applicable. No additional management support is needed unless otherwise documented below in the visit note.  I reviewed health advisor's note, was available for consultation on the day of service listed in this note, and agree with documentation and plan. Graham Duncan, MD.  

## 2016-04-29 NOTE — Patient Instructions (Signed)

## 2016-04-29 NOTE — Progress Notes (Addendum)
Aches on lipitor, better off med. D/w pt.  Statin intolerant.    D/w pt.  If patient were incapacitated, then would have [employer].   Mood worse on lower dose of zoloft. More irritable, "crabby".  Others have noted the change in addition to the patient.  No SI/HI.  D/w pt about med dose adjustment.    Didn't tolerate gabapentin for pain in the hands.  His hands are worse in the meantime, after trying to lift a heavy tool box.  Has been treating the pain by taking tylenol, with some relief.  D/w pt about possibly seeing neuro.  He hasn't tried lyrica yet. D/w pt.    He'll notify me if he wants to go for hearing aids.    D/w patient [id number] for colon cancer screening, including IFOB vs. colonoscopy.  Risks and benefits of both were discussed and patient voiced understanding.  Pt elects [id number].  See following phone noted.  Apparently, patient has FH colon and prostate cancer.   Needs colonoscopy and PSA done.   Referral for GI eval placed.  PSA ordered for next set of labs.    PMH and SH reviewed  ROS: Per HPI unless specifically indicated in ROS section   Meds, vitals, and allergies reviewed.   GEN: nad, alert and oriented HEENT: mucous membranes moist NECK: supple w/o LA CV: rrr. PULM: ctab, no inc wob ABD: soft, +bs EXT: no edema SKIN: no acute rash

## 2016-04-30 ENCOUNTER — Encounter: Payer: Self-pay | Admitting: Family Medicine

## 2016-04-30 ENCOUNTER — Telehealth: Payer: Self-pay

## 2016-04-30 DIAGNOSIS — Z800 Family history of malignant neoplasm of digestive organs: Secondary | ICD-10-CM | POA: Insufficient documentation

## 2016-04-30 DIAGNOSIS — Z125 Encounter for screening for malignant neoplasm of prostate: Secondary | ICD-10-CM

## 2016-04-30 DIAGNOSIS — Z7189 Other specified counseling: Secondary | ICD-10-CM | POA: Insufficient documentation

## 2016-04-30 DIAGNOSIS — Z8042 Family history of malignant neoplasm of prostate: Secondary | ICD-10-CM | POA: Insufficient documentation

## 2016-04-30 NOTE — Telephone Encounter (Signed)
V/M left; pt was seen on 04/29/16 and was to cb if family hx of prostate CA; pts father had prostate CA and colon CA. FYI to Dr Damita Dunnings.

## 2016-04-30 NOTE — Assessment & Plan Note (Signed)
He hasn't tried lyrica yet. D/w pt.  He'll try that and then update me. Okay for outpatient f/u.

## 2016-04-30 NOTE — Assessment & Plan Note (Signed)
Needs colonoscopy done.   Referral for GI eval placed.

## 2016-04-30 NOTE — Assessment & Plan Note (Signed)
Inc back to 100mg  zoloft and patient to update me as needed. >25 minutes spent in face to face time with patient, >50% spent in counselling or coordination of care.

## 2016-04-30 NOTE — Telephone Encounter (Signed)
Needs colonoscopy and PSA done.   Referral for GI eval placed.  PSA ordered for next set of labs.   Thanks.

## 2016-04-30 NOTE — Assessment & Plan Note (Signed)
PSA ordered for next set of labs.  See following phon note.

## 2016-05-03 ENCOUNTER — Encounter: Payer: Self-pay | Admitting: Gastroenterology

## 2016-05-03 NOTE — Telephone Encounter (Signed)
Patient advised.  Lab appt scheduled.  

## 2016-05-05 ENCOUNTER — Telehealth: Payer: Self-pay | Admitting: Family Medicine

## 2016-05-05 ENCOUNTER — Other Ambulatory Visit (INDEPENDENT_AMBULATORY_CARE_PROVIDER_SITE_OTHER): Payer: Medicare Other

## 2016-05-05 DIAGNOSIS — Z125 Encounter for screening for malignant neoplasm of prostate: Secondary | ICD-10-CM | POA: Diagnosis not present

## 2016-05-05 LAB — PSA, MEDICARE: PSA: 0.25 ng/mL (ref 0.10–4.00)

## 2016-05-05 NOTE — Telephone Encounter (Signed)
Patient's friend [name] on [place]) notified as instructed by telephone and verbalized understanding. Patient was not at home at the time.

## 2016-05-05 NOTE — Telephone Encounter (Signed)
Glad to hear he is doing better.  Would continue as is.  Thanks for update.

## 2016-05-05 NOTE — Telephone Encounter (Signed)
Pt wants you to know the last med you prescribed is working. He feels 75% better. Thank you!

## 2016-05-19 ENCOUNTER — Encounter: Payer: Self-pay | Admitting: Family Medicine

## 2016-05-19 ENCOUNTER — Telehealth: Payer: Self-pay | Admitting: Family Medicine

## 2016-05-19 NOTE — Telephone Encounter (Signed)
Christy called stating mr Matthew Norton is in office now.  He told her he has had stints place and she wanted to know if he needed to be pre medicated

## 2016-05-19 NOTE — Telephone Encounter (Signed)
[name] Can you help with this. Pt is at dentist office now.  I sent urgent to dr Damita Dunnings per adreinne said I could send to you also

## 2016-05-19 NOTE — Telephone Encounter (Signed)
Without h/o sig valve disease the patient should not need abx treatment for dental work.  It is noted that the patient in the dental clinic and I'll defer to the dentist.

## 2016-05-19 NOTE — Telephone Encounter (Signed)
Pt was notified  And verbalized understanding.

## 2016-06-07 IMAGING — MR MRI CERVICAL SPINE WITHOUT CONTRAST
5 series · 39 of 48 positions shown · non-contrast
Comparison: Cervical MRI 12/04/2005

CLINICAL DATA: Chronic posterior neck pain with left arm pain

EXAM:
MRI CERVICAL SPINE WITHOUT CONTRAST
TECHNIQUE: Multiplanar, multisequence MR imaging of the cervical spine was
performed. No intravenous contrast was administered.

[Series 2: T2 · sagittal · 3.0mm · 0.56mm/px · 7 of 13 slices shown (1 of 2)]
[im 1/13]
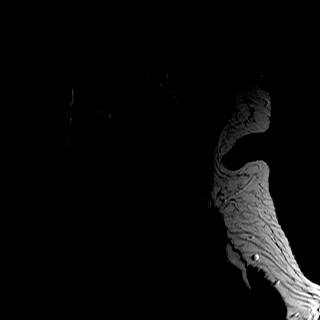
[im 3/13]
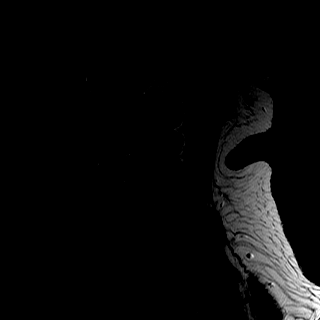
[im 5/13]
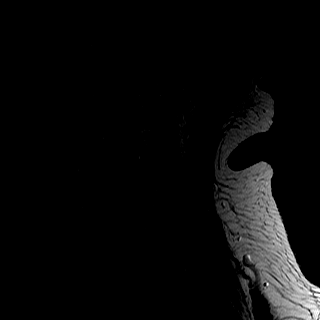
[im 7/13]
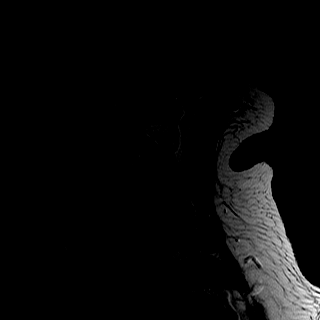
[im 9/13]
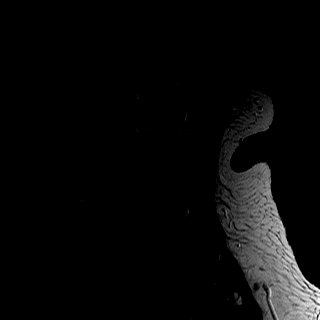
[im 11/13]
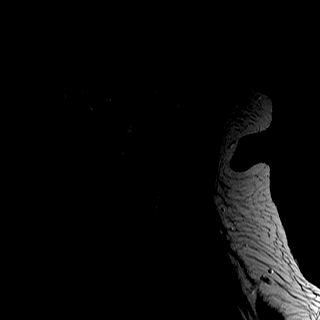
[im 13/13]
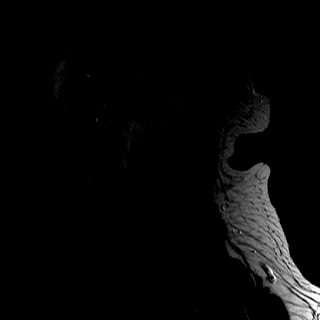

[Series 3: T1 · sagittal · 3.0mm · 0.70mm/px · 7 of 13 slices shown]
[im 1/13]
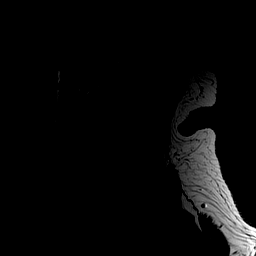
[im 3/13]
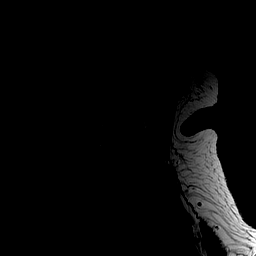
[im 5/13]
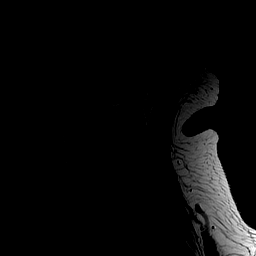
[im 7/13]
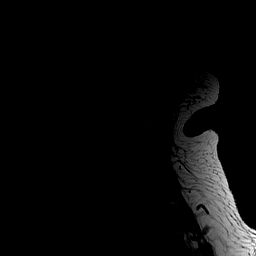
[im 9/13]
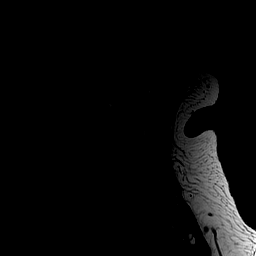
[im 11/13]
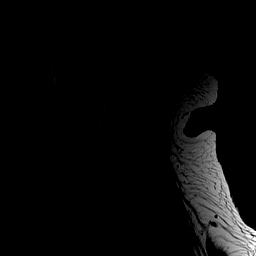
[im 13/13]
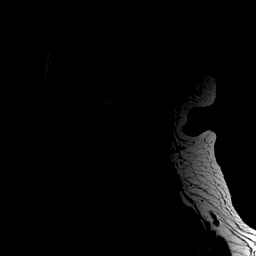

[Series 4: STIR · sagittal · 3.0mm · 0.70mm/px · 6 of 13 slices shown]
[im 1/13]
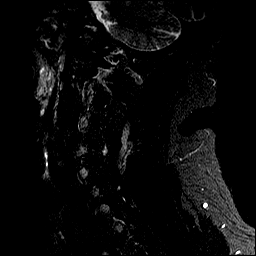
[im 3/13]
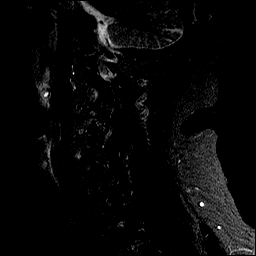
[im 5/13]
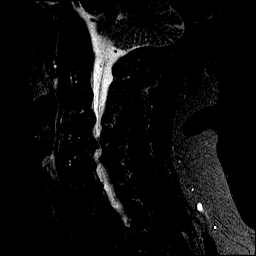
[im 8/13]
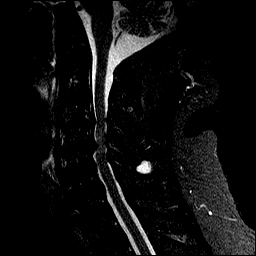
[im 10/13]
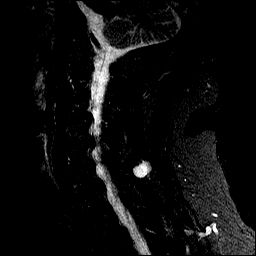
[im 13/13]
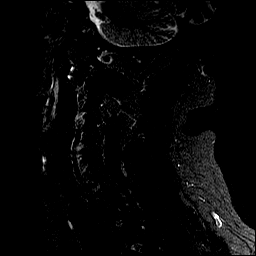

[Series 5: T2 · axial · 3.0mm · 0.70mm/px · z∈[-30,+77]mm · 11 of 29 slices shown (2 of 2)]
[im 1/29]
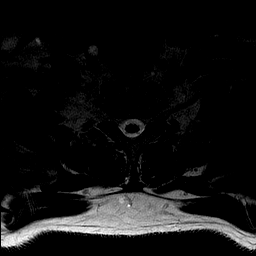
[im 3/29]
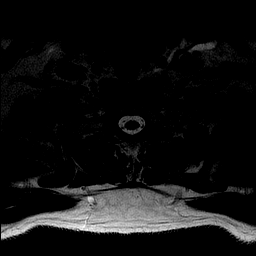
[im 5/29]
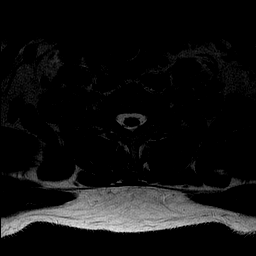
[im 7/29]
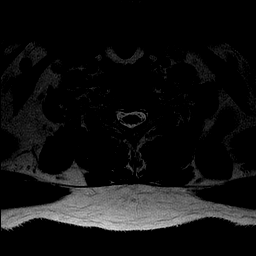
[im 9/29]
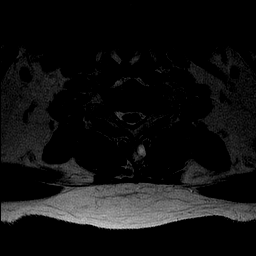
[im 11/29]
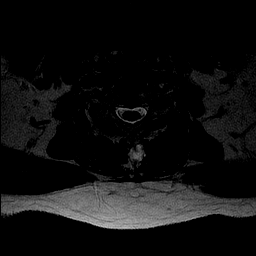
[im 13/29]
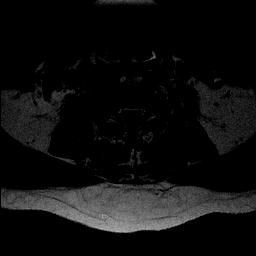
[im 16/29]
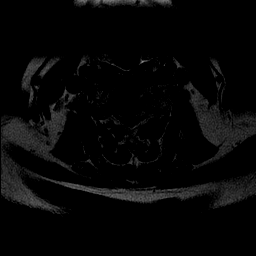
[im 20/29]
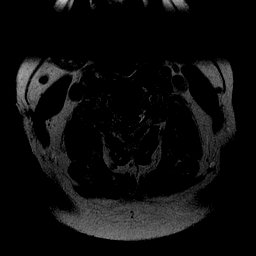
[im 24/29]
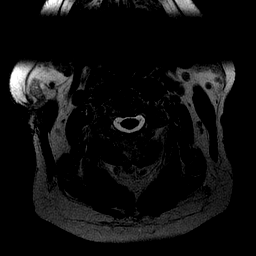
[im 29/29]
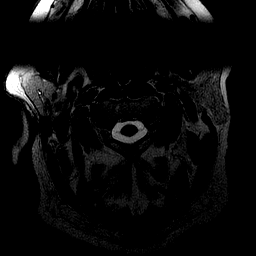

[Series 6: mpgr ax · axial · 3.0mm · 0.35mm/px · z∈[-30,+77]mm · 8 of 29 slices shown]
[im 1/29]
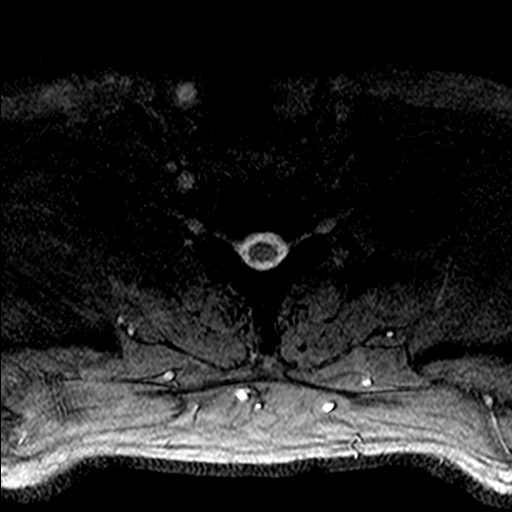
[im 5/29]
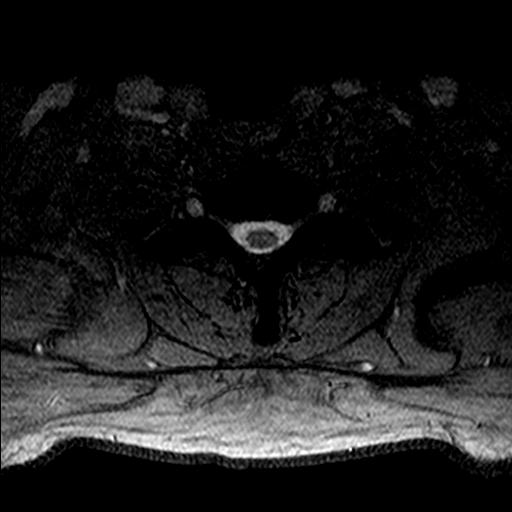
[im 9/29]
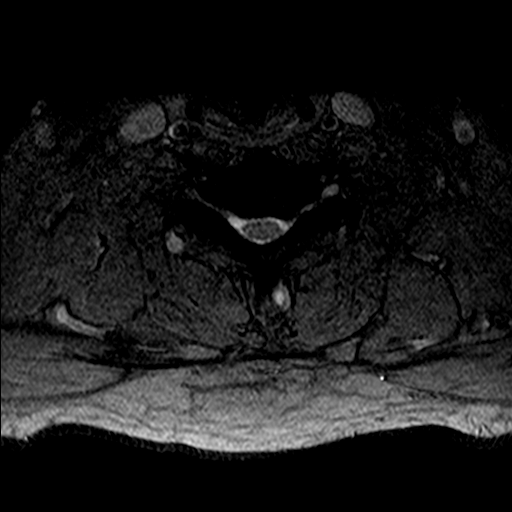
[im 13/29]
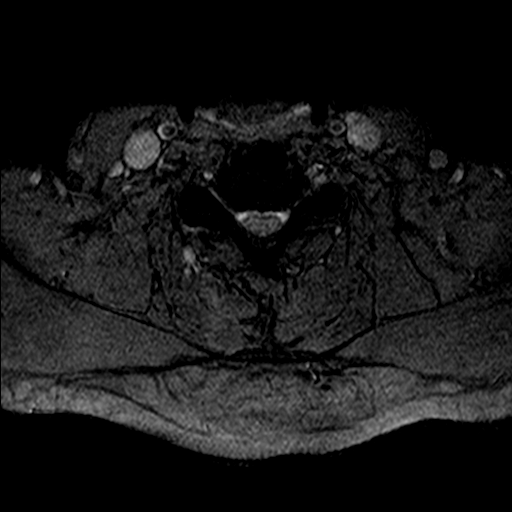
[im 16/29]
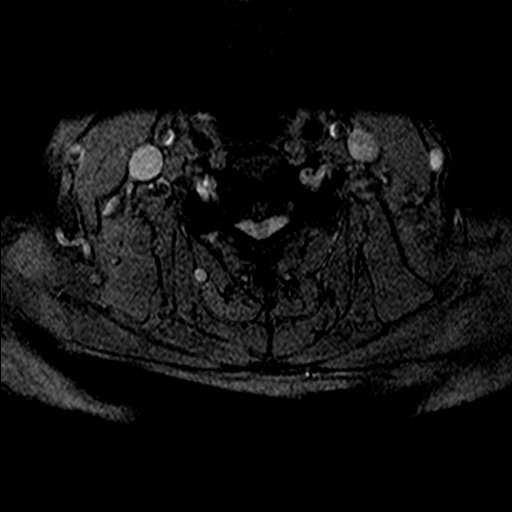
[im 20/29]
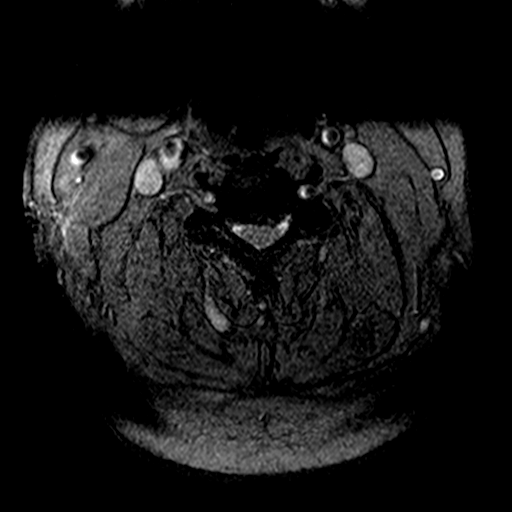
[im 24/29]
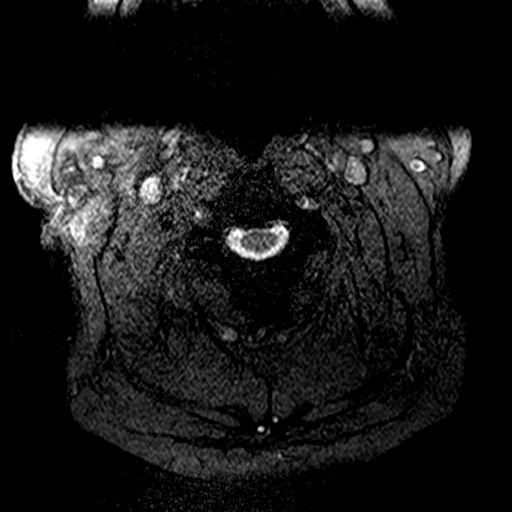
[im 29/29]
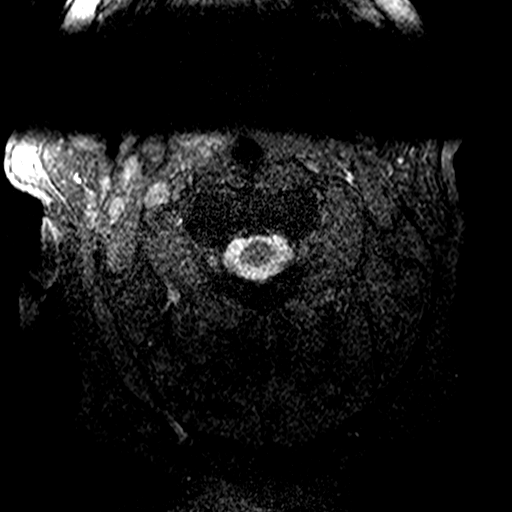

[39 of 48 positions shown; findings below may reference images not displayed]

FINDINGS: Normal cervical alignment.  Negative for fracture.

Soft tissue cyst adjacent to the C5 and C6 spinous processes just to
the left of midline shows mild interval growth now measuring 13 x 14
mm, previously measuring approximately 6 x 10 mm. This appears to be
within the soft tissues without bony component. Etiology is
uncertain.

C2-3: Mild disc degeneration and spurring without significant spinal
stenosis. Mild left foraminal narrowing.

C3-4: Disc degeneration and spondylosis. There is cord flattening
and moderate spinal stenosis which has progressed. Moderate
foraminal encroachment bilaterally has progressed.

C4-5: Severe spinal stenosis has progressed. There is a moderately
large central disc protrusion which has developed since the prior
study. Extensive uncinate spurring contributes to spinal stenosis
and severe foraminal encroachment bilaterally which has progressed.
Bilateral cord hyperintensity is present and unchanged from the
prior study. Bilateral facet hypertrophy contributes to spinal
stenosis.

C5-6: Disc degeneration and spondylosis has progressed causing mild
to moderate spinal stenosis and moderate foraminal encroachment
bilaterally. Bilateral facet hypertrophy

C6-7: Disc degeneration and spondylosis left greater than right
causing bilateral foraminal encroachment unchanged from the prior
study. Mild spinal stenosis unchanged.

C7-T1:  Negative
IMPRESSION: Moderate spinal stenosis at C3-4 has progressed since 7003

Severe spinal stenosis at C4-5 has progressed. There is a central
disc protrusion as well as extensive uncinate spurring contributing
to spinal stenosis and severe foraminal encroachment bilaterally.
Bilateral cord hyperintensity at C4-5 is unchanged from the prior
MRI

Mild to moderate spinal stenosis C5-6 has progressed.

Mild spinal stenosis and moderate foraminal encroachment bilaterally
at C6-7 is similar to the prior study put

13 x 14 mm cyst between the spinous processes of C5 and C6 shows
mild interval growth. No definite bony involvement.

## 2016-06-10 ENCOUNTER — Other Ambulatory Visit: Payer: Self-pay | Admitting: Family Medicine

## 2016-06-10 NOTE — Telephone Encounter (Signed)
Please call in.  Thanks.   

## 2016-06-10 NOTE — Telephone Encounter (Signed)
Electronic refill request. Last Filled:    60 tablet 1 04/11/2016  Please advise.

## 2016-06-11 NOTE — Telephone Encounter (Signed)
Medication phoned to pharmacy.  

## 2016-06-28 ENCOUNTER — Encounter: Payer: PRIVATE HEALTH INSURANCE | Admitting: Gastroenterology

## 2016-07-15 ENCOUNTER — Other Ambulatory Visit: Payer: Self-pay | Admitting: Family Medicine

## 2016-08-09 ENCOUNTER — Other Ambulatory Visit: Payer: Self-pay | Admitting: Family Medicine

## 2016-08-09 NOTE — Telephone Encounter (Signed)
Received refill electronically Last refill 06/10/16 #60/1 Last office visit 04/29/16

## 2016-08-10 NOTE — Telephone Encounter (Signed)
Please call in.  Thanks.   

## 2016-08-10 NOTE — Telephone Encounter (Signed)
Medication phoned to pharmacy.  

## 2016-10-12 ENCOUNTER — Other Ambulatory Visit: Payer: Self-pay | Admitting: Family Medicine

## 2016-10-12 NOTE — Telephone Encounter (Signed)
Electronic refill request. Last Filled:    60 tablet 1 08/10/2016  Please advise.

## 2016-10-13 NOTE — Telephone Encounter (Signed)
Rx called to pharmacy as instructed. 

## 2016-10-13 NOTE — Telephone Encounter (Signed)
Please call in.  Thanks.   

## 2016-10-25 ENCOUNTER — Encounter (INDEPENDENT_AMBULATORY_CARE_PROVIDER_SITE_OTHER): Payer: Medicare Other

## 2016-10-25 ENCOUNTER — Ambulatory Visit (INDEPENDENT_AMBULATORY_CARE_PROVIDER_SITE_OTHER): Payer: Self-pay | Admitting: Vascular Surgery

## 2016-11-08 ENCOUNTER — Ambulatory Visit (INDEPENDENT_AMBULATORY_CARE_PROVIDER_SITE_OTHER): Payer: Medicare Other | Admitting: Vascular Surgery

## 2016-11-08 ENCOUNTER — Encounter (INDEPENDENT_AMBULATORY_CARE_PROVIDER_SITE_OTHER): Payer: Medicare Other

## 2016-12-09 ENCOUNTER — Encounter (INDEPENDENT_AMBULATORY_CARE_PROVIDER_SITE_OTHER): Payer: Medicare Other

## 2016-12-09 ENCOUNTER — Ambulatory Visit (INDEPENDENT_AMBULATORY_CARE_PROVIDER_SITE_OTHER): Payer: Medicare Other | Admitting: Vascular Surgery

## 2017-01-25 ENCOUNTER — Other Ambulatory Visit: Payer: Self-pay | Admitting: Family Medicine

## 2017-02-07 ENCOUNTER — Other Ambulatory Visit: Payer: Self-pay | Admitting: Family Medicine

## 2017-02-07 NOTE — Telephone Encounter (Signed)
Please call in.  Thanks.   

## 2017-02-07 NOTE — Telephone Encounter (Signed)
Electronic refill request. Last office visit:   Appt scheduled 02/22/17. Last Filled:    60 tablet 1 10/13/2016  Please advise.

## 2017-02-08 ENCOUNTER — Other Ambulatory Visit: Payer: Self-pay | Admitting: Family Medicine

## 2017-02-08 NOTE — Telephone Encounter (Signed)
Medication phoned to pharmacy.  

## 2017-02-10 ENCOUNTER — Ambulatory Visit: Payer: Medicare Other | Admitting: Family Medicine

## 2017-02-22 ENCOUNTER — Encounter: Payer: Self-pay | Admitting: Family Medicine

## 2017-02-22 ENCOUNTER — Ambulatory Visit (INDEPENDENT_AMBULATORY_CARE_PROVIDER_SITE_OTHER): Payer: Medicare Other | Admitting: Family Medicine

## 2017-02-22 ENCOUNTER — Encounter: Payer: Self-pay | Admitting: *Deleted

## 2017-02-22 VITALS — BP 132/68 | HR 64 | Temp 98.8°F | Wt 203.5 lb

## 2017-02-22 DIAGNOSIS — Z1211 Encounter for screening for malignant neoplasm of colon: Secondary | ICD-10-CM

## 2017-02-22 DIAGNOSIS — F419 Anxiety disorder, unspecified: Secondary | ICD-10-CM

## 2017-02-22 DIAGNOSIS — F329 Major depressive disorder, single episode, unspecified: Secondary | ICD-10-CM

## 2017-02-22 NOTE — Progress Notes (Addendum)
F/u for lab results.  Per patient he had home test for blood in stool.  This was done via insurance company at a home visit.  He has FH colon CA and didn't need the test done anyway, since he was prev referred to GI.  D/w pt.    Mood.  No suicidal or homicidal intent. He is not sleeping as well and he has some changes in nighttime dreams. He is fatigued and he attributed some of this to BZD use. He can be jumpy and slightly irritable. He was asking about options. Still compliant with sertraline.  Meds, vitals, and allergies reviewed.   ROS: Per HPI unless specifically indicated in ROS section   GEN: nad, alert and oriented, speech within normal limits. Affect within normal limits. HEENT: mucous membranes moist NECK: supple w/o LA CV: rrr. PULM: ctab, no inc wob ABD: soft, +bs

## 2017-02-22 NOTE — Patient Instructions (Signed)
Matthew Norton will call about your referral.

## 2017-02-23 DIAGNOSIS — Z1211 Encounter for screening for malignant neoplasm of colon: Secondary | ICD-10-CM | POA: Insufficient documentation

## 2017-02-23 NOTE — Assessment & Plan Note (Signed)
Slowly taper Valium. Cut back to half tab a day for 2 weeks. Then stop the medication. Continue sertraline. Update me as needed. No suicidal or homicidal intent. Okay for outpatient follow-up.

## 2017-02-23 NOTE — Assessment & Plan Note (Signed)
>  25 minutes spent in face to face time with patient, >50% spent in counselling or coordination of care.   He allegedly had a stool test that was positive for occult blood. I do not have any verification of this that I can see. He didn't need the test done anyway since he needs colon cancer screening with colonoscopy due to family history of colon cancer. Refer to GI. Discussed with patient again today, he was previously referred but did not follow-up.

## 2017-03-03 ENCOUNTER — Telehealth: Payer: Self-pay

## 2017-03-03 ENCOUNTER — Other Ambulatory Visit: Payer: Self-pay

## 2017-03-03 DIAGNOSIS — Z1211 Encounter for screening for malignant neoplasm of colon: Secondary | ICD-10-CM

## 2017-03-03 NOTE — Telephone Encounter (Signed)
Gastroenterology Pre-Procedure Review  Request Date: 03/31/17 Requesting Physician: Dr. Vicente Males  PATIENT REVIEW QUESTIONS: The patient responded to the following health history questions as indicated:    1. Are you having any GI issues? no 2. Do you have a personal history of Polyps? no 3. Do you have a family history of Colon Cancer or Polyps? yes (Father Colon Cancer) 4. Diabetes Mellitus? no 5. Joint replacements in the past 12 months?no 6. Major health problems in the past 3 months?no 7. Any artificial heart valves, MVP, or defibrillator?yes (Stent )    MEDICATIONS & ALLERGIES:    Patient reports the following regarding taking any anticoagulation/antiplatelet therapy:   Plavix, Coumadin, Eliquis, Xarelto, Lovenox, Pradaxa, Brilinta, or Effient? no Aspirin? yes (81 mg daily)  Patient confirms/reports the following medications:  Current Outpatient Prescriptions  Medication Sig Dispense Refill  . acetaminophen (TYLENOL) 500 MG tablet Take 2 tablets (1,000 mg total) by mouth every 8 (eight) hours as needed (for pain).    Marland Kitchen aspirin 81 MG tablet Take 81 mg by mouth daily.    . diazepam (VALIUM) 5 MG tablet TAKE ONE TABLET BY MOUTH EVERY 12 HOURS AS NEEDED 60 tablet 1  . loratadine (CLARITIN) 10 MG tablet Take 1 tablet (10 mg total) by mouth daily.    Marland Kitchen losartan (COZAAR) 25 MG tablet TAKE ONE TABLET BY MOUTH ONCE DAILY 90 tablet 0  . nitroGLYCERIN (NITROSTAT) 0.4 MG SL tablet Place under the tongue every 5 (five) minutes as needed for chest pain. Reported on 04/29/2016    . sertraline (ZOLOFT) 100 MG tablet Take 1 tablet (100 mg total) by mouth daily. 90 tablet 3   No current facility-administered medications for this visit.     Patient confirms/reports the following allergies:  Allergies  Allergen Reactions  . Bupropion Other (See Comments)    Unspecified  . Flexeril [Cyclobenzaprine] Other (See Comments)    Likely sedation reaction  . Gabapentin Other (See Comments)   Headache,weakness  . Hydrochlorothiazide Other (See Comments)    Unspecified  . Lipitor [Atorvastatin]     aches  . Paroxetine Hcl Other (See Comments)    Unspecified  . Pravastatin Other (See Comments)    nightmares  . Venlafaxine Other (See Comments)    Unspecified    No orders of the defined types were placed in this encounter.   AUTHORIZATION INFORMATION Primary Insurance: 1D#: Group #:  Secondary Insurance: 1D#: Group #:  SCHEDULE INFORMATION: Date: 03/31/17 Dr. Vicente Males Time: Location: [place]

## 2017-03-04 ENCOUNTER — Telehealth: Payer: Self-pay | Admitting: Gastroenterology

## 2017-03-04 NOTE — Telephone Encounter (Signed)
Pt has been asked to contact Dr. Suzan Garibaldi office for Cardiac clearance for colonoscopy.

## 2017-03-04 NOTE — Telephone Encounter (Signed)
Sandy at Dr. Kristeen Miss office called and stated that we need to call the patient and tell him to make an appt with their office for clearance. He hasn't been seen since 2016. Office number is [phone]

## 2017-03-08 ENCOUNTER — Telehealth: Payer: Self-pay | Admitting: Gastroenterology

## 2017-03-08 NOTE — Telephone Encounter (Signed)
03/09/17 NO prior auth required for [facility] for EGD [id number] / Dysphagia

## 2017-03-10 ENCOUNTER — Telehealth: Payer: Self-pay

## 2017-03-10 ENCOUNTER — Telehealth: Payer: Self-pay | Admitting: Gastroenterology

## 2017-03-10 MED ORDER — DIAZEPAM 2 MG PO TABS: ORAL_TABLET | ORAL | 0 refills | Status: DC

## 2017-03-10 MED ORDER — HYDROXYZINE HCL 10 MG PO TABS: 10.0000 mg | ORAL_TABLET | Freq: Two times a day (BID) | ORAL | 1 refills | Status: DC | PRN

## 2017-03-10 NOTE — Telephone Encounter (Signed)
Noted. Let's prolong valium taper - I've sent in 2mg  dose to take one tablet daily for 1 wk then 1/2 tablet daily for 1 week then may stop. Ok to use hydroxyzine after valium taper completed.  Let us know if any questions or concerns.

## 2017-03-10 NOTE — Telephone Encounter (Signed)
plz ensure no other withdrawal sxs - sweating, panic attack, headache, muscle pain, nausea, tremors, agitation.  If just jitteriness, ok to try hydroxyzine 10 mg 1-2 tab BID PRN anxiety. Sent this to pharmacy. Caution it can make him sleepy - don't take and drive.  Will cc PCP as Juluis Rainier

## 2017-03-10 NOTE — Telephone Encounter (Signed)
Thanks Agree 

## 2017-03-10 NOTE — Telephone Encounter (Signed)
03/10/17 [employer] spoke with Helene Kelp and NO prior auth required for colonoscopy [phone] / Z12.11.

## 2017-03-10 NOTE — Telephone Encounter (Signed)
Patient advised.

## 2017-03-10 NOTE — Telephone Encounter (Signed)
Pt left v/m; pt seen 02/22/17 when pt was to taper off valium; pt has stopped taking Valium now and pt is very jittery. Pt request med for jitteriness; [facility] [address]. Pt request cb.

## 2017-03-10 NOTE — Telephone Encounter (Signed)
Wife advised. Patient is having profuse sweating and some leg pain.

## 2017-03-21 DIAGNOSIS — Z01810 Encounter for preprocedural cardiovascular examination: Secondary | ICD-10-CM | POA: Insufficient documentation

## 2017-03-31 ENCOUNTER — Encounter: Payer: Self-pay | Admitting: *Deleted

## 2017-03-31 ENCOUNTER — Encounter
Admission: RE | Disposition: A | Discharge status: Home / Self Care | Payer: Self-pay | Source: Ambulatory Visit | Attending: Gastroenterology

## 2017-03-31 ENCOUNTER — Ambulatory Visit: Payer: Medicare HMO | Admitting: Anesthesiology

## 2017-03-31 ENCOUNTER — Ambulatory Visit
Admission: RE | Admit: 2017-03-31 | Discharge: 2017-03-31 | Disposition: A | Discharge status: Home / Self Care | Payer: Medicare HMO | Source: Ambulatory Visit | Attending: Gastroenterology | Admitting: Gastroenterology

## 2017-03-31 DIAGNOSIS — D120 Benign neoplasm of cecum: Secondary | ICD-10-CM | POA: Diagnosis not present

## 2017-03-31 DIAGNOSIS — F17200 Nicotine dependence, unspecified, uncomplicated: Secondary | ICD-10-CM | POA: Insufficient documentation

## 2017-03-31 DIAGNOSIS — D123 Benign neoplasm of transverse colon: Secondary | ICD-10-CM | POA: Diagnosis not present

## 2017-03-31 DIAGNOSIS — I739 Peripheral vascular disease, unspecified: Secondary | ICD-10-CM | POA: Insufficient documentation

## 2017-03-31 DIAGNOSIS — Z6829 Body mass index (BMI) 29.0-29.9, adult: Secondary | ICD-10-CM | POA: Insufficient documentation

## 2017-03-31 DIAGNOSIS — D122 Benign neoplasm of ascending colon: Secondary | ICD-10-CM | POA: Diagnosis not present

## 2017-03-31 DIAGNOSIS — Q439 Congenital malformation of intestine, unspecified: Secondary | ICD-10-CM | POA: Insufficient documentation

## 2017-03-31 DIAGNOSIS — F419 Anxiety disorder, unspecified: Secondary | ICD-10-CM | POA: Insufficient documentation

## 2017-03-31 DIAGNOSIS — F17210 Nicotine dependence, cigarettes, uncomplicated: Secondary | ICD-10-CM | POA: Insufficient documentation

## 2017-03-31 DIAGNOSIS — Z800 Family history of malignant neoplasm of digestive organs: Secondary | ICD-10-CM

## 2017-03-31 DIAGNOSIS — Z1211 Encounter for screening for malignant neoplasm of colon: Secondary | ICD-10-CM | POA: Insufficient documentation

## 2017-03-31 DIAGNOSIS — I2510 Atherosclerotic heart disease of native coronary artery without angina pectoris: Secondary | ICD-10-CM | POA: Insufficient documentation

## 2017-03-31 DIAGNOSIS — Z79899 Other long term (current) drug therapy: Secondary | ICD-10-CM | POA: Insufficient documentation

## 2017-03-31 DIAGNOSIS — Z955 Presence of coronary angioplasty implant and graft: Secondary | ICD-10-CM | POA: Insufficient documentation

## 2017-03-31 DIAGNOSIS — E785 Hyperlipidemia, unspecified: Secondary | ICD-10-CM | POA: Insufficient documentation

## 2017-03-31 DIAGNOSIS — Z7982 Long term (current) use of aspirin: Secondary | ICD-10-CM | POA: Insufficient documentation

## 2017-03-31 DIAGNOSIS — K5730 Diverticulosis of large intestine without perforation or abscess without bleeding: Secondary | ICD-10-CM | POA: Insufficient documentation

## 2017-03-31 DIAGNOSIS — F329 Major depressive disorder, single episode, unspecified: Secondary | ICD-10-CM | POA: Insufficient documentation

## 2017-03-31 DIAGNOSIS — D125 Benign neoplasm of sigmoid colon: Secondary | ICD-10-CM | POA: Insufficient documentation

## 2017-03-31 DIAGNOSIS — I10 Essential (primary) hypertension: Secondary | ICD-10-CM | POA: Insufficient documentation

## 2017-03-31 HISTORY — PX: COLONOSCOPY WITH PROPOFOL: SHX5780

## 2017-03-31 SURGERY — COLONOSCOPY WITH PROPOFOL
Anesthesia: General

## 2017-03-31 MED ORDER — SODIUM CHLORIDE 0.9 % IV SOLN
INTRAVENOUS | Status: DC
  Administered 2017-03-31 (×2): via INTRAVENOUS

## 2017-03-31 MED ORDER — PROPOFOL 500 MG/50ML IV EMUL
INTRAVENOUS | Status: DC | PRN
  Administered 2017-03-31: 200 ug/kg/min via INTRAVENOUS

## 2017-03-31 MED ORDER — METHYLENE BLUE 0.5 % INJ SOLN
INTRAVENOUS | Status: DC | PRN
  Administered 2017-03-31 (×2): 2 mL via SUBMUCOSAL

## 2017-03-31 MED ORDER — LIDOCAINE HCL (CARDIAC) 20 MG/ML IV SOLN
INTRAVENOUS | Status: DC | PRN
  Administered 2017-03-31: 80 mg via INTRAVENOUS

## 2017-03-31 MED ORDER — GLYCOPYRROLATE 0.2 MG/ML IJ SOLN
INTRAMUSCULAR | Status: DC | PRN
  Administered 2017-03-31: 0.2 mg via INTRAVENOUS

## 2017-03-31 MED ORDER — PROPOFOL 500 MG/50ML IV EMUL
INTRAVENOUS | Status: AC
  Filled 2017-03-31: qty 50

## 2017-03-31 MED ORDER — PROPOFOL 10 MG/ML IV BOLUS
INTRAVENOUS | Status: AC
  Filled 2017-03-31: qty 20

## 2017-03-31 MED ORDER — PHENYLEPHRINE HCL 10 MG/ML IJ SOLN
INTRAMUSCULAR | Status: DC | PRN
  Administered 2017-03-31 (×4): 200 ug via INTRAVENOUS

## 2017-03-31 MED ORDER — PROPOFOL 10 MG/ML IV BOLUS
INTRAVENOUS | Status: DC | PRN
  Administered 2017-03-31: 30 mg via INTRAVENOUS

## 2017-03-31 MED ORDER — EPHEDRINE SULFATE 50 MG/ML IJ SOLN
INTRAMUSCULAR | Status: DC | PRN
  Administered 2017-03-31 (×4): 10 mg via INTRAVENOUS

## 2017-03-31 MED ORDER — LIDOCAINE HCL (PF) 2 % IJ SOLN
INTRAMUSCULAR | Status: AC
  Filled 2017-03-31: qty 2

## 2017-03-31 MED ORDER — EPHEDRINE SULFATE 50 MG/ML IJ SOLN
INTRAMUSCULAR | Status: AC
  Filled 2017-03-31: qty 1

## 2017-03-31 MED ORDER — PHENYLEPHRINE HCL 10 MG/ML IJ SOLN
INTRAMUSCULAR | Status: AC
  Filled 2017-03-31: qty 1

## 2017-03-31 NOTE — H&P (Signed)
Jonathon Bellows MD [address], [place],  [address] Phone: [phone] Fax : [phone]  Primary Care Physician:  Tonia Ghent, MD Primary Gastroenterologist:  Dr. Jonathon Bellows   Pre-Procedure History & Physical: HPI:  Matthew Norton is a 71 y.o. male is here for an colonoscopy.   Past Medical History:  Diagnosis Date  . Anxiety    takes Valium daily as needed  . Arthritis   . Cataracts, bilateral   . Complication of anesthesia    slow to wake up after one surgery  . Coronary artery disease   . Depression   . Hyperlipemia    has tried meds but not on any bc of joint aches and pains  . Hypertension    takes Metoprolol and Losartan daily  . Insomnia   . Joint pain   . Peripheral edema   . Shortness of breath    with exertion   . Weakness    numbness in both hands and feet.    Past Surgical History:  Procedure Laterality Date  . ANTERIOR CERVICAL DECOMP/DISCECTOMY FUSION N/A 03/24/2015   Procedure: Cervical four- five, anterior cervical decompression with fusion, plating and bonegraft;  Surgeon: Jovita Gamma, MD;  Location: [facility];  Service: Neurosurgery;  Laterality: N/A;  C45 anterior cervical decompression with fusion plating and bonegraft  . BACK SURGERY  2001   lumb lam  . CARDIAC CATHETERIZATION  08,11   stents x2  . [place]   fx rt   . CORONARY ANGIOPLASTY     2 stents  . ESOPHAGOGASTRODUODENOSCOPY    . HERNIA REPAIR  2007   rt and lt ing   . NAILBED REPAIR Left 09/13/2013   Procedure: LEFT THUMB NAIL REMOVAL, NAIL BED RECONSTRUCTION PARTIAL EXCISION DISTAL PHALANX WITH COMPLEX RECONSTRUCTION ;  Surgeon: Roseanne Kaufman, MD;  Location: [facility];  Service: Orthopedics;  Laterality: Left;  . parotid mass removed    . REPLANTATION THUMB  2003   left    Prior to Admission medications   Medication Sig Start Date End Date Taking? Authorizing Provider  acetaminophen (TYLENOL) 500 MG tablet Take 2 tablets  (1,000 mg total) by mouth every 8 (eight) hours as needed (for pain). 03/01/16  Yes Tonia Ghent, MD  aspirin 81 MG tablet Take 81 mg by mouth daily.   Yes [provider]  diazepam (VALIUM) 2 MG tablet Take 1 tablet daily for 1 week then 1/2 tablet daily for 1 week then stop 03/10/17  Yes Ria Bush, MD  hydrOXYzine (ATARAX/VISTARIL) 10 MG tablet Take 1-2 tablets (10-20 mg total) by mouth 2 (two) times daily as needed for anxiety. 03/10/17  Yes Ria Bush, MD  loratadine (CLARITIN) 10 MG tablet Take 1 tablet (10 mg total) by mouth daily. 03/15/16  Yes Tonia Ghent, MD  losartan (COZAAR) 25 MG tablet TAKE ONE TABLET BY MOUTH ONCE DAILY 01/25/17  Yes Tonia Ghent, MD  sertraline (ZOLOFT) 100 MG tablet Take 1 tablet (100 mg total) by mouth daily. 04/29/16  Yes Tonia Ghent, MD  nitroGLYCERIN (NITROSTAT) 0.4 MG SL tablet Place under the tongue every 5 (five) minutes as needed for chest pain. Reported on 04/29/2016    [provider]    Allergies as of 03/03/2017 - Review Complete 02/22/2017  Allergen Reaction Noted  . Bupropion Other (See Comments) 12/04/2014  . Flexeril [cyclobenzaprine] Other (See Comments) 10/25/2014  . Gabapentin Other (See Comments) 03/25/2016  . Hydrochlorothiazide  Other (See Comments) 12/04/2014  . Lipitor [atorvastatin]  04/29/2016  . Paroxetine hcl Other (See Comments) 12/04/2014  . Pravastatin Other (See Comments) 10/25/2014  . Venlafaxine Other (See Comments) 12/04/2014    Family History  Problem Relation Age of Onset  . Hypertension Mother   . Cancer Mother   . Cancer Father   . Colon cancer Father   . Prostate cancer Father     Social History   Social History  . Marital status: Single    Spouse name: N/A  . Number of children: N/A  . Years of education: N/A   Occupational History  . Not on file.   Social History Main Topics  . Smoking status: Current Every Day Smoker    Packs/day: 1.00    Years: 50.00  .  Smokeless tobacco: Never Used  . Alcohol use No  . Drug use: No  . Sexual activity: No   Other Topics Concern  . Not on file   Social History Narrative   2 kids   In relationship with long term girlfriend    Review of Systems: See HPI, otherwise negative ROS  Physical Exam: BP 133/77   Pulse 69   Temp 97.7 F (36.5 C) (Tympanic)   Resp 18   Ht 5\' 9"  (1.753 m)   Wt 200 lb (90.7 kg)   SpO2 100%   BMI 29.53 kg/m  General:   Alert,  pleasant and cooperative in NAD Head:  Normocephalic and atraumatic. Neck:  Supple; no masses or thyromegaly. Lungs:  Clear throughout to auscultation.    Heart:  Regular rate and rhythm. Abdomen:  Soft, nontender and nondistended. Normal bowel sounds, without guarding, and without rebound.   Neurologic:  Alert and  oriented x4;  grossly normal neurologically.  Impression/Plan: Matthew Norton is here for an colonoscopy to be performed for colorectal cancer screening - father had colon cancer   Risks, benefits, limitations, and alternatives regarding  colonoscopy have been reviewed with the patient.  Questions have been answered.  All parties agreeable.   Jonathon Bellows, MD  03/31/2017, 9:33 AM

## 2017-03-31 NOTE — Anesthesia Post-op Follow-up Note (Cosign Needed)
Anesthesia QCDR form completed.        

## 2017-03-31 NOTE — Anesthesia Preprocedure Evaluation (Addendum)
Anesthesia Evaluation  Patient identified by MRN, date of birth, ID band Patient awake    Reviewed: Allergy & Precautions, NPO status , Patient's Chart, lab work & pertinent test results, reviewed documented beta blocker date and time   History of Anesthesia Complications Negative for: history of anesthetic complications  Airway Mallampati: III  TM Distance: >3 FB Neck ROM: Full    Dental  (+) Missing,    Pulmonary shortness of breath and with exertion, neg sleep apnea, neg COPD, neg recent URI, Current Smoker,    breath sounds clear to auscultation       Cardiovascular hypertension, Pt. on medications and Pt. on home beta blockers (-) angina+ CAD, + Cardiac Stents and + Peripheral Vascular Disease  (-) Past MI (-) dysrhythmias  Rhythm:Regular     Neuro/Psych PSYCHIATRIC DISORDERS Anxiety Depression Peripheral neuropathy  Neuromuscular disease    GI/Hepatic negative GI ROS, Neg liver ROS,   Endo/Other  Morbid obesity  Renal/GU negative Renal ROS     Musculoskeletal  (+) Arthritis , Osteoarthritis,    Abdominal   Peds  Hematology negative hematology ROS (+)   Anesthesia Other Findings   Reproductive/Obstetrics                            Anesthesia Physical  Anesthesia Plan  ASA: III  Anesthesia Plan: General   Post-op Pain Management:    Induction: Intravenous  PONV Risk Score and Plan:   Airway Management Planned: Nasal Cannula  Additional Equipment: None  Intra-op Plan:   Post-operative Plan:   Informed Consent: I have reviewed the patients History and Physical, chart, labs and discussed the procedure including the risks, benefits and alternatives for the proposed anesthesia with the patient or authorized representative who has indicated his/her understanding and acceptance.   Dental advisory given  Plan Discussed with: CRNA and Surgeon  Anesthesia Plan Comments:          Anesthesia Quick Evaluation

## 2017-03-31 NOTE — Transfer of Care (Signed)
Immediate Anesthesia Transfer of Care Note  Patient: Matthew Norton  Procedure(s) Performed: Procedure(s): COLONOSCOPY WITH PROPOFOL (N/A)  Patient Location: Endoscopy Unit  Anesthesia Type:General  Level of Consciousness: awake, oriented and patient cooperative  Airway & Oxygen Therapy: Patient Spontanous Breathing and Patient connected to nasal cannula oxygen  Post-op Assessment: Report given to RN, Post -op Vital signs reviewed and stable and Patient moving all extremities X 4  Post vital signs: Reviewed and stable  Last Vitals:  Vitals:   03/31/17 0833 03/31/17 1107  BP: 133/77 (!) 82/52  Pulse: 69   Resp: 18 14  Temp: 36.5 C (!) 35.6 C    Last Pain:  Vitals:   03/31/17 1107  TempSrc: Tympanic         Complications: No apparent anesthesia complications

## 2017-03-31 NOTE — Op Note (Addendum)
[facility]astroenterology Patient Name: Matthew Norton Procedure Date: 03/31/2017 9:34 AM MRN: [id number] Account #: [id number] Date of Birth: April 17, 1946 Admit Type: Outpatient Age: 71 Room: [facility] ENDO ROOM 4 Gender: Male Note Status: Finalized Procedure:            Colonoscopy Providers:            Jonathon Bellows MD, MD Referring MD:         Elveria Rising. Damita Dunnings, MD (Referring MD) Medicines:            Monitored Anesthesia Care Complications:        No immediate complications. Procedure:            Pre-Anesthesia Assessment:                       - Prior to the procedure, a History and Physical was                        performed, and patient medications, allergies and                        sensitivities were reviewed. The patient's tolerance of                        previous anesthesia was reviewed.                       - The risks and benefits of the procedure and the                        sedation options and risks were discussed with the                        patient. All questions were answered and informed                        consent was obtained.                       - ASA Grade Assessment: III - A patient with severe                        systemic disease.                       - ASA Grade Assessment: III - A patient with severe                        systemic disease.                       After obtaining informed consent, the colonoscope was                        passed under direct vision. Throughout the procedure,                        the patient's blood pressure, pulse, and oxygen                        saturations were monitored continuously. The  Colonoscope was introduced through the anus and                        advanced to the the cecum, identified by the                        appendiceal orifice, IC valve and transillumination.                        The colonoscopy was unusually difficult due to a           tortuous colon and multiple polyps . Successful                        completion of the procedure was aided by {skip}. The                        quality of the bowel preparation was good. Findings:      A 20 mm polyp was found in the sigmoid colon. The polyp was       pedunculated. The polyp was removed with a hot snare. Resection and       retrieval were complete. To prevent bleeding after the polypectomy, two       hemostatic clips were successfully placed. There was no bleeding during,       or at the end, of the procedure.      A 10 mm polyp was found in the sigmoid colon. The polyp was       semi-pedunculated. The polyp was removed with a hot snare. Resection and       retrieval were complete.      A 8 mm polyp was found in the splenic flexure. The polyp was       semi-sessile. The polyp was removed with a hot snare. Resection and       retrieval were complete. To prevent bleeding post-intervention, one       hemostatic clip was successfully placed. There was no bleeding at the       end of the procedure.      Two sessile polyps were found in the transverse colon. The polyps were 8       mm in size. These polyps were removed with a hot snare. Resection and       retrieval were complete.      Two sessile polyps were found in the ascending colon. The polyps were 10       to 12 mm in size. These polyps were removed with a hot snare. Resection       and retrieval were complete.      Two sessile polyps were found in the ascending colon and cecum. The       polyps were 10 to 15 mm in size. Preparations were made for mucosal       resection. Saline with methylene blue was injected to raise the lesion.       Snare mucosal resection was performed. Resection and retrieval were       complete. To close a defect after mucosal resection, one hemostatic clip       was successfully placed. There was no bleeding during, or at the end, of       the procedure.      A 20 mm polyp was found  in the proximal ascending  colon. The polyp was       sessile. Preparations were made for mucosal resection. Saline with       methylene blue was injected to raise the lesion. Snare mucosal resection       was performed. Resection and retrieval were complete. To close a defect       after mucosal resection, four hemostatic clips were successfully placed.       There was no bleeding during, or at the end, of the procedure.      A few sessile polyps were found in the ascending colon and cecum. The       polyps were 7 to 8 mm in size. These polyps were removed with a hot       snare. Resection and retrieval were complete. There were few other       polyps seen but not resected due to porlonged duration of the procedure       they were 6-8 mm in size.      Multiple medium-mouthed diverticula were found in the entire colon.      The exam was otherwise without abnormality.      The procedure was unsually long and took 1 hour 15 minutes in comparison       to a normal 15 minutes due to tortiousity , multiple polyps and multiple       modalities of resection Impression:           - Multiple 9 to 20 mm, non-bleeding polyps [Site],                        removed with a hot snare, removed using injection-lift                        and a hot snare, removed with a cold snare and removed                        with mucosal resection. Resected and retrieved. [All                        Maneuvers]. Recommendation:       - Discharge patient to home (with escort).                       - Resume previous diet today.                       - No ibuprofen, naproxen, or other non-steroidal                        anti-inflammatory drugs for 6 weeks after polyp removal. Procedure Code(s):    --- Professional ---                       [phone], 59, Colonoscopy, flexible; with endoscopic                        mucosal resection                       [id number], Colonoscopy, flexible; with removal of tumor(s),                         polyp(s), or other lesion(s)  by snare technique CPT copyright 2016 American Medical Association. All rights reserved. The codes documented in this report are preliminary and upon coder review may  be revised to meet current compliance requirements. Jonathon Bellows, MD Jonathon Bellows MD, MD 03/31/2017 11:21:02 AM This report has been signed electronically. Number of Addenda: 0 Note Initiated On: 03/31/2017 9:34 AM Scope Withdrawal Time: 0 hours 35 minutes 20 seconds  Total Procedure Duration: 1 hour 13 minutes 25 seconds       [facility]

## 2017-03-31 NOTE — Anesthesia Procedure Notes (Signed)
Date/Time: 03/31/2017 9:43 AM Performed by: Johnna Acosta Pre-anesthesia Checklist: Patient identified, Emergency Drugs available, Suction available, Patient being monitored and Timeout performed Patient Re-evaluated:Patient Re-evaluated prior to inductionOxygen Delivery Method: Nasal cannula

## 2017-03-31 NOTE — Anesthesia Postprocedure Evaluation (Signed)
Anesthesia Post Note  Patient: Matthew Norton  Procedure(s) Performed: Procedure(s) (LRB): COLONOSCOPY WITH PROPOFOL (N/A)  Patient location during evaluation: Endoscopy Anesthesia Type: General Level of consciousness: awake and alert and oriented Pain management: pain level controlled Vital Signs Assessment: post-procedure vital signs reviewed and stable Respiratory status: spontaneous breathing, nonlabored ventilation and respiratory function stable Cardiovascular status: blood pressure returned to baseline and stable Postop Assessment: no signs of nausea or vomiting Anesthetic complications: no     Last Vitals:  Vitals:   03/31/17 1127 03/31/17 1137  BP: 120/68 130/69  Pulse: 61 60  Resp: 10 20  Temp:      Last Pain:  Vitals:   03/31/17 1107  TempSrc: Tympanic                 Ernie Kasler

## 2017-04-01 LAB — SURGICAL PATHOLOGY

## 2017-04-04 ENCOUNTER — Encounter: Payer: Self-pay | Admitting: Gastroenterology

## 2017-04-05 ENCOUNTER — Telehealth: Payer: Self-pay

## 2017-04-05 ENCOUNTER — Telehealth: Payer: Self-pay | Admitting: *Deleted

## 2017-04-05 NOTE — Telephone Encounter (Signed)
Needs OV, bring in all meds/bottles and we'll go from there.   Thanks.

## 2017-04-05 NOTE — Telephone Encounter (Signed)
Patient advised. Appointment scheduled.  

## 2017-04-05 NOTE — Telephone Encounter (Signed)
-----   Message from Jonathon Bellows, MD sent at 04/01/2017 10:15 AM EDT ----- Inform all adenomas - he had a few more polyps which we didn't take out which he will need to return to have taken out- schedule colonoscopy in 2-4 months

## 2017-04-05 NOTE — Telephone Encounter (Signed)
Pt states he was recently prescribed hydroxyzine, and it is ineffective. He states "it doesn't do any good,"  he "cant focus," and it is giving him nightmares. Pt is wondering if he is needing to go back on the valium. pls advise

## 2017-04-05 NOTE — Telephone Encounter (Signed)
Advised patient of results per Dr. Vicente Males.   Inform all adenomas - he had a few more polyps which we didn't take out which he will need to return to have taken out- schedule colonoscopy in 2-4 months   Pt requesting reminder letter to schedule follow-up colonoscopy.

## 2017-04-07 ENCOUNTER — Ambulatory Visit (INDEPENDENT_AMBULATORY_CARE_PROVIDER_SITE_OTHER): Payer: Medicare HMO | Admitting: Family Medicine

## 2017-04-07 ENCOUNTER — Encounter: Payer: Self-pay | Admitting: Family Medicine

## 2017-04-07 VITALS — BP 110/60 | HR 81 | Temp 98.9°F | Wt 197.5 lb

## 2017-04-07 DIAGNOSIS — Z1211 Encounter for screening for malignant neoplasm of colon: Secondary | ICD-10-CM

## 2017-04-07 DIAGNOSIS — F419 Anxiety disorder, unspecified: Secondary | ICD-10-CM

## 2017-04-07 DIAGNOSIS — F329 Major depressive disorder, single episode, unspecified: Secondary | ICD-10-CM

## 2017-04-07 MED ORDER — DIAZEPAM 5 MG PO TABS: 2.5000 mg | ORAL_TABLET | Freq: Two times a day (BID) | ORAL | Status: DC | PRN

## 2017-04-07 MED ORDER — FLUTICASONE PROPIONATE 50 MCG/ACT NA SUSP: 2.0000 | Freq: Every day | NASAL | 6 refills | Status: DC

## 2017-04-07 MED ORDER — SERTRALINE HCL 100 MG PO TABS: 150.0000 mg | ORAL_TABLET | Freq: Every day | ORAL | Status: DC

## 2017-04-07 NOTE — Patient Instructions (Addendum)
Increase sertraline to 1.5 tabs in a day.  Change back to diazepam, take a half to begin with.  Stop hydroxyzine since it didn't help.  Update me in a few days.  Add on flonase for the sinus congestion.  Take care.  Glad to see you.

## 2017-04-07 NOTE — Progress Notes (Signed)
He is going to have another colonoscopy in a few months.  He had some tubular adenomas. D/w pt.    Mood.  "I'm in a bad spot."  He is anxious, irritable, moody, not patient.  On SSRI.  Hydroxyzine isn't helping.  He had tried BZD prev.  No SI/HI.  Abnormal dreams on hydroxyzine.  He feels tensed up at baseline.  He is fatigued.    He has had a lot of sinus congestion and discussed with him about restart Flonase.  Meds, vitals, and allergies reviewed.   ROS: Per HPI unless specifically indicated in ROS section   GEN: nad, alert and oriented HEENT: mucous membranes moist, nasal congestion noted. Oropharynx without acute change. NECK: supple w/o LA CV: rrr.  PULM: ctab, no inc wob ABD: soft, +bs EXT: no edema SKIN: no acute rash

## 2017-04-08 NOTE — Assessment & Plan Note (Signed)
He had multiple polyps and will have another colonoscopy in a few months, discussed with patient.

## 2017-04-08 NOTE — Assessment & Plan Note (Signed)
Okay for outpatient follow-up. No suicidal or homicidal intent. Hydroxyzine is not helping, stop that medication. Increase sertraline back to 1.5 tabs a day. Restart diazepam, 1/2-1 tab twice a day as needed with sedation caution. This is likely the best option at this point going forward. He agrees. Update me as needed. >25 minutes spent in face to face time with patient, >50% spent in counselling or coordination of care.

## 2017-04-18 ENCOUNTER — Other Ambulatory Visit: Payer: Self-pay

## 2017-04-18 ENCOUNTER — Telehealth: Payer: Self-pay | Admitting: Gastroenterology

## 2017-04-18 DIAGNOSIS — D369 Benign neoplasm, unspecified site: Secondary | ICD-10-CM

## 2017-04-18 NOTE — Telephone Encounter (Signed)
Patient wants to schedule his colonoscopy for September.

## 2017-04-18 NOTE — Telephone Encounter (Signed)
Pt has been scheduled for repeat colonoscopy for adenoma polyps on September 10th, with Dr.Anna. At [facility].

## 2017-04-26 ENCOUNTER — Other Ambulatory Visit: Payer: Self-pay | Admitting: Family Medicine

## 2017-05-05 ENCOUNTER — Telehealth: Payer: Self-pay

## 2017-05-05 ENCOUNTER — Other Ambulatory Visit: Payer: Self-pay

## 2017-05-05 DIAGNOSIS — Z1211 Encounter for screening for malignant neoplasm of colon: Secondary | ICD-10-CM

## 2017-05-05 MED ORDER — PEG 3350-KCL-NA BICARB-NACL 420 G PO SOLR: 4000.0000 mL | Freq: Once | ORAL | 0 refills | Status: AC

## 2017-05-05 NOTE — Telephone Encounter (Signed)
Contacted pt to reschedule appt 06/20/17 because it was on a Monday.  He stated that he did not have transportation any ways on that date and asked to be moved to October.  His Colonoscopy has been rescheduled to 07/12/17 (Tues) with Dr. Vicente Males at [facility]  I will place new instructions in mail for him, noting use of "Nulytely" for his bowel prep because he did not like the Su Prep.

## 2017-05-12 ENCOUNTER — Other Ambulatory Visit: Payer: Self-pay | Admitting: Family Medicine

## 2017-05-12 NOTE — Telephone Encounter (Signed)
Sent. Thanks.   

## 2017-05-12 NOTE — Telephone Encounter (Signed)
Last OV note states pt is to increase zoloft to 1.5tabs daily. pls advise

## 2017-06-17 ENCOUNTER — Other Ambulatory Visit: Payer: Self-pay | Admitting: Family Medicine

## 2017-06-17 ENCOUNTER — Ambulatory Visit (INDEPENDENT_AMBULATORY_CARE_PROVIDER_SITE_OTHER): Payer: Medicare Other

## 2017-06-17 VITALS — BP 124/70 | HR 57 | Temp 98.0°F | Ht 68.0 in | Wt 191.0 lb

## 2017-06-17 DIAGNOSIS — Z23 Encounter for immunization: Secondary | ICD-10-CM | POA: Diagnosis not present

## 2017-06-17 DIAGNOSIS — Z125 Encounter for screening for malignant neoplasm of prostate: Secondary | ICD-10-CM

## 2017-06-17 DIAGNOSIS — I10 Essential (primary) hypertension: Secondary | ICD-10-CM

## 2017-06-17 DIAGNOSIS — Z0000 Encounter for general adult medical examination without abnormal findings: Secondary | ICD-10-CM | POA: Diagnosis not present

## 2017-06-17 LAB — COMPREHENSIVE METABOLIC PANEL
ALBUMIN: 4.6 g/dL (ref 3.5–5.2)
ALK PHOS: 72 U/L (ref 39–117)
ALT: 12 U/L (ref 0–53)
AST: 14 U/L (ref 0–37)
BILIRUBIN TOTAL: 0.5 mg/dL (ref 0.2–1.2)
BUN: 14 mg/dL (ref 6–23)
CO2: 27 mEq/L (ref 19–32)
Calcium: 10.2 mg/dL (ref 8.4–10.5)
Chloride: 101 mEq/L (ref 96–112)
Creatinine, Ser: 1.06 mg/dL (ref 0.40–1.50)
GFR: 88.88 mL/min (ref >60.00)
GLUCOSE: 79 mg/dL (ref 70–99)
POTASSIUM: 4.7 meq/L (ref 3.5–5.1)
Sodium: 137 mEq/L (ref 135–145)
TOTAL PROTEIN: 7.9 g/dL (ref 6.0–8.3)

## 2017-06-17 LAB — LIPID PANEL
CHOLESTEROL: 268 mg/dL — AB (ref 0–200)
HDL: 33.1 mg/dL — AB (ref >39.00)
LDL Cholesterol: 203 mg/dL — ABNORMAL HIGH (ref 0–99)
NONHDL: 234.79
Total CHOL/HDL Ratio: 8
Triglycerides: 159 mg/dL — ABNORMAL HIGH (ref 0.0–149.0)
VLDL: 31.8 mg/dL (ref 0.0–40.0)

## 2017-06-17 LAB — PSA, MEDICARE: PSA: 0.37 ng/mL (ref 0.10–4.00)

## 2017-06-17 NOTE — Progress Notes (Signed)
PCP notes:   Health maintenance:  Flu vaccine - administered PNA vaccine - administered  Abnormal screenings:   Hearing - failed Mini-Cog score: 19/20 Depression score: 11 Fall risk - hx of multiple falls without injury  Patient concerns:   A. Flonase is not effective for reducing nasal drainage B. Prescribed dosage of Zoloft is causing nightmares.  C. Urinary incontinence - patient states bladder is not emptying completely and there is often urine in underwear  D. Decreased appetite due to loss of teeth - has led to unintentional weight loss E. Generalized fatigue - patient states he feels tired all the time  Nurse concerns:  None  Next PCP appt:   06/21/17 @ 1215  I reviewed health advisor's note, was available for consultation on the day of service listed in this note, and agree with documentation and plan. Elsie Stain, MD.

## 2017-06-17 NOTE — Progress Notes (Signed)
Pre visit review using our clinic review tool, if applicable. No additional management support is needed unless otherwise documented below in the visit note. 

## 2017-06-17 NOTE — Progress Notes (Signed)
Subjective:   Matthew Norton is a 71 y.o. male who presents for Medicare Annual/Subsequent preventive examination.  Review of Systems:  N/A Cardiac Risk Factors include: advanced age (>56men, >31 women);male gender;hypertension;dyslipidemia;smoking/ tobacco exposure     Objective:    Vitals: BP 124/70 (BP Location: Right Arm, Patient Position: Sitting, Cuff Size: Normal)   Pulse (!) 57   Temp 98 F (36.7 C) (Oral)   Ht 5\' 8"  (1.727 m) Comment: shoes  Wt 191 lb (86.6 kg)   SpO2 97%   BMI 29.04 kg/m   Body mass index is 29.04 kg/m.  Tobacco History  Smoking Status  . Current Every Day Smoker  . Packs/day: 1.00  . Years: 50.00  Smokeless Tobacco  . Never Used     Ready to quit: No Counseling given: No   Past Medical History:  Diagnosis Date  . Anxiety    takes Valium daily as needed  . Arthritis   . Cataracts, bilateral   . Complication of anesthesia    slow to wake up after one surgery  . Coronary artery disease   . Depression   . Hyperlipemia    has tried meds but not on any bc of joint aches and pains  . Hypertension    takes Metoprolol and Losartan daily  . Insomnia   . Joint pain   . Peripheral edema   . Shortness of breath    with exertion   . Weakness    numbness in both hands and feet.   Past Surgical History:  Procedure Laterality Date  . ANTERIOR CERVICAL DECOMP/DISCECTOMY FUSION N/A 03/24/2015   Procedure: Cervical four- five, anterior cervical decompression with fusion, plating and bonegraft;  Surgeon: Jovita Gamma, MD;  Location: [place] [facility]URO OR[facility];  Service: Neurosurgery;  Laterality: N/A;  C45 anterior cervical decompression with fusion plating and bonegraft  . BACK SURGERY  2001   lumb lam  . CARDIAC CATHETERIZATION  08,11   stents x2  . [place]   fx rt   . COLONOSCOPY WITH PROPOFOL N/A 03/31/2017   Procedure: COLONOSCOPY WITH PROPOFOL;  Surgeon: Jonathon Bellows, MD;  Location: [facility] ENDOSCOPY;  Service: Endoscopy;  Laterality:  N/A;  . CORONARY ANGIOPLASTY     2 stents  . ESOPHAGOGASTRODUODENOSCOPY    . HERNIA REPAIR  2007   rt and lt ing   . NAILBED REPAIR Left 09/13/2013   Procedure: LEFT THUMB NAIL REMOVAL, NAIL BED RECONSTRUCTION PARTIAL EXCISION DISTAL PHALANX WITH COMPLEX RECONSTRUCTION ;  Surgeon: Roseanne Kaufman, MD;  Location: [facility];  Service: Orthopedics;  Laterality: Left;  . parotid mass removed    . REPLANTATION THUMB  2003   left   Family History  Problem Relation Age of Onset  . Hypertension Mother   . Cancer Mother   . Cancer Father   . Colon cancer Father   . Prostate cancer Father    History  Sexual Activity  . Sexual activity: No    Outpatient Encounter Prescriptions as of 06/17/2017  Medication Sig  . acetaminophen (TYLENOL) 500 MG tablet Take 2 tablets (1,000 mg total) by mouth every 8 (eight) hours as needed (for pain).  Marland Kitchen aspirin 81 MG tablet Take 81 mg by mouth daily.  . cetirizine (ZYRTEC) 10 MG tablet Take by mouth.  . diazepam (VALIUM) 5 MG tablet Take 0.5-1 tablets (2.5-5 mg total) by mouth every 12 (twelve) hours as needed for anxiety (sedation caution).  . fluticasone (FLONASE) 50 MCG/ACT nasal  spray Place 2 sprays into both nostrils daily.  Marland Kitchen losartan (COZAAR) 25 MG tablet TAKE 1 TABLET BY MOUTH ONCE DAILY  . [START ON 07/11/2017] polyethylene glycol-electrolytes (NULYTELY/GOLYTELY) 420 g solution Take 4,000 mLs by mouth once.  . nitroGLYCERIN (NITROSTAT) 0.4 MG SL tablet Place under the tongue every 5 (five) minutes as needed for chest pain. Reported on 04/29/2016  . sertraline (ZOLOFT) 100 MG tablet Take 1.5 tablets (150 mg total) by mouth daily. (Patient not taking: Reported on 06/17/2017)  . sertraline (ZOLOFT) 100 MG tablet Take 1.5 tablets (150 mg total) by mouth daily. (Patient not taking: Reported on 06/17/2017)   No facility-administered encounter medications on file as of 06/17/2017.     Activities of Daily Living In your present state of health, do  you have any difficulty performing the following activities: 06/17/2017  Hearing? N  Vision? Y  Difficulty concentrating or making decisions? N  Walking or climbing stairs? Y  Dressing or bathing? N  Doing errands, shopping? N  Preparing Food and eating ? N  Using the Toilet? N  In the past six months, have you accidently leaked urine? Y  Do you have problems with loss of bowel control? N  Managing your Medications? N  Managing your Finances? N  Housekeeping or managing your Housekeeping? N  Some recent data might be hidden    Patient Care Team: Tonia Ghent, MD as PCP - General (Family Medicine) Lupita Raider, DO as Referring Physician (Optometry)   Assessment:     Hearing Screening   125Hz  250Hz  500Hz  1000Hz  2000Hz  3000Hz  4000Hz  6000Hz  8000Hz   Right ear:   40 40 40  0    Left ear:   40 40 0  0      Visual Acuity Screening   Right eye Left eye Both eyes  Without correction:     With correction: 20/40 20/40 20/40     Exercise Activities and Dietary recommendations Current Exercise Habits: The patient does not participate in regular exercise at present, Exercise limited by: None identified  Goals    . Increase water intake          Starting 06/17/2017, I will attempt to drink at least 6-8 glasses of water daily.       Fall Risk Fall Risk  06/17/2017 04/29/2016 04/29/2016  Falls in the past year? Yes No No  Comment 7-8 falls over the previous 12 mths due to back or knees "giving away" - -  Number falls in past yr: 2 or more - -  Injury with Fall? Yes - -  Risk Factor Category  High Fall Risk - -  Risk for fall due to : Impaired balance/gait;Impaired mobility - -   Depression Screen PHQ 2/9 Scores 06/17/2017 04/29/2016 04/29/2016  PHQ - 2 Score 4 0 0  PHQ- 9 Score 11 - -    Cognitive Function MMSE - Mini Mental State Exam 06/17/2017  Orientation to time 5  Orientation to Place 5  Registration 3  Attention/ Calculation 0  Recall 2  Recall-comments pt was unable to  recall 1 of 3 words  Language- name 2 objects 0  Language- repeat 1  Language- follow 3 step command 3  Language- read & follow direction 0  Write a sentence 0  Copy design 0  Total score 19     PLEASE NOTE: A Mini-Cog screen was completed. Maximum score is 20. A value of 0 denotes this part of [place] MMSE was not completed or the  patient failed this part of the Mini-Cog screening.   Mini-Cog Screening Orientation to Time - Max 5 pts Orientation to Place - Max 5 pts Registration - Max 3 pts Recall - Max 3 pts Language Repeat - Max 1 pts Language Follow 3 Step Command - Max 3 pts     Immunization History  Administered Date(s) Administered  . Influenza Split 07/18/2014  . Influenza,inj,Quad PF,6+ Mos 06/17/2017  . Pneumococcal Conjugate-13 04/29/2016  . Pneumococcal Polysaccharide-23 06/17/2017  . Zoster 08/09/2013   Screening Tests Health Maintenance  Topic Date Due  . TETANUS/TDAP  10/11/2020  . COLONOSCOPY  04/01/2027  . INFLUENZA VACCINE  Completed  . Hepatitis C Screening  Completed  . PNA vac Low Risk Adult  Completed      Plan:     I have personally reviewed and addressed the Medicare Annual Wellness questionnaire and have noted the following in the patient's chart:  A. Medical and social history B. Use of alcohol, tobacco or illicit drugs  C. Current medications and supplements D. Functional ability and status E.  Nutritional status F.  Physical activity G. Advance directives H. List of other physicians I.  Hospitalizations, surgeries, and ER visits in previous 12 months J.  [place] to include hearing, vision, cognitive, depression L. Referrals and appointments - none  In addition, I have reviewed and discussed with patient certain preventive protocols, quality metrics, and best practice recommendations. A written personalized care plan for preventive services as well as general preventive health recommendations were provided to  patient.  See attached scanned questionnaire for additional information.   Signed,   Lindell Noe, MHA, BS, LPN Health Coach

## 2017-06-17 NOTE — Patient Instructions (Signed)
Matthew Norton , Thank you for taking time to come for your Medicare Wellness Visit. I appreciate your ongoing commitment to your health goals. Please review the following plan we discussed and let me know if I can assist you in the future.   These are the goals we discussed: Goals    . Increase water intake          Starting 06/17/2017, I will attempt to drink at least 6-8 glasses of water daily.        This is a list of the screening recommended for you and due dates:  Health Maintenance  Topic Date Due  . Tetanus Vaccine  10/11/2020  . Colon Cancer Screening  04/01/2027  . Flu Shot  Completed  .  Hepatitis C: One time screening is recommended by Center for Disease Control  (CDC) for  adults born from [birth year] through 1964.   Completed  . Pneumonia vaccines  Completed   Preventive Care for Adults  A healthy lifestyle and preventive care can promote health and wellness. Preventive health guidelines for adults include the following key practices.  . A routine yearly physical is a good way to check with your health care provider about your health and preventive screening. It is a chance to share any concerns and updates on your health and to receive a thorough exam.  . Visit your dentist for a routine exam and preventive care every 6 months. Brush your teeth twice a day and floss once a day. Good oral hygiene prevents tooth decay and gum disease.  . The frequency of eye exams is based on your age, health, family medical history, use  of contact lenses, and other factors. Follow your health care provider's ecommendations for frequency of eye exams.  . Eat a healthy diet. Foods like vegetables, fruits, whole grains, low-fat dairy products, and lean protein foods contain the nutrients you need without too many calories. Decrease your intake of foods high in solid fats, added sugars, and salt. Eat the right amount of calories for you. Get information about a proper diet from your health care  provider, if necessary.  . Regular physical exercise is one of the most important things you can do for your health. Most adults should get at least 150 minutes of moderate-intensity exercise (any activity that increases your heart rate and causes you to sweat) each week. In addition, most adults need muscle-strengthening exercises on 2 or more days a week.  Silver Sneakers may be a benefit available to you. To determine eligibility, you may visit the website: www.silversneakers.com or contact program at (412)416-8940 Mon-Fri between 8AM-8PM.   . Maintain a healthy weight. The body mass index (BMI) is a screening tool to identify possible weight problems. It provides an estimate of body fat based on height and weight. Your health care provider can find your BMI and can help you achieve or maintain a healthy weight.   For adults 20 years and older: ? A BMI below 18.5 is considered underweight. ? A BMI of 18.5 to 24.9 is normal. ? A BMI of 25 to 29.9 is considered overweight. ? A BMI of 30 and above is considered obese.   . Maintain normal blood lipids and cholesterol levels by exercising and minimizing your intake of saturated fat. Eat a balanced diet with plenty of fruit and vegetables. Blood tests for lipids and cholesterol should begin at age 49 and be repeated every 5 years. If your lipid or cholesterol levels are high, you  are over 26, or you are at high risk for heart disease, you may need your cholesterol levels checked more frequently. Ongoing high lipid and cholesterol levels should be treated with medicines if diet and exercise are not working.  . If you smoke, find out from your health care provider how to quit. If you do not use tobacco, please do not start.  . If you choose to drink alcohol, please do not consume more than 2 drinks per day. One drink is considered to be 12 ounces (355 mL) of beer, 5 ounces (148 mL) of wine, or 1.5 ounces (44 mL) of liquor.  . If you are 79-79 years  old, ask your health care provider if you should take aspirin to prevent strokes.  . Use sunscreen. Apply sunscreen liberally and repeatedly throughout the day. You should seek shade when your shadow is shorter than you. Protect yourself by wearing long sleeves, pants, a wide-brimmed hat, and sunglasses year round, whenever you are outdoors.  . Once a month, do a whole body skin exam, using a mirror to look at the skin on your back. Tell your health care provider of new moles, moles that have irregular borders, moles that are larger than a pencil eraser, or moles that have changed in shape or color.

## 2017-06-21 ENCOUNTER — Ambulatory Visit (INDEPENDENT_AMBULATORY_CARE_PROVIDER_SITE_OTHER): Payer: Medicare Other | Admitting: Family Medicine

## 2017-06-21 ENCOUNTER — Telehealth: Payer: Self-pay | Admitting: *Deleted

## 2017-06-21 ENCOUNTER — Encounter: Payer: Medicare HMO | Admitting: Family Medicine

## 2017-06-21 ENCOUNTER — Encounter: Payer: Self-pay | Admitting: Family Medicine

## 2017-06-21 VITALS — BP 124/70 | HR 57 | Temp 98.0°F | Ht 68.0 in | Wt 191.0 lb

## 2017-06-21 DIAGNOSIS — I10 Essential (primary) hypertension: Secondary | ICD-10-CM | POA: Diagnosis not present

## 2017-06-21 DIAGNOSIS — J309 Allergic rhinitis, unspecified: Secondary | ICD-10-CM | POA: Diagnosis not present

## 2017-06-21 DIAGNOSIS — F419 Anxiety disorder, unspecified: Secondary | ICD-10-CM

## 2017-06-21 DIAGNOSIS — R399 Unspecified symptoms and signs involving the genitourinary system: Secondary | ICD-10-CM

## 2017-06-21 DIAGNOSIS — F329 Major depressive disorder, single episode, unspecified: Secondary | ICD-10-CM

## 2017-06-21 DIAGNOSIS — E785 Hyperlipidemia, unspecified: Secondary | ICD-10-CM | POA: Diagnosis not present

## 2017-06-21 DIAGNOSIS — Z7189 Other specified counseling: Secondary | ICD-10-CM

## 2017-06-21 MED ORDER — SERTRALINE HCL 100 MG PO TABS: 100.0000 mg | ORAL_TABLET | Freq: Every day | ORAL | Status: DC

## 2017-06-21 MED ORDER — TAMSULOSIN HCL 0.4 MG PO CAPS: 0.4000 mg | ORAL_CAPSULE | Freq: Every day | ORAL | 3 refills | Status: DC

## 2017-06-21 MED ORDER — DIAZEPAM 5 MG PO TABS: 2.5000 mg | ORAL_TABLET | Freq: Two times a day (BID) | ORAL | 1 refills | Status: DC | PRN

## 2017-06-21 NOTE — Progress Notes (Signed)
Hearing - failed Mini-Cog score: 19/20 Depression score: 11. 150mg  Zoloft was causing nightmares. His mood was slightly better on the higher dose, at 150mg , but his nightmares were worse at 150mg . He is still fatigued.  No SI/HI.    Fall risk - hx of multiple falls without injury.  Walking with cane with some relief.  D/w pt about cautions.    Has been taking flonase and zyrtec but still having runny nose, esp in the AM.    Urinary incontinence - patient states bladder is not emptying completely and there is often urine in underwear.  PSA wnl.  dw pt.  Some nocturia and that affects his sleep/energy level.  LUTS with slower stream noted.    Decreased appetite due to loss of teeth - has led to unintentional weight loss.  He doesn't have dentures and "by the time I have to chew it I don't want it."  D/w pt about dental f/u when possible.    Lipids up, statin intolerant.  D/w pt.  D/w pt about cutting back on ice cream.  Labs d/w pt.    He has f/u colonoscopy pending.    Hypertension:    Using medication without problems or lightheadedness: yes Chest pain with exertion:no Edema:no Short of breath: only with walking distances, stable, not worse.    If patient were incapacitated, then would have [employer].   Meds, vitals, and allergies reviewed.   PMH and SH reviewed  ROS: Per HPI unless specifically indicated in ROS section   GEN: nad, alert and oriented HEENT: mucous membranes moist, edentulous, OP wnl o/w.   NECK: supple w/o LA CV: rrr. PULM: ctab, no inc wob ABD: soft, +bs EXT: no edema SKIN: no acute rash Walking with cane at baseline. Speech and affect wnl.

## 2017-06-21 NOTE — Patient Instructions (Signed)
Keep taking sertraline 100mg  a day.  Try the flonase spray at night.  Add on flomax daily and see if that helps with urination (and see if you don't have to get up as much at night).   Take the valium only as needed.  We'll call in a refill.   If your aren't sleeping better and if your mood isn't better, then let us know.  Take care.  Glad to see you.

## 2017-06-21 NOTE — Telephone Encounter (Signed)
-----   Message from Tonia Ghent, MD sent at 06/21/2017  1:08 PM EDT ----- Please call in a refill on the valium.  See EMR.  Thanks.

## 2017-06-21 NOTE — Telephone Encounter (Signed)
Medication phoned to pharmacy.  

## 2017-06-23 NOTE — Assessment & Plan Note (Signed)
Would try flonase at night and see if that helps with AM sx.  D/w pt.

## 2017-06-23 NOTE — Assessment & Plan Note (Signed)
If patient were incapacitated, then would have [employer].

## 2017-06-23 NOTE — Assessment & Plan Note (Signed)
Will start flomax.  Hopefully that will help with LUTS and sleep and therefore his energy level.  Unclear how much his mood is affected by sleep disruption with nocturia.  D/w pt.  He agrees.  We only wanted to start one med at a time.

## 2017-06-23 NOTE — Assessment & Plan Note (Addendum)
Will continue sertraline 100mg  a day for now and we'll likely need to readdress if his mood isn't better with better sleep.  He agrees.  No SI/HI, okay for outpatient f/u.   >40 minutes spent in face to face time with patient, >50% spent in counselling or coordination of care re: the listed dx today.

## 2017-06-23 NOTE — Assessment & Plan Note (Signed)
Lipids up, statin intolerant.  D/w pt.  D/w pt about cutting back on ice cream.  Labs d/w pt.

## 2017-06-23 NOTE — Assessment & Plan Note (Signed)
Reasonable control, no edema, would continue as is and advised patient to walk carefully as tolerated for exercise with routine cautions.  He agrees.

## 2017-07-08 ENCOUNTER — Telehealth: Payer: Self-pay | Admitting: Gastroenterology

## 2017-07-08 NOTE — Telephone Encounter (Signed)
Matthew Norton needs to r/s [name]'s procedure on 07/12/17 due to transportation problem, please call her back.

## 2017-07-08 NOTE — Telephone Encounter (Signed)
Colonoscopy moved to Oct 9th with Dr Allen Norris at [facility] due to transportation issues.  Matthew Norton has been asked to change date from 10/02 to 07/19/17.  Thanks [employer]

## 2017-07-18 ENCOUNTER — Telehealth: Payer: Self-pay | Admitting: Gastroenterology

## 2017-07-18 NOTE — Telephone Encounter (Signed)
Please call Matthew Norton as [name] has gout and is unable to walk. They would like to r/s for next month.

## 2017-08-19 ENCOUNTER — Telehealth: Payer: Self-pay | Admitting: Gastroenterology

## 2017-08-19 NOTE — Telephone Encounter (Signed)
Please call patient as he is not sure what medications to stop prior to his procedure.

## 2017-08-19 NOTE — Telephone Encounter (Signed)
Patient called concerning what meds to stop prior to the procedure.   Advised patient that he could take his regular meds until midnight the night prior. Daily medications should be taken following the procedure.   Patient asked if he could have 2-3 sips of coffee prior to the procedure. Advised patient no food or drink for 5 hours prior to the procedure due to possible aspiration.

## 2017-08-20 DIAGNOSIS — H524 Presbyopia: Secondary | ICD-10-CM | POA: Diagnosis not present

## 2017-08-23 ENCOUNTER — Telehealth: Payer: Self-pay | Admitting: Gastroenterology

## 2017-08-23 DIAGNOSIS — E538 Deficiency of other specified B group vitamins: Secondary | ICD-10-CM | POA: Insufficient documentation

## 2017-08-23 DIAGNOSIS — F17200 Nicotine dependence, unspecified, uncomplicated: Secondary | ICD-10-CM | POA: Insufficient documentation

## 2017-08-23 DIAGNOSIS — F32A Depression, unspecified: Secondary | ICD-10-CM | POA: Insufficient documentation

## 2017-08-23 DIAGNOSIS — F329 Major depressive disorder, single episode, unspecified: Secondary | ICD-10-CM | POA: Insufficient documentation

## 2017-08-23 DIAGNOSIS — M2550 Pain in unspecified joint: Secondary | ICD-10-CM | POA: Insufficient documentation

## 2017-08-23 NOTE — Telephone Encounter (Signed)
Please call [name] regarding Mr. Shin appt. She stated Mr. Minion doesn't know anything about this appt and she needs for you to call her and mail all the information to her so she can help him get ready for the procedure.  [phone] call before 5 today please

## 2017-08-23 NOTE — Telephone Encounter (Signed)
Responded to phone msg: Please call Matthew Norton regarding Matthew Norton appt. She stated Matthew Norton doesn't know anything about this appt and she needs for you to call her and mail all the information to her so she can help him get ready for the procedure.  [phone] call before 5 today please   Advised Matthew Norton of entire prep process. She wrote the information down.   I asked Matthew Norton if Matthew Norton had memory issues that we were unaware of due to the fact that on Friday I had advised him of the entire prep process and when to stop and start medications as well. She said she didn't know but she would take all the information because he told her he didn't know anything and had lost his papers.

## 2017-08-25 ENCOUNTER — Encounter: Payer: Self-pay | Admitting: Anesthesiology

## 2017-08-25 ENCOUNTER — Ambulatory Visit: Payer: Medicare Other | Admitting: Registered Nurse

## 2017-08-25 ENCOUNTER — Encounter
Admission: RE | Disposition: A | Discharge status: Home / Self Care | Payer: Self-pay | Source: Ambulatory Visit | Attending: Gastroenterology

## 2017-08-25 ENCOUNTER — Ambulatory Visit
Admission: RE | Admit: 2017-08-25 | Discharge: 2017-08-25 | Disposition: A | Discharge status: Home / Self Care | Payer: Medicare Other | Source: Ambulatory Visit | Attending: Gastroenterology | Admitting: Gastroenterology

## 2017-08-25 DIAGNOSIS — Z981 Arthrodesis status: Secondary | ICD-10-CM | POA: Insufficient documentation

## 2017-08-25 DIAGNOSIS — D125 Benign neoplasm of sigmoid colon: Secondary | ICD-10-CM | POA: Diagnosis not present

## 2017-08-25 DIAGNOSIS — F17200 Nicotine dependence, unspecified, uncomplicated: Secondary | ICD-10-CM | POA: Insufficient documentation

## 2017-08-25 DIAGNOSIS — Z7982 Long term (current) use of aspirin: Secondary | ICD-10-CM | POA: Insufficient documentation

## 2017-08-25 DIAGNOSIS — R0602 Shortness of breath: Secondary | ICD-10-CM | POA: Diagnosis not present

## 2017-08-25 DIAGNOSIS — F419 Anxiety disorder, unspecified: Secondary | ICD-10-CM | POA: Diagnosis not present

## 2017-08-25 DIAGNOSIS — E785 Hyperlipidemia, unspecified: Secondary | ICD-10-CM | POA: Diagnosis not present

## 2017-08-25 DIAGNOSIS — Z888 Allergy status to other drugs, medicaments and biological substances status: Secondary | ICD-10-CM | POA: Insufficient documentation

## 2017-08-25 DIAGNOSIS — D124 Benign neoplasm of descending colon: Secondary | ICD-10-CM | POA: Diagnosis not present

## 2017-08-25 DIAGNOSIS — F329 Major depressive disorder, single episode, unspecified: Secondary | ICD-10-CM | POA: Diagnosis not present

## 2017-08-25 DIAGNOSIS — D120 Benign neoplasm of cecum: Secondary | ICD-10-CM | POA: Diagnosis not present

## 2017-08-25 DIAGNOSIS — R609 Edema, unspecified: Secondary | ICD-10-CM | POA: Diagnosis not present

## 2017-08-25 DIAGNOSIS — D369 Benign neoplasm, unspecified site: Secondary | ICD-10-CM

## 2017-08-25 DIAGNOSIS — I10 Essential (primary) hypertension: Secondary | ICD-10-CM | POA: Insufficient documentation

## 2017-08-25 DIAGNOSIS — K5730 Diverticulosis of large intestine without perforation or abscess without bleeding: Secondary | ICD-10-CM | POA: Insufficient documentation

## 2017-08-25 DIAGNOSIS — Z79899 Other long term (current) drug therapy: Secondary | ICD-10-CM | POA: Diagnosis not present

## 2017-08-25 DIAGNOSIS — D122 Benign neoplasm of ascending colon: Secondary | ICD-10-CM | POA: Diagnosis not present

## 2017-08-25 DIAGNOSIS — M1990 Unspecified osteoarthritis, unspecified site: Secondary | ICD-10-CM | POA: Insufficient documentation

## 2017-08-25 DIAGNOSIS — I252 Old myocardial infarction: Secondary | ICD-10-CM | POA: Diagnosis not present

## 2017-08-25 DIAGNOSIS — I739 Peripheral vascular disease, unspecified: Secondary | ICD-10-CM | POA: Diagnosis not present

## 2017-08-25 DIAGNOSIS — G4700 Insomnia, unspecified: Secondary | ICD-10-CM | POA: Insufficient documentation

## 2017-08-25 DIAGNOSIS — K640 First degree hemorrhoids: Secondary | ICD-10-CM | POA: Insufficient documentation

## 2017-08-25 DIAGNOSIS — Z1211 Encounter for screening for malignant neoplasm of colon: Secondary | ICD-10-CM | POA: Insufficient documentation

## 2017-08-25 DIAGNOSIS — I2510 Atherosclerotic heart disease of native coronary artery without angina pectoris: Secondary | ICD-10-CM | POA: Diagnosis not present

## 2017-08-25 DIAGNOSIS — Z86010 Personal history of colonic polyps: Secondary | ICD-10-CM | POA: Diagnosis not present

## 2017-08-25 DIAGNOSIS — K635 Polyp of colon: Secondary | ICD-10-CM | POA: Diagnosis not present

## 2017-08-25 DIAGNOSIS — R531 Weakness: Secondary | ICD-10-CM | POA: Insufficient documentation

## 2017-08-25 HISTORY — PX: COLONOSCOPY WITH PROPOFOL: SHX5780

## 2017-08-25 SURGERY — COLONOSCOPY WITH PROPOFOL
Anesthesia: General

## 2017-08-25 MED ORDER — PROPOFOL 10 MG/ML IV BOLUS
INTRAVENOUS | Status: DC | PRN
  Administered 2017-08-25 (×7): 10 mg via INTRAVENOUS

## 2017-08-25 MED ORDER — GLYCOPYRROLATE 0.2 MG/ML IJ SOLN
INTRAMUSCULAR | Status: DC | PRN
  Administered 2017-08-25: 0.2 mg via INTRAVENOUS

## 2017-08-25 MED ORDER — SODIUM CHLORIDE 0.9 % IJ SOLN
INTRAMUSCULAR | Status: DC | PRN
  Administered 2017-08-25: 7 mL

## 2017-08-25 MED ORDER — PROPOFOL 500 MG/50ML IV EMUL
INTRAVENOUS | Status: DC | PRN
  Administered 2017-08-25: 50 ug/kg/min via INTRAVENOUS

## 2017-08-25 MED ORDER — SODIUM CHLORIDE 0.9 % IV SOLN
INTRAVENOUS | Status: DC
  Administered 2017-08-25: 08:00:00 via INTRAVENOUS

## 2017-08-25 NOTE — H&P (Signed)
Jonathon Bellows, MD [address] [place], [place], [place], [address] [address], [place], [place], [place], [address] Phone: [phone]  Fax: [phone]  Primary Care Physician:  Tonia Ghent, MD   Pre-Procedure History & Physical: HPI:  Matthew Norton is a 71 y.o. male is here for an colonoscopy.   Past Medical History:  Diagnosis Date  . Anxiety    takes Valium daily as needed  . Arthritis   . Cataracts, bilateral   . Complication of anesthesia    slow to wake up after one surgery  . Coronary artery disease   . Depression   . Hyperlipemia    has tried meds but not on any bc of joint aches and pains  . Hypertension    takes Metoprolol and Losartan daily  . Insomnia   . Joint pain   . Peripheral edema   . Shortness of breath    with exertion   . Weakness    numbness in both hands and feet.    Past Surgical History:  Procedure Laterality Date  . ANTERIOR CERVICAL DECOMP/DISCECTOMY FUSION N/A 03/24/2015   Procedure: Cervical four- five, anterior cervical decompression with fusion, plating and bonegraft;  Surgeon: Jovita Gamma, MD;  Location: [facility];  Service: Neurosurgery;  Laterality: N/A;  C45 anterior cervical decompression with fusion plating and bonegraft  . BACK SURGERY  2001   lumb lam  . CARDIAC CATHETERIZATION  08,11   stents x2  . [place]   fx rt   . COLONOSCOPY WITH PROPOFOL N/A 03/31/2017   Procedure: COLONOSCOPY WITH PROPOFOL;  Surgeon: Jonathon Bellows, MD;  Location: [facility] ENDOSCOPY;  Service: Endoscopy;  Laterality: N/A;  . CORONARY ANGIOPLASTY     2 stents  . ESOPHAGOGASTRODUODENOSCOPY    . HERNIA REPAIR  2007   rt and lt ing   . NAILBED REPAIR Left 09/13/2013   Procedure: LEFT THUMB NAIL REMOVAL, NAIL BED RECONSTRUCTION PARTIAL EXCISION DISTAL PHALANX WITH COMPLEX RECONSTRUCTION ;  Surgeon: Roseanne Kaufman, MD;  Location: [facility];  Service: Orthopedics;  Laterality: Left;  . parotid mass removed    .  REPLANTATION THUMB  2003   left    Prior to Admission medications   Medication Sig Start Date End Date Taking? Authorizing Provider  acetaminophen (TYLENOL) 500 MG tablet Take 2 tablets (1,000 mg total) by mouth every 8 (eight) hours as needed (for pain). 03/01/16  Yes Tonia Ghent, MD  tamsulosin (FLOMAX) 0.4 MG CAPS capsule Take 1 capsule (0.4 mg total) by mouth daily. 06/21/17  Yes Tonia Ghent, MD  aspirin 81 MG tablet Take 81 mg by mouth daily.    [provider]  cetirizine (ZYRTEC) 10 MG tablet Take by mouth.    [provider]  diazepam (VALIUM) 5 MG tablet Take 0.5-1 tablets (2.5-5 mg total) by mouth every 12 (twelve) hours as needed for anxiety (sedation caution). 06/21/17   Tonia Ghent, MD  fluticasone Asencion Islam) 50 MCG/ACT nasal spray Place 2 sprays into both nostrils daily. 04/07/17   Tonia Ghent, MD  losartan (COZAAR) 25 MG tablet TAKE 1 TABLET BY MOUTH ONCE DAILY 04/26/17   Tonia Ghent, MD  nitroGLYCERIN (NITROSTAT) 0.4 MG SL tablet Place under the tongue every 5 (five) minutes as needed for chest pain. Reported on 04/29/2016    [provider]  sertraline (ZOLOFT) 100 MG tablet Take 1 tablet (100 mg total) by mouth daily. 06/21/17   Damita Dunnings,  Elveria Rising, MD    Allergies as of 04/18/2017 - Review Complete 04/07/2017  Allergen Reaction Noted  . Bupropion Other (See Comments) 03/25/2014  . Flexeril [cyclobenzaprine] Other (See Comments) 10/25/2014  . Gabapentin Other (See Comments) 03/25/2016  . Hydrochlorothiazide Other (See Comments) 12/04/2014  . Hydroxyzine Other (See Comments) 04/07/2017  . Lipitor [atorvastatin]  04/29/2016  . Paroxetine hcl Other (See Comments) 12/04/2014  . Pravastatin Other (See Comments) 10/25/2014  . Venlafaxine Other (See Comments) 12/04/2014    Family History  Problem Relation Age of Onset  . Hypertension Mother   . Cancer Mother   . Cancer Father   . Colon cancer Father   . Prostate cancer Father      Social History   Socioeconomic History  . Marital status: Single    Spouse name: Not on file  . Number of children: Not on file  . Years of education: Not on file  . Highest education level: Not on file  Social Needs  . Financial resource strain: Not on file  . Food insecurity - worry: Not on file  . Food insecurity - inability: Not on file  . Transportation needs - medical: Not on file  . Transportation needs - non-medical: Not on file  Occupational History  . Not on file  Tobacco Use  . Smoking status: Current Every Day Smoker    Packs/day: 1.00    Years: 50.00    Pack years: 50.00  . Smokeless tobacco: Never Used  Substance and Sexual Activity  . Alcohol use: No    Alcohol/week: 0.0 oz  . Drug use: No  . Sexual activity: No  Other Topics Concern  . Not on file  Social History Narrative   2 kids   In relationship with long term girlfriend    Review of Systems: See HPI, otherwise negative ROS  Physical Exam: BP (!) 164/88   Pulse 70   Temp 97.8 F (36.6 C) (Tympanic)   Resp 16   Ht 5\' 8"  (1.727 m)   Wt 193 lb (87.5 kg)   SpO2 100%   BMI 29.35 kg/m  General:   Alert,  pleasant and cooperative in NAD Head:  Normocephalic and atraumatic. Neck:  Supple; no masses or thyromegaly. Lungs:  Clear throughout to auscultation, normal respiratory effort.    Heart:  +S1, +S2, Regular rate and rhythm, No edema. Abdomen:  Soft, nontender and nondistended. Normal bowel sounds, without guarding, and without rebound.   Neurologic:  Alert and  oriented x4;  grossly normal neurologically.  Impression/Plan: Matthew Norton is here for an colonoscopy to be performed for surveillance due to prior history of colon polyps   Risks, benefits, limitations, and alternatives regarding  colonoscopy have been reviewed with the patient.  Questions have been answered.  All parties agreeable.   Jonathon Bellows, MD  08/25/2017, 8:01 AM

## 2017-08-25 NOTE — Transfer of Care (Signed)
Immediate Anesthesia Transfer of Care Note  Patient: Matthew Norton  Procedure(s) Performed: COLONOSCOPY WITH PROPOFOL (N/A )  Patient Location: PACU  Anesthesia Type:MAC  Level of Consciousness: awake  Airway & Oxygen Therapy: Patient Spontanous Breathing  Post-op Assessment: Report given to RN  Post vital signs: Reviewed  Last Vitals:  Vitals:   08/25/17 0736  BP: (!) 164/88  Pulse: 70  Resp: 16  Temp: 36.6 C  SpO2: 100%    Last Pain:  Vitals:   08/25/17 0736  TempSrc: Tympanic         Complications: No apparent anesthesia complications

## 2017-08-25 NOTE — Anesthesia Post-op Follow-up Note (Signed)
Anesthesia QCDR form completed.        

## 2017-08-25 NOTE — Op Note (Addendum)
[facility] Patient Name: Matthew Norton Procedure Date: 08/25/2017 7:31 AM MRN: [id number] Account #: [id number] Date of Birth: 1946-01-04 Admit Type: Ambulatory Age: 71 Room: [facility] ENDO ROOM 3 Gender: Male Note Status: Finalized Procedure:            Colonoscopy Indications:          High risk colon cancer surveillance: Personal history                        of colonic polyps Providers:            Jonathon Bellows MD, MD Referring MD:         Elveria Rising. Damita Dunnings, MD (Referring MD) Medicines:            Monitored Anesthesia Care Complications:        No immediate complications. Procedure:            Pre-Anesthesia Assessment:                       - Prior to the procedure, a History and Physical was                        performed, and patient medications, allergies and                        sensitivities were reviewed. The patient's tolerance of                        previous anesthesia was reviewed.                       - The risks and benefits of the procedure and the                        sedation options and risks were discussed with the                        patient. All questions were answered and informed                        consent was obtained.                       - ASA Grade Assessment: III - A patient with severe                        systemic disease.                       After obtaining informed consent, the colonoscope was                        passed under direct vision. Throughout the procedure,                        the patient's blood pressure, pulse, and oxygen                        saturations were monitored continuously. The  Colonoscope was introduced through the anus and                        advanced to the the cecum, identified by the                        appendiceal orifice, IC valve and transillumination.                        The colonoscopy was performed with moderate difficulty                    due to poor bowel prep and a tortuous colon. The                        patient tolerated the procedure well. The quality of                        the bowel preparation was poor. Findings:      The perianal and digital rectal examinations were normal.      Multiple small-mouthed diverticula were found in the sigmoid colon.      Non-bleeding internal hemorrhoids were found during retroflexion. The       hemorrhoids were medium-sized and Grade I (internal hemorrhoids that do       not prolapse).      Two sessile polyps were found in the ascending colon and cecum. The       polyps were 6 to 8 mm in size. These polyps were removed with a hot       snare. Resection and retrieval were complete.      Two sessile polyps were found in the ascending colon. The polyps were 5       to 7 mm in size. These polyps were removed with a cold snare. Resection       and retrieval were complete.      A 5 mm polyp was found in the sigmoid colon. The polyp was sessile. The       polyp was removed with a cold snare. Resection and retrieval were       complete.      A 20 mm polyp was found in the proximal descending colon. The polyp was       sessile. Preparations were made for mucosal resection. 7 mL of saline       was injected with adequate lift of the lesion from the muscularis       propria. Snare mucosal resection with suction (via the working channel)       retrieval was performed. A 22 mm area was resected. Resection was       complete, and retrieval was complete. There was no bleeding during, and       at the end, of the procedure. To prevent bleeding after mucosal       resection, two hemostatic clips were successfully placed. There was no       bleeding during, or at the end, of the procedure. Polyp was resectd       piece meal      The exam was otherwise without abnormality on direct and retroflexion       views. Impression:           - Preparation of the colon was poor.                        -  Diverticulosis in the sigmoid colon.                       - Non-bleeding internal hemorrhoids.                       - Two 6 to 8 mm polyps in the ascending colon and in                        the cecum, removed with a hot snare. Resected and                        retrieved.                       - Two 5 to 7 mm polyps in the ascending colon, removed                        with a cold snare. Resected and retrieved.                       - One 5 mm polyp in the sigmoid colon, removed with a                        cold snare. Resected and retrieved.                       - One 20 mm polyp in the proximal descending colon,                        removed with mucosal resection. Resected and retrieved.                        Clips were placed.                       - The examination was otherwise normal on direct and                        retroflexion views.                       - Mucosal resection was performed. Resection was                        complete, and retrieval was complete. Recommendation:       - Discharge patient to home (with escort).                       - Resume previous diet.                       - Continue present medications.                       - Await pathology results.                       - No ibuprofen, naproxen, or other non-steroidal                        anti-inflammatory drugs for 6 weeks after polyp  removal.                       - Await pathology results.                       - Repeat colonoscopy in 6 months for surveillance after                        piecemeal polypectomy. Procedure Code(s):    --- Professional ---                       [phone], 59, Colonoscopy, flexible; with endoscopic                        mucosal resection                       [phone], Colonoscopy, flexible; with removal of tumor(s),                        polyp(s), or other lesion(s) by snare technique Diagnosis Code(s):    --- Professional ---                        Z86.010, Personal history of colonic polyps                       K64.0, First degree hemorrhoids                       D12.0, Benign neoplasm of cecum                       D12.2, Benign neoplasm of ascending colon                       D12.5, Benign neoplasm of sigmoid colon                       D12.4, Benign neoplasm of descending colon                       K57.30, Diverticulosis of large intestine without                        perforation or abscess without bleeding CPT copyright 2016 American Medical Association. All rights reserved. The codes documented in this report are preliminary and upon coder review may  be revised to meet current compliance requirements. Jonathon Bellows, MD Jonathon Bellows MD, MD 08/25/2017 8:59:27 AM This report has been signed electronically. Number of Addenda: 0 Note Initiated On: 08/25/2017 7:31 AM Scope Withdrawal Time: 0 hours 23 minutes 40 seconds  Total Procedure Duration: 0 hours 42 minutes 55 seconds       [employer]

## 2017-08-25 NOTE — Anesthesia Postprocedure Evaluation (Signed)
Anesthesia Post Note  Patient: Matthew Norton  Procedure(s) Performed: COLONOSCOPY WITH PROPOFOL (N/A )  Patient location during evaluation: PACU Anesthesia Type: General Level of consciousness: awake and alert and oriented Pain management: pain level controlled Vital Signs Assessment: post-procedure vital signs reviewed and stable Respiratory status: spontaneous breathing Cardiovascular status: blood pressure returned to baseline Anesthetic complications: no     Last Vitals:  Vitals:   08/25/17 0922 08/25/17 0932  BP: 136/65 (!) 145/77  Pulse:    Resp: 16 14  Temp:    SpO2: 100% 100%    Last Pain:  Vitals:   08/25/17 0902  TempSrc: Axillary                 Sadiq Mccauley

## 2017-08-25 NOTE — Anesthesia Preprocedure Evaluation (Signed)
Anesthesia Evaluation  Patient identified by MRN, date of birth, ID band Patient awake    Reviewed: Allergy & Precautions, NPO status , Patient's Chart, lab work & pertinent test results, reviewed documented beta blocker date and time   History of Anesthesia Complications (+) PROLONGED EMERGENCE and history of anesthetic complications  Airway Mallampati: III  TM Distance: >3 FB Neck ROM: Full    Dental  (+) Missing,    Pulmonary shortness of breath and with exertion, neg sleep apnea, neg COPD, neg recent URI, Current Smoker,    breath sounds clear to auscultation       Cardiovascular hypertension, Pt. on medications (-) angina+ CAD, + Past MI, + Cardiac Stents and + Peripheral Vascular Disease  (-) dysrhythmias  Rhythm:Regular     Neuro/Psych PSYCHIATRIC DISORDERS Anxiety Depression Peripheral neuropathy  Neuromuscular disease    GI/Hepatic negative GI ROS, Neg liver ROS,   Endo/Other  Morbid obesity  Renal/GU negative Renal ROS     Musculoskeletal  (+) Arthritis , Osteoarthritis,    Abdominal   Peds negative pediatric ROS (+)  Hematology negative hematology ROS (+)   Anesthesia Other Findings   Reproductive/Obstetrics                             Anesthesia Physical  Anesthesia Plan  ASA: III  Anesthesia Plan: General   Post-op Pain Management:    Induction: Intravenous  PONV Risk Score and Plan:   Airway Management Planned: Nasal Cannula  Additional Equipment: None  Intra-op Plan:   Post-operative Plan:   Informed Consent: I have reviewed the patients History and Physical, chart, labs and discussed the procedure including the risks, benefits and alternatives for the proposed anesthesia with the patient or authorized representative who has indicated his/her understanding and acceptance.   Dental advisory given  Plan Discussed with: CRNA and Surgeon  Anesthesia Plan  Comments:         Anesthesia Quick Evaluation

## 2017-08-26 ENCOUNTER — Other Ambulatory Visit: Payer: Self-pay | Admitting: Family Medicine

## 2017-08-26 ENCOUNTER — Encounter: Payer: Self-pay | Admitting: Gastroenterology

## 2017-08-26 LAB — SURGICAL PATHOLOGY

## 2017-08-26 NOTE — Telephone Encounter (Signed)
Electronic refill request. Diazepam Last office visit:   06/21/17 Last Filled:    30 tablet 1 06/21/2017  Please advise.

## 2017-08-28 ENCOUNTER — Encounter: Payer: Self-pay | Admitting: Gastroenterology

## 2017-08-28 NOTE — Telephone Encounter (Signed)
Please call in.  Thanks.   

## 2017-08-28 NOTE — Progress Notes (Signed)
Panya schedule repeat colnoscopy in 6 months

## 2017-08-29 ENCOUNTER — Other Ambulatory Visit: Payer: Self-pay

## 2017-08-29 NOTE — Telephone Encounter (Signed)
Medication phoned to pharmacy.  

## 2017-09-14 ENCOUNTER — Telehealth: Payer: Self-pay | Admitting: Family Medicine

## 2017-09-14 NOTE — Telephone Encounter (Signed)
Copied from [place]. Topic: Inquiry >> Sep 14, 2017 11:14 AM Pricilla Handler wrote: Reason for CRM: Patient called stating that he is taking 1.5 pills of his diazepam (VALIUM) 5 MG tablet per day as instructed. However, he only receives a 30 Day (1 per Day) supply. Patient wants his quantity increased in order to continue taking 1 and a half pills per day.    Thank You!!!

## 2017-09-14 NOTE — Telephone Encounter (Signed)
Copied from [place]. Topic: Inquiry >> Sep 14, 2017 11:14 AM Pricilla Handler wrote: Reason for CRM: Patient called stating that he is taking 1.5 pills of his diazepam (VALIUM) 5 MG tablet per day as instructed. However, he only receives a 30 Day (1 per Day) supply. Patient wants his quantity increased in order to continue taking 1 and a half pills per day.    Thank You!!!

## 2017-09-14 NOTE — Telephone Encounter (Signed)
Matthew Norton called about getting more tablets with his refill for Valium. He states he was running low because he had been taking one and a half tablets of his 5 mg tablet. I told him that he should be taking only one tablet or a half tablet every 12 hours as need for anxiety. He thought I was talking about his Zoloft. I went over his medications with him and stressed that he read the label and only take the prescribed dose, If he ever needs clarification on his meds he can call us or the pharmacy. He voiced understanding.  He stated that even taking the Valium 5 mins before he goes to bed he is still waking up with bad dreams. He wants something to help him rest. I told him to speak with his pharmacy about recommending something he could get over the counter. We talked about the taking Tylenol sometimes that may help him to rest also.

## 2017-09-15 NOTE — Telephone Encounter (Signed)
Agreed, I'll defer to pharmacy.  Thanks for coaching patient re: use.

## 2017-10-12 DIAGNOSIS — I10 Essential (primary) hypertension: Secondary | ICD-10-CM | POA: Diagnosis not present

## 2017-10-12 DIAGNOSIS — R0602 Shortness of breath: Secondary | ICD-10-CM | POA: Diagnosis not present

## 2017-10-12 DIAGNOSIS — E7849 Other hyperlipidemia: Secondary | ICD-10-CM | POA: Diagnosis not present

## 2017-10-12 DIAGNOSIS — Z9889 Other specified postprocedural states: Secondary | ICD-10-CM | POA: Diagnosis not present

## 2017-10-12 DIAGNOSIS — I2510 Atherosclerotic heart disease of native coronary artery without angina pectoris: Secondary | ICD-10-CM | POA: Diagnosis not present

## 2017-10-22 ENCOUNTER — Other Ambulatory Visit: Payer: Self-pay | Admitting: Family Medicine

## 2017-10-24 NOTE — Telephone Encounter (Signed)
Electronic refill request.  Diazepam Last office visit:   06/21/17 Last Filled:    30 tablet 1 08/28/2017  Please advise.

## 2017-10-25 NOTE — Telephone Encounter (Signed)
Please call in.  Thanks.   

## 2017-10-25 NOTE — Telephone Encounter (Signed)
Medication phoned to pharmacy.  

## 2017-12-22 ENCOUNTER — Other Ambulatory Visit: Payer: Self-pay | Admitting: Family Medicine

## 2017-12-22 NOTE — Telephone Encounter (Signed)
Electronic refill request. Diazepam Last office visit:   06/21/17 Last Filled:    30 tablet 1 10/25/2017  Please advise.

## 2017-12-23 NOTE — Telephone Encounter (Signed)
Sent. Thanks.   

## 2018-02-23 ENCOUNTER — Other Ambulatory Visit: Payer: Self-pay | Admitting: Family Medicine

## 2018-02-23 NOTE — Telephone Encounter (Signed)
Electronic refill request. Diazepam Last office visit:   06/21/17 Last Filled:

## 2018-02-25 NOTE — Telephone Encounter (Signed)
Sent. Thanks.   

## 2018-04-24 ENCOUNTER — Other Ambulatory Visit: Payer: Self-pay | Admitting: Family Medicine

## 2018-04-24 NOTE — Telephone Encounter (Addendum)
Electronic refill request. Diazepam Last office visit:   06/21/17 Last Filled:    30 tablet 1 02/25/2018  Please advise.

## 2018-04-25 NOTE — Telephone Encounter (Signed)
Has f/u pending. Sent. Thanks.

## 2018-06-17 ENCOUNTER — Other Ambulatory Visit: Payer: Self-pay | Admitting: Family Medicine

## 2018-06-18 ENCOUNTER — Other Ambulatory Visit: Payer: Self-pay | Admitting: Family Medicine

## 2018-06-18 DIAGNOSIS — Z125 Encounter for screening for malignant neoplasm of prostate: Secondary | ICD-10-CM

## 2018-06-18 DIAGNOSIS — E538 Deficiency of other specified B group vitamins: Secondary | ICD-10-CM

## 2018-06-18 DIAGNOSIS — I10 Essential (primary) hypertension: Secondary | ICD-10-CM

## 2018-06-19 NOTE — Telephone Encounter (Signed)
Electronic refill request Last refill 04/25/18 #30/1 Last office visit 06/21/17-acute Upcoming appointment 06/26/18

## 2018-06-19 NOTE — Telephone Encounter (Signed)
Sent. Thanks.   

## 2018-06-21 ENCOUNTER — Ambulatory Visit: Payer: Medicare Other

## 2018-06-23 ENCOUNTER — Other Ambulatory Visit: Payer: Self-pay | Admitting: Family Medicine

## 2018-06-26 ENCOUNTER — Encounter: Payer: Medicare Other | Admitting: Family Medicine

## 2018-07-24 ENCOUNTER — Other Ambulatory Visit: Payer: Self-pay | Admitting: Family Medicine

## 2018-07-24 NOTE — Telephone Encounter (Signed)
Electronic refill request Diazepam Last office visit 06/21/17 Upcoming appointment 07/27/18 AWV Last refill 06/19/18 #30

## 2018-07-26 NOTE — Telephone Encounter (Signed)
Pt calling to check on this.  States that he is completely out of medication.

## 2018-07-26 NOTE — Telephone Encounter (Signed)
Sent. Thanks.   

## 2018-07-26 NOTE — Telephone Encounter (Signed)
Patient notified by telephone that script has been sent to the pharmacy. 

## 2018-07-27 ENCOUNTER — Ambulatory Visit: Payer: Medicare Other

## 2018-08-03 ENCOUNTER — Encounter: Payer: Self-pay | Admitting: Family Medicine

## 2018-08-03 ENCOUNTER — Ambulatory Visit (INDEPENDENT_AMBULATORY_CARE_PROVIDER_SITE_OTHER): Payer: Medicare Other | Admitting: Family Medicine

## 2018-08-03 ENCOUNTER — Ambulatory Visit (INDEPENDENT_AMBULATORY_CARE_PROVIDER_SITE_OTHER): Payer: Medicare Other

## 2018-08-03 VITALS — BP 122/70 | HR 67 | Temp 98.6°F | Ht 67.5 in | Wt 184.5 lb

## 2018-08-03 DIAGNOSIS — N401 Enlarged prostate with lower urinary tract symptoms: Secondary | ICD-10-CM

## 2018-08-03 DIAGNOSIS — F329 Major depressive disorder, single episode, unspecified: Secondary | ICD-10-CM

## 2018-08-03 DIAGNOSIS — M25519 Pain in unspecified shoulder: Secondary | ICD-10-CM | POA: Diagnosis not present

## 2018-08-03 DIAGNOSIS — N138 Other obstructive and reflux uropathy: Secondary | ICD-10-CM

## 2018-08-03 DIAGNOSIS — E538 Deficiency of other specified B group vitamins: Secondary | ICD-10-CM | POA: Diagnosis not present

## 2018-08-03 DIAGNOSIS — Z23 Encounter for immunization: Secondary | ICD-10-CM | POA: Diagnosis not present

## 2018-08-03 DIAGNOSIS — Z0000 Encounter for general adult medical examination without abnormal findings: Secondary | ICD-10-CM | POA: Diagnosis not present

## 2018-08-03 DIAGNOSIS — F419 Anxiety disorder, unspecified: Secondary | ICD-10-CM | POA: Diagnosis not present

## 2018-08-03 DIAGNOSIS — I10 Essential (primary) hypertension: Secondary | ICD-10-CM | POA: Diagnosis not present

## 2018-08-03 DIAGNOSIS — Z125 Encounter for screening for malignant neoplasm of prostate: Secondary | ICD-10-CM

## 2018-08-03 DIAGNOSIS — I2510 Atherosclerotic heart disease of native coronary artery without angina pectoris: Secondary | ICD-10-CM

## 2018-08-03 DIAGNOSIS — Z7189 Other specified counseling: Secondary | ICD-10-CM

## 2018-08-03 LAB — COMPREHENSIVE METABOLIC PANEL
ALBUMIN: 4.8 g/dL (ref 3.5–5.2)
ALK PHOS: 80 U/L (ref 39–117)
ALT: 12 U/L (ref 0–53)
AST: 13 U/L (ref 0–37)
BUN: 8 mg/dL (ref 6–23)
CALCIUM: 10.4 mg/dL (ref 8.4–10.5)
CHLORIDE: 100 meq/L (ref 96–112)
CO2: 28 mEq/L (ref 19–32)
Creatinine, Ser: 1.06 mg/dL (ref 0.40–1.50)
GFR: 88.59 mL/min (ref >60.00)
Glucose, Bld: 78 mg/dL (ref 70–99)
POTASSIUM: 4.4 meq/L (ref 3.5–5.1)
Sodium: 136 mEq/L (ref 135–145)
Total Bilirubin: 0.5 mg/dL (ref 0.2–1.2)
Total Protein: 8.4 g/dL — ABNORMAL HIGH (ref 6.0–8.3)

## 2018-08-03 LAB — LDL CHOLESTEROL, DIRECT: Direct LDL: 215 mg/dL

## 2018-08-03 LAB — LIPID PANEL
Cholesterol: 264 mg/dL — ABNORMAL HIGH (ref 0–200)
HDL: 33.4 mg/dL — ABNORMAL LOW (ref >39.00)
NONHDL: 230.44
TRIGLYCERIDES: 202 mg/dL — AB (ref 0.0–149.0)
Total CHOL/HDL Ratio: 8
VLDL: 40.4 mg/dL — ABNORMAL HIGH (ref 0.0–40.0)

## 2018-08-03 LAB — VITAMIN B12: VITAMIN B 12: 160 pg/mL — AB (ref 211–911)

## 2018-08-03 LAB — PSA, MEDICARE: PSA: 0.38 ng/mL (ref 0.10–4.00)

## 2018-08-03 MED ORDER — TAMSULOSIN HCL 0.4 MG PO CAPS: 0.4000 mg | ORAL_CAPSULE | Freq: Every day | ORAL | 3 refills | Status: DC

## 2018-08-03 NOTE — Progress Notes (Signed)
PCP notes:   Health maintenance:  Flu vaccine - administered  Abnormal screenings:   Hearing - failed  Hearing Screening   125Hz  250Hz  500Hz  1000Hz  2000Hz  3000Hz  4000Hz  6000Hz  8000Hz   Right ear:   40 40 40  0    Left ear:   40 0 0  0     Depression score: 3 Depression screen [facility] 2/9 08/03/2018 06/17/2017 04/29/2016 04/29/2016  Decreased Interest 3 3 0 0  Down, Depressed, Hopeless 0 1 0 0  PHQ - 2 Score 3 4 0 0  Altered sleeping 0 0 - -  Tired, decreased energy 0 3 - -  Change in appetite 0 0 - -  Feeling bad or failure about yourself  0 3 - -  Trouble concentrating 0 0 - -  Moving slowly or fidgety/restless 0 1 - -  Suicidal thoughts 0 0 - -  PHQ-9 Score 3 11 - -  Difficult doing work/chores Not difficult at all Not difficult at all - -   Patient concerns:   Left shoulder pain - chronic, 7/10.  Nurse concerns:  None  Next PCP appt:   08/03/2018 @ 1200  I reviewed health advisor's note, was available for consultation on the day of service listed in this note, and agree with documentation and plan. Elsie Stain, MD.

## 2018-08-03 NOTE — Patient Instructions (Signed)
[name] , Thank you for taking time to come for your Medicare Wellness Visit. I appreciate your ongoing commitment to your health goals. Please review the following plan we discussed and let me know if I can assist you in the future.   These are the goals we discussed: Goals    . Increase water intake     Starting 08/03/2018, I will attempt to drink at least 6-8 glasses of water daily.        This is a list of the screening recommended for you and due dates:  Health Maintenance  Topic Date Due  . Flu Shot  05/11/2018  . Tetanus Vaccine  10/11/2020  . Colon Cancer Screening  08/26/2027  .  Hepatitis C: One time screening is recommended by Center for Disease Control  (CDC) for  adults born from [birth year] through 1964.   Completed  . Pneumonia vaccines  Completed   Preventive Care for Adults  A healthy lifestyle and preventive care can promote health and wellness. Preventive health guidelines for adults include the following key practices.  . A routine yearly physical is a good way to check with your health care provider about your health and preventive screening. It is a chance to share any concerns and updates on your health and to receive a thorough exam.  . Visit your dentist for a routine exam and preventive care every 6 months. Brush your teeth twice a day and floss once a day. Good oral hygiene prevents tooth decay and gum disease.  . The frequency of eye exams is based on your age, health, family medical history, use  of contact lenses, and other factors. Follow your health care provider's recommendations for frequency of eye exams.  . Eat a healthy diet. Foods like vegetables, fruits, whole grains, low-fat dairy products, and lean protein foods contain the nutrients you need without too many calories. Decrease your intake of foods high in solid fats, added sugars, and salt. Eat the right amount of calories for you. Get information about a proper diet from your health care  provider, if necessary.  . Regular physical exercise is one of the most important things you can do for your health. Most adults should get at least 150 minutes of moderate-intensity exercise (any activity that increases your heart rate and causes you to sweat) each week. In addition, most adults need muscle-strengthening exercises on 2 or more days a week.  Silver Sneakers may be a benefit available to you. To determine eligibility, you may visit the website: www.silversneakers.com or contact program at (931)829-8064 Mon-Fri between 8AM-8PM.   . Maintain a healthy weight. The body mass index (BMI) is a screening tool to identify possible weight problems. It provides an estimate of body fat based on height and weight. Your health care provider can find your BMI and can help you achieve or maintain a healthy weight.   For adults 20 years and older: ? A BMI below 18.5 is considered underweight. ? A BMI of 18.5 to 24.9 is normal. ? A BMI of 25 to 29.9 is considered overweight. ? A BMI of 30 and above is considered obese.   . Maintain normal blood lipids and cholesterol levels by exercising and minimizing your intake of saturated fat. Eat a balanced diet with plenty of fruit and vegetables. Blood tests for lipids and cholesterol should begin at age 95 and be repeated every 5 years. If your lipid or cholesterol levels are high, you are over 50, or you  are at high risk for heart disease, you may need your cholesterol levels checked more frequently. Ongoing high lipid and cholesterol levels should be treated with medicines if diet and exercise are not working.  . If you smoke, find out from your health care provider how to quit. If you do not use tobacco, please do not start.  . If you choose to drink alcohol, please do not consume more than 2 drinks per day. One drink is considered to be 12 ounces (355 mL) of beer, 5 ounces (148 mL) of wine, or 1.5 ounces (44 mL) of liquor.  . If you are 98-79 years  old, ask your health care provider if you should take aspirin to prevent strokes.  . Use sunscreen. Apply sunscreen liberally and repeatedly throughout the day. You should seek shade when your shadow is shorter than you. Protect yourself by wearing long sleeves, pants, a wide-brimmed hat, and sunglasses year round, whenever you are outdoors.  . Once a month, do a whole body skin exam, using a mirror to look at the skin on your back. Tell your health care provider of new moles, moles that have irregular borders, moles that are larger than a pencil eraser, or moles that have changed in shape or color.

## 2018-08-03 NOTE — Patient Instructions (Addendum)
Call the heart clinic about a follow up appointment.   [phone] (Work) [phone] (Fax) [place] [facility] [place], [place] [address] Cardiovascular Disease  Call the GI clinic about a follow up appointment.    We will call about your referral about your shoulder.  Rosaria Ferries or Azalee Course will call you if you don't see one of them on the way out.   Go to the lab on the way out.  We'll contact you with your lab report. Take care.  Glad to see you.

## 2018-08-03 NOTE — Progress Notes (Signed)
Subjective:   Matthew Norton is a 73 y.o. male who presents for Medicare Annual/Subsequent preventive examination.  Review of Systems:  N/A Cardiac Risk Factors include: advanced age (>9men, >19 women);male gender;dyslipidemia;hypertension;smoking/ tobacco exposure     Objective:    Vitals: BP 122/70 (BP Location: Right Arm, Patient Position: Sitting, Cuff Size: Normal)   Pulse 67   Temp 98.6 F (37 C) (Oral)   Ht 5' 7.5" (1.715 m) Comment: SHOES  Wt 184 lb 8 oz (83.7 kg)   SpO2 98%   BMI 28.47 kg/m   Body mass index is 28.47 kg/m.  Advanced Directives 08/03/2018 08/25/2017 06/17/2017 03/31/2017 04/29/2016 03/18/2015 09/11/2013  Does Patient Have a Medical Advance Directive? No No No No No No Patient does not have advance directive;Patient would not like information  Would patient like information on creating a medical advance directive? No - Patient declined No - Patient declined No - Patient declined - Yes - Educational materials given No - patient declined information -    Tobacco Social History   Tobacco Use  Smoking Status Current Every Day Smoker  . Packs/day: 1.00  . Years: 50.00  . Pack years: 50.00  Smokeless Tobacco Never Used     Ready to quit: No Counseling given: No   Clinical Intake:  Pre-visit preparation completed: Yes  Pain Score: 7      Nutritional Status: BMI 25 -29 Overweight Nutritional Risks: None Diabetes: No  How often do you need to have someone help you when you read instructions, pamphlets, or other written materials from your doctor or pharmacy?: 1 - Never What is the last grade level you completed in school?: 9th grade  Interpreter Needed?: No  Comments: pt lives alone Information entered by :: LPinson, LPN  Past Medical History:  Diagnosis Date  . Anxiety    takes Valium daily as needed  . Arthritis   . Cataracts, bilateral   . Complication of anesthesia    slow to wake up after one surgery  . Coronary artery disease   .  Depression   . Hyperlipemia    has tried meds but not on any bc of joint aches and pains  . Hypertension    takes Metoprolol and Losartan daily  . Insomnia   . Joint pain   . Peripheral edema   . Shortness of breath    with exertion   . Weakness    numbness in both hands and feet.   Past Surgical History:  Procedure Laterality Date  . ANTERIOR CERVICAL DECOMP/DISCECTOMY FUSION N/A 03/24/2015   Procedure: Cervical four- five, anterior cervical decompression with fusion, plating and bonegraft;  Surgeon: Jovita Gamma, MD;  Location: [facility];  Service: Neurosurgery;  Laterality: N/A;  C45 anterior cervical decompression with fusion plating and bonegraft  . BACK SURGERY  2001   lumb lam  . CARDIAC CATHETERIZATION  08,11   stents x2  . [place]   fx rt   . COLONOSCOPY WITH PROPOFOL N/A 03/31/2017   Procedure: COLONOSCOPY WITH PROPOFOL;  Surgeon: Jonathon Bellows, MD;  Location: [facility] ENDOSCOPY;  Service: Endoscopy;  Laterality: N/A;  . COLONOSCOPY WITH PROPOFOL N/A 08/25/2017   Procedure: COLONOSCOPY WITH PROPOFOL;  Surgeon: Jonathon Bellows, MD;  Location: [facility] ENDOSCOPY;  Service: Endoscopy;  Laterality: N/A;  . CORONARY ANGIOPLASTY     2 stents  . ESOPHAGOGASTRODUODENOSCOPY    . HERNIA REPAIR  2007   rt and lt ing   . NAILBED REPAIR Left  09/13/2013   Procedure: LEFT THUMB NAIL REMOVAL, NAIL BED RECONSTRUCTION PARTIAL EXCISION DISTAL PHALANX WITH COMPLEX RECONSTRUCTION ;  Surgeon: Roseanne Kaufman, MD;  Location: [facility];  Service: Orthopedics;  Laterality: Left;  . parotid mass removed    . REPLANTATION THUMB  2003   left   Family History  Problem Relation Age of Onset  . Hypertension Mother   . Cancer Mother   . Cancer Father   . Colon cancer Father   . Prostate cancer Father    Social History   Socioeconomic History  . Marital status: Single    Spouse name: Not on file  . Number of children: Not on file  . Years of education: Not on file  .  Highest education level: Not on file  Occupational History  . Not on file  Social Needs  . Financial resource strain: Not on file  . Food insecurity:    Worry: Not on file    Inability: Not on file  . Transportation needs:    Medical: Not on file    Non-medical: Not on file  Tobacco Use  . Smoking status: Current Every Day Smoker    Packs/day: 1.00    Years: 50.00    Pack years: 50.00  . Smokeless tobacco: Never Used  Substance and Sexual Activity  . Alcohol use: No    Alcohol/week: 0.0 standard drinks  . Drug use: No  . Sexual activity: Never  Lifestyle  . Physical activity:    Days per week: Not on file    Minutes per session: Not on file  . Stress: Not on file  Relationships  . Social connections:    Talks on phone: Not on file    Gets together: Not on file    Attends religious service: Not on file    Active member of club or organization: Not on file    Attends meetings of clubs or organizations: Not on file    Relationship status: Not on file  Other Topics Concern  . Not on file  Social History Narrative   2 kids   In relationship with long term girlfriend    Outpatient Encounter Medications as of 08/03/2018  Medication Sig  . acetaminophen (TYLENOL) 500 MG tablet Take 2 tablets (1,000 mg total) by mouth every 8 (eight) hours as needed (for pain).  Marland Kitchen aspirin 81 MG tablet Take 81 mg by mouth daily.  . cetirizine (ZYRTEC) 10 MG tablet Take by mouth.  . diazepam (VALIUM) 5 MG tablet TAKE 1/2 TO 1 (ONE-HALF TO ONE) TABLET BY MOUTH EVERY 12 HOURS AS NEEDED FOR ANXIETY  . fluticasone (FLONASE) 50 MCG/ACT nasal spray Place 2 sprays into both nostrils daily.  Marland Kitchen losartan (COZAAR) 25 MG tablet TAKE 1 TABLET BY MOUTH ONCE DAILY  . nitroGLYCERIN (NITROSTAT) 0.4 MG SL tablet Place under the tongue every 5 (five) minutes as needed for chest pain. Reported on 04/29/2016  . sertraline (ZOLOFT) 100 MG tablet Take 1 tablet (100 mg total) by mouth daily.  . [DISCONTINUED]  sertraline (ZOLOFT) 100 MG tablet TAKE 1 & 1/2 (ONE & ONE-HALF) TABLETS BY MOUTH ONCE DAILY  . [DISCONTINUED] tamsulosin (FLOMAX) 0.4 MG CAPS capsule Take 1 capsule (0.4 mg total) by mouth daily. (Patient not taking: Reported on 08/03/2018)   No facility-administered encounter medications on file as of 08/03/2018.     Activities of Daily Living In your present state of health, do you have any difficulty performing the following activities: 08/03/2018  Hearing? N  Vision? Y  Difficulty concentrating or making decisions? Y  Walking or climbing stairs? Y  Dressing or bathing? N  Doing errands, shopping? Y  Preparing Food and eating ? N  Using the Toilet? N  In the past six months, have you accidently leaked urine? Y  Do you have problems with loss of bowel control? N  Managing your Medications? N  Managing your Finances? N  Housekeeping or managing your Housekeeping? N  Some recent data might be hidden    Patient Care Team: Tonia Ghent, MD as PCP - General (Family Medicine) Joan Flores Richard Miu, DO as Referring Physician (Optometry)   Assessment:   This is a routine wellness examination for Matthew Norton.   Hearing Screening   125Hz  250Hz  500Hz  1000Hz  2000Hz  3000Hz  4000Hz  6000Hz  8000Hz   Right ear:   40 40 40  0    Left ear:   40 0 0  0    Vision Screening Comments: Vision exam in 2018   Exercise Activities and Dietary recommendations Current Exercise Habits: The patient does not participate in regular exercise at present, Exercise limited by: None identified  Goals    . Increase water intake     Starting 08/03/2018, I will attempt to drink at least 6-8 glasses of water daily.        Fall Risk Fall Risk  08/03/2018 06/17/2017 04/29/2016 04/29/2016  Falls in the past year? No Yes No No  Comment - 7-8 falls over the previous 12 mths due to back or knees "giving away" - -  Number falls in past yr: - 2 or more - -  Injury with Fall? - Yes - -  Risk Factor Category  - High Fall Risk - -    Risk for fall due to : - Impaired balance/gait;Impaired mobility - -   Depression Screen PHQ 2/9 Scores 08/03/2018 06/17/2017 04/29/2016 04/29/2016  PHQ - 2 Score 3 4 0 0  PHQ- 9 Score 3 11 - -    Cognitive Function MMSE - Mini Mental State Exam 08/03/2018 06/17/2017  Orientation to time 5 5  Orientation to Place 5 5  Registration 3 3  Attention/ Calculation 0 0  Recall 3 2  Recall-comments - pt was unable to recall 1 of 3 words  Language- name 2 objects 0 0  Language- repeat 1 1  Language- follow 3 step command 3 3  Language- read & follow direction 0 0  Write a sentence 0 0  Copy design 0 0  Total score 20 19     PLEASE NOTE: A Mini-Cog screen was completed. Maximum score is 20. A value of 0 denotes this part of [place] MMSE was not completed or the patient failed this part of the Mini-Cog screening.   Mini-Cog Screening Orientation to Time - Max 5 pts Orientation to Place - Max 5 pts Registration - Max 3 pts Recall - Max 3 pts Language Repeat - Max 1 pts Language Follow 3 Step Command - Max 3 pts     Immunization History  Administered Date(s) Administered  . Influenza Split 07/18/2014  . Influenza,inj,Quad PF,6+ Mos 06/17/2017, 08/03/2018  . Pneumococcal Conjugate-13 04/29/2016  . Pneumococcal Polysaccharide-23 06/17/2017  . Zoster 08/09/2013    Screening Tests Health Maintenance  Topic Date Due  . TETANUS/TDAP  10/11/2020  . COLONOSCOPY  08/26/2027  . INFLUENZA VACCINE  Completed  . Hepatitis C Screening  Completed  . PNA vac Low Risk Adult  Completed  Plan:     I have personally reviewed and noted the following in the patient's chart:   . Medical and social history . Use of alcohol, tobacco or illicit drugs  . Current medications and supplements . Functional ability and status . Nutritional status . Physical activity . Advanced directives . List of other physicians . Hospitalizations, surgeries, and ER visits in previous 12  months . Vitals . Screenings to include cognitive, depression, and falls . Referrals and appointments  In addition, I have reviewed and discussed with patient certain preventive protocols, quality metrics, and best practice recommendations. A written personalized care plan for preventive services as well as general preventive health recommendations were provided to patient.     Lindell Noe, LPN  [id number]

## 2018-08-03 NOTE — Progress Notes (Signed)
Mood.  Still on sertraline 1 tab a day and "some days are better than others" with mood affected by shoulder pain.  No ADE on med.  Taking BZD prn, no ADE on med.  It helps.  No Si/Hi. We talked about getting a 3 business day lead time on the refills.    Hypertension:    Using medication without problems or lightheadedness: yes Chest pain with exertion:no Edema:no Short of breath: only as expected, "with a lot walking"  This is stable per patient report.   Average home BPs:no Statin intolerant.  He has been trying to lose weight with diet and exercise.  He cut back on intake overall and bread.    LUTS.  Prev on flomax. Off med currently. He did better on med w/o ADE.    He still has L thumb pain and hasn't had relief with any prev meds.  Worse with weather changes.   L shoulder pain with ROM, esp overhead ROM.  Getting worse over the last few weeks, but "it had been bad for a long time."    PSA pending. FH noted.  Colonoscopy 2018, d/w pt about f/u.  Letter given to patient.   Living will d/w pt.  If patient were incapacitated, then would have [employer].  Diet and exercise d/w pt.    Walking with cane.  Cautions d/w pt.   PMH and SH reviewed  ROS: Per HPI unless specifically indicated in ROS section   Meds, vitals, and allergies reviewed.   GEN: nad, alert and oriented HEENT: mucous membranes moist NECK: supple w/o LA CV: rrr PULM: ctab, no inc wob ABD: soft, +bs EXT: no edema SKIN: no acute rash Left shoulder with passive range of motion much better than active range of motion, before he begins to have pain.

## 2018-08-06 ENCOUNTER — Other Ambulatory Visit: Payer: Self-pay | Admitting: Family Medicine

## 2018-08-06 DIAGNOSIS — Z0000 Encounter for general adult medical examination without abnormal findings: Secondary | ICD-10-CM | POA: Insufficient documentation

## 2018-08-06 DIAGNOSIS — N401 Enlarged prostate with lower urinary tract symptoms: Secondary | ICD-10-CM

## 2018-08-06 DIAGNOSIS — M25519 Pain in unspecified shoulder: Secondary | ICD-10-CM | POA: Insufficient documentation

## 2018-08-06 DIAGNOSIS — E538 Deficiency of other specified B group vitamins: Secondary | ICD-10-CM

## 2018-08-06 DIAGNOSIS — N138 Other obstructive and reflux uropathy: Secondary | ICD-10-CM | POA: Insufficient documentation

## 2018-08-06 MED ORDER — CYANOCOBALAMIN 1000 MCG/ML IJ SOLN: INTRAMUSCULAR | Status: DC

## 2018-08-06 NOTE — Assessment & Plan Note (Signed)
No chest pain.  I asked him to follow-up with cardiology.  See after visit summary.

## 2018-08-06 NOTE — Assessment & Plan Note (Signed)
Living will d/w pt.  If patient were incapacitated, then would have Carolyn Staten designated.  ?

## 2018-08-06 NOTE — Assessment & Plan Note (Signed)
Refer to orthopedics.  Discussed with patient.  He agrees.

## 2018-08-06 NOTE — Assessment & Plan Note (Signed)
Still on sertraline 1 tab a day and "some days are better than others" with mood affected by shoulder pain.  No ADE on med.  Taking BZD prn, no ADE on med.  It helps.  No Si/Hi. We talked about getting a 3 business day lead time on the refills.   He is going to see about getting treatment for his left shoulder which may help with his mood.  Update me as needed.  No reason to change his medication in the meantime.

## 2018-08-06 NOTE — Assessment & Plan Note (Addendum)
Statin intolerant.  Discussed diet and exercise.  No change in meds.  See notes on labs. >25 minutes spent in face to face time with patient, >50% spent in counselling or coordination of care.

## 2018-08-06 NOTE — Assessment & Plan Note (Signed)
PSA pending. FH noted.  Colonoscopy 2018, d/w pt about f/u.  Letter given to patient.   Living will d/w pt.  If patient were incapacitated, then would have [employer].  Diet and exercise d/w pt.

## 2018-08-06 NOTE — Assessment & Plan Note (Signed)
Reasonable to restart Flomax.  No adverse effect on medication previously.  See notes on PSA.

## 2018-08-10 ENCOUNTER — Telehealth: Payer: Self-pay

## 2018-08-10 NOTE — Telephone Encounter (Signed)
Left message for patient to call me back in regards to a referral appointment-Zissel Biederman V Francille Wittmann, RMA

## 2018-08-21 ENCOUNTER — Other Ambulatory Visit: Payer: Self-pay | Admitting: Family Medicine

## 2018-08-21 NOTE — Telephone Encounter (Signed)
Patient asks if he can get a 2 months supply.  He said transportation is limited.  It is hard to get someone to pick it up.

## 2018-08-21 NOTE — Telephone Encounter (Signed)
Electronic refill request Diazepam Last office visit 08/03/18 Last refill 07/26/18 #30

## 2018-08-21 NOTE — Telephone Encounter (Signed)
Sent. Thanks.   

## 2018-08-24 ENCOUNTER — Ambulatory Visit: Payer: Medicare Other

## 2018-08-24 DIAGNOSIS — M7541 Impingement syndrome of right shoulder: Secondary | ICD-10-CM | POA: Diagnosis not present

## 2018-08-24 DIAGNOSIS — M7542 Impingement syndrome of left shoulder: Secondary | ICD-10-CM | POA: Diagnosis not present

## 2018-09-21 DIAGNOSIS — M19011 Primary osteoarthritis, right shoulder: Secondary | ICD-10-CM | POA: Diagnosis not present

## 2018-09-21 DIAGNOSIS — M19012 Primary osteoarthritis, left shoulder: Secondary | ICD-10-CM | POA: Diagnosis not present

## 2018-09-21 DIAGNOSIS — M7542 Impingement syndrome of left shoulder: Secondary | ICD-10-CM | POA: Diagnosis not present

## 2018-09-26 ENCOUNTER — Telehealth: Payer: Self-pay | Admitting: Family Medicine

## 2018-09-26 ENCOUNTER — Encounter

## 2018-09-26 ENCOUNTER — Ambulatory Visit (INDEPENDENT_AMBULATORY_CARE_PROVIDER_SITE_OTHER): Payer: Medicare Other | Admitting: Emergency Medicine

## 2018-09-26 DIAGNOSIS — E538 Deficiency of other specified B group vitamins: Secondary | ICD-10-CM | POA: Diagnosis not present

## 2018-09-26 MED ORDER — CYANOCOBALAMIN 1000 MCG/ML IJ SOLN
1000.0000 ug | Freq: Once | INTRAMUSCULAR | Status: AC
  Administered 2018-09-26: 1000 ug via INTRAMUSCULAR

## 2018-09-26 NOTE — Progress Notes (Signed)
Per orders of Dr.Duncan, injection of B12 given by Elmon Kirschner. Patient tolerated injection well. Patient received in right deltoid.

## 2018-09-26 NOTE — Telephone Encounter (Signed)
Pt wanted to let Dr. Damita Dunnings know that Dr. Harlow Mares at [facility] increased his Valium to 3 a day because it is a good muscle relaxer. He states it has helped but he is running low. He's requesting a refill.

## 2018-09-27 MED ORDER — DIAZEPAM 5 MG PO TABS: ORAL_TABLET | ORAL | 0 refills | Status: DC

## 2018-09-27 NOTE — Telephone Encounter (Signed)
Patient notified by telephone that script has been sent to the pharmacy. 

## 2018-09-27 NOTE — Telephone Encounter (Signed)
Spoke to Santiago Glad at Dr. Alvira Philips office [phone]) and was advised that in his note he did advise patient to take two Diazepam in the evening which may help.

## 2018-09-27 NOTE — Telephone Encounter (Signed)
Noted. Thanks.  Sent.

## 2018-09-27 NOTE — Telephone Encounter (Signed)
I did not see that med change listed in the most recent notes.  Please verify with Ortho that this is the case before I refill it.  Please let me know.  Thanks.

## 2018-10-05 ENCOUNTER — Ambulatory Visit (INDEPENDENT_AMBULATORY_CARE_PROVIDER_SITE_OTHER): Payer: Medicare Other | Admitting: *Deleted

## 2018-10-05 ENCOUNTER — Telehealth: Payer: Self-pay | Admitting: *Deleted

## 2018-10-05 DIAGNOSIS — E538 Deficiency of other specified B group vitamins: Secondary | ICD-10-CM

## 2018-10-05 MED ORDER — CYANOCOBALAMIN 1000 MCG/ML IJ SOLN
1000.0000 ug | Freq: Once | INTRAMUSCULAR | Status: AC
  Administered 2018-10-05: 1000 ug via INTRAMUSCULAR

## 2018-10-05 NOTE — Telephone Encounter (Signed)
Patient came in for the weekly B12 injection and asked to get a message to Dr. Damita Dunnings that his medication for his prostate (I'm assuming Flomax) is working at night but during the day he constantly dribbles and does not have a definitive stream.  Patient is aware that Dr. Damita Dunnings will not be back in the office until 10/12/18.

## 2018-10-09 DIAGNOSIS — M7542 Impingement syndrome of left shoulder: Secondary | ICD-10-CM | POA: Diagnosis not present

## 2018-10-11 MED ORDER — TAMSULOSIN HCL 0.4 MG PO CAPS: 0.8000 mg | ORAL_CAPSULE | Freq: Every day | ORAL | 3 refills | Status: DC

## 2018-10-11 NOTE — Addendum Note (Signed)
Addended by: Tonia Ghent on: 10/11/2018 10:47 PM   Modules accepted: Orders

## 2018-10-11 NOTE — Telephone Encounter (Signed)
He can try taking 2 of the Flomax tabs a day.  He can take them together.  New prescription sent.  If that does not help or if he does not tolerate that then let me know.  Thanks.

## 2018-10-12 ENCOUNTER — Ambulatory Visit: Payer: Medicare Other

## 2018-10-12 NOTE — Telephone Encounter (Signed)
Patient advised.

## 2018-10-13 DIAGNOSIS — M19011 Primary osteoarthritis, right shoulder: Secondary | ICD-10-CM | POA: Diagnosis not present

## 2018-10-17 ENCOUNTER — Ambulatory Visit (INDEPENDENT_AMBULATORY_CARE_PROVIDER_SITE_OTHER): Payer: Medicare Other | Admitting: Family Medicine

## 2018-10-17 DIAGNOSIS — E538 Deficiency of other specified B group vitamins: Secondary | ICD-10-CM | POA: Diagnosis not present

## 2018-10-17 MED ORDER — CYANOCOBALAMIN 1000 MCG/ML IJ SOLN
1000.0000 ug | Freq: Once | INTRAMUSCULAR | Status: AC
  Administered 2018-10-17: 1000 ug via INTRAMUSCULAR

## 2018-10-17 NOTE — Progress Notes (Signed)
Per orders of Dr. Damita Dunnings  injection of Vit B12  given by Christena Deem. Patient tolerated injection well.

## 2018-10-19 DIAGNOSIS — H524 Presbyopia: Secondary | ICD-10-CM | POA: Diagnosis not present

## 2018-10-26 ENCOUNTER — Ambulatory Visit (INDEPENDENT_AMBULATORY_CARE_PROVIDER_SITE_OTHER): Payer: Medicare Other

## 2018-10-26 DIAGNOSIS — E538 Deficiency of other specified B group vitamins: Secondary | ICD-10-CM | POA: Diagnosis not present

## 2018-10-26 MED ORDER — CYANOCOBALAMIN 1000 MCG/ML IJ SOLN
1000.0000 ug | Freq: Once | INTRAMUSCULAR | Status: AC
  Administered 2018-10-26: 1000 ug via INTRAMUSCULAR

## 2018-10-26 NOTE — Progress Notes (Signed)
Pt given 4th out of 4 weekly B12 injections. Tolerated Well. Advised him to schedule next appointment for 1 month.

## 2018-10-31 ENCOUNTER — Other Ambulatory Visit: Payer: Self-pay | Admitting: Family Medicine

## 2018-10-31 NOTE — Telephone Encounter (Signed)
Electronic refill request. Diazepam Last office visit:   10/17/2018 Last Filled:     90 tablet 0 09/27/2018  Please advise.

## 2018-11-01 NOTE — Telephone Encounter (Signed)
Sent. Thanks.   

## 2018-11-30 ENCOUNTER — Ambulatory Visit: Payer: Medicare Other

## 2018-11-30 ENCOUNTER — Other Ambulatory Visit: Payer: Self-pay | Admitting: Family Medicine

## 2018-11-30 NOTE — Telephone Encounter (Signed)
Electronic refill request. Diazepam Last office visit:   10/17/2018 Last Filled:    90 tablet 0 11/01/2018  Please advise.

## 2018-12-01 NOTE — Telephone Encounter (Signed)
Sent. Thanks.   

## 2018-12-05 ENCOUNTER — Ambulatory Visit (INDEPENDENT_AMBULATORY_CARE_PROVIDER_SITE_OTHER): Payer: Medicare Other

## 2018-12-05 DIAGNOSIS — E538 Deficiency of other specified B group vitamins: Secondary | ICD-10-CM | POA: Diagnosis not present

## 2018-12-05 MED ORDER — CYANOCOBALAMIN 1000 MCG/ML IJ SOLN
1000.0000 ug | Freq: Once | INTRAMUSCULAR | Status: AC
  Administered 2018-12-05: 1000 ug via INTRAMUSCULAR

## 2018-12-05 NOTE — Progress Notes (Signed)
Matthew Norton SMA gave monthly B12 injection of 1000 mcg/ml in left deltoid. Patient tolerated well.

## 2018-12-08 ENCOUNTER — Ambulatory Visit (INDEPENDENT_AMBULATORY_CARE_PROVIDER_SITE_OTHER): Payer: Medicare Other | Admitting: Family Medicine

## 2018-12-08 ENCOUNTER — Encounter: Payer: Self-pay | Admitting: Family Medicine

## 2018-12-08 ENCOUNTER — Ambulatory Visit (INDEPENDENT_AMBULATORY_CARE_PROVIDER_SITE_OTHER)
Admission: RE | Admit: 2018-12-08 | Discharge: 2018-12-08 | Disposition: A | Discharge status: Home / Self Care | Payer: Medicare Other | Source: Ambulatory Visit | Attending: Family Medicine | Admitting: Family Medicine

## 2018-12-08 VITALS — BP 102/62 | HR 79 | Temp 97.9°F | Ht 67.5 in | Wt 193.0 lb

## 2018-12-08 DIAGNOSIS — E538 Deficiency of other specified B group vitamins: Secondary | ICD-10-CM

## 2018-12-08 DIAGNOSIS — M25562 Pain in left knee: Secondary | ICD-10-CM

## 2018-12-08 DIAGNOSIS — F329 Major depressive disorder, single episode, unspecified: Secondary | ICD-10-CM

## 2018-12-08 DIAGNOSIS — M1711 Unilateral primary osteoarthritis, right knee: Secondary | ICD-10-CM | POA: Diagnosis not present

## 2018-12-08 DIAGNOSIS — M25561 Pain in right knee: Secondary | ICD-10-CM

## 2018-12-08 DIAGNOSIS — R7989 Other specified abnormal findings of blood chemistry: Secondary | ICD-10-CM

## 2018-12-08 DIAGNOSIS — F32A Depression, unspecified: Secondary | ICD-10-CM

## 2018-12-08 DIAGNOSIS — M1712 Unilateral primary osteoarthritis, left knee: Secondary | ICD-10-CM | POA: Diagnosis not present

## 2018-12-08 DIAGNOSIS — F419 Anxiety disorder, unspecified: Secondary | ICD-10-CM | POA: Diagnosis not present

## 2018-12-08 MED ORDER — NITROGLYCERIN 0.4 MG SL SUBL: 0.4000 mg | SUBLINGUAL_TABLET | SUBLINGUAL | 3 refills | Status: DC | PRN

## 2018-12-08 MED ORDER — MELOXICAM 7.5 MG PO TABS: 7.5000 mg | ORAL_TABLET | Freq: Every day | ORAL | 1 refills | Status: DC

## 2018-12-08 NOTE — Patient Instructions (Signed)
Take meloxicam daily with food.  Go to the lab on the way out.  We'll contact you with your xray report. It not better with the medicine, then ask about seeing Dr. Lorelei Pont.  Ask about getting your B12 shot in the morning next month.  Take care.

## 2018-12-08 NOTE — Progress Notes (Signed)
He needed refill on NTG, never used but old rx is out of date.   B knee pain.  Walking with cane.  Feels unsteady due to chronic knee changes. Pain with weather changes, worse with walking.  H/o falls d/w pt. cautions discussed with patient.  Routine benzodiazepine cautions discussed with patient.  History of anxiety.  He is on SSRI to decrease his need for benzodiazepines.  He still reports having troubling dreams.  It appears that his anxiety was improved previously with addition of SSRI.  Discussed.  No suicidal or homicidal intent.  Rationale for treatment discussed with patient.  He had some insomnia the last time he had a B12 shot in the afternoon and he asked about getting this done in the morning on his next dose.  See after visit summary.  The bigger issue appears to be his knee pain since he was having so much discomfort at baseline.  He agreed with that.  Meds, vitals, and allergies reviewed.   ROS: Per HPI unless specifically indicated in ROS section   nad ncat Speech at baseline Neck supple, no LA rrr ctab abd soft Ext w/o edema.  Bilateral knee exam is similar.  He has chronic joint line changes noted.  He has bilateral crepitus on range of motion.  No bruising.  No erythema.  Able to bear weight.  Patella not tender on either knee.

## 2018-12-10 DIAGNOSIS — M25562 Pain in left knee: Secondary | ICD-10-CM

## 2018-12-10 DIAGNOSIS — M25561 Pain in right knee: Secondary | ICD-10-CM | POA: Insufficient documentation

## 2018-12-10 NOTE — Assessment & Plan Note (Signed)
He asked about getting his next dose in the morning.  He will check up front about this for scheduling.

## 2018-12-10 NOTE — Assessment & Plan Note (Signed)
It previously appeared that he had benefited from SSRI use.  Rationale for use discussed with patient, so that he would need benzodiazepine less.  Would continue current medications for now.  He agrees.  Okay for outpatient follow-up.

## 2018-12-10 NOTE — Assessment & Plan Note (Addendum)
Presumed arthritis.  He has crepitus on range of motion.  Discussed options.  Routine cautions given.  See notes on imaging. Take meloxicam daily with food.  It not better with meloxicam, then I want him to ask about seeing Dr. Lorelei Pont with the sports medicine clinic. >25 minutes spent in face to face time with patient, >50% spent in counselling or coordination of care

## 2019-01-04 ENCOUNTER — Ambulatory Visit: Payer: Medicare Other

## 2019-01-14 ENCOUNTER — Other Ambulatory Visit: Payer: Self-pay | Admitting: Family Medicine

## 2019-01-15 NOTE — Telephone Encounter (Signed)
Last filled 12-01-18 #90 Last OV 12-08-18 Next OV 08-09-19 Desha

## 2019-01-16 NOTE — Telephone Encounter (Signed)
Sent. Thanks.  And he needs B12 level prior to next B12 shot.  Order is in EMR.  Thanks.

## 2019-02-01 ENCOUNTER — Ambulatory Visit: Payer: Medicare Other

## 2019-03-01 ENCOUNTER — Other Ambulatory Visit: Payer: Self-pay | Admitting: Family Medicine

## 2019-03-01 NOTE — Telephone Encounter (Signed)
Last office visit 12/08/2018 for Bilateral knee pain.  Last refilled 01/16/2019 for #90 with no refills. CPE scheduled for 08/09/2019.

## 2019-03-02 NOTE — Telephone Encounter (Signed)
Sent. Thanks.  Needs B12 level drawn at appointment on 03/20/2019. Order for B12 level is in EMR already.

## 2019-03-02 NOTE — Telephone Encounter (Signed)
Added patient to the lab schedule for that day and added comment for the nurse on that day

## 2019-03-20 ENCOUNTER — Other Ambulatory Visit: Payer: Self-pay | Admitting: Family Medicine

## 2019-03-20 ENCOUNTER — Other Ambulatory Visit (INDEPENDENT_AMBULATORY_CARE_PROVIDER_SITE_OTHER): Payer: Medicare Other

## 2019-03-20 ENCOUNTER — Ambulatory Visit (INDEPENDENT_AMBULATORY_CARE_PROVIDER_SITE_OTHER): Payer: Medicare Other

## 2019-03-20 DIAGNOSIS — E538 Deficiency of other specified B group vitamins: Secondary | ICD-10-CM

## 2019-03-20 LAB — VITAMIN B12: Vitamin B-12: 124 pg/mL — ABNORMAL LOW (ref 211–911)

## 2019-03-20 MED ORDER — CYANOCOBALAMIN 1000 MCG/ML IJ SOLN
1000.0000 ug | Freq: Once | INTRAMUSCULAR | Status: AC
  Administered 2019-03-20: 1000 ug via INTRAMUSCULAR

## 2019-03-20 MED ORDER — CYANOCOBALAMIN 1000 MCG/ML IJ SOLN: INTRAMUSCULAR | Status: DC

## 2019-03-20 NOTE — Progress Notes (Signed)
Per orders of Dr. Damita Dunnings, injection of Vitamin B12 given by Diamond Nickel, RN.  Administered IM to right deltoid.   Patient also had B12 level drawn before administration.   Patient tolerated injection well.

## 2019-03-23 ENCOUNTER — Other Ambulatory Visit: Payer: Self-pay | Admitting: Family Medicine

## 2019-03-23 DIAGNOSIS — E538 Deficiency of other specified B group vitamins: Secondary | ICD-10-CM

## 2019-03-23 DIAGNOSIS — R7989 Other specified abnormal findings of blood chemistry: Secondary | ICD-10-CM

## 2019-03-23 MED ORDER — CYANOCOBALAMIN 1000 MCG/ML IJ SOLN: INTRAMUSCULAR | Status: DC

## 2019-03-29 ENCOUNTER — Other Ambulatory Visit: Payer: Self-pay

## 2019-03-29 ENCOUNTER — Ambulatory Visit: Payer: Medicare Other

## 2019-03-29 DIAGNOSIS — E538 Deficiency of other specified B group vitamins: Secondary | ICD-10-CM

## 2019-03-29 MED ORDER — CYANOCOBALAMIN 1000 MCG/ML IJ SOLN: 1000.0000 ug | Freq: Once | INTRAMUSCULAR | Status: DC

## 2019-03-29 NOTE — Progress Notes (Signed)
Consulted with Dr Damita Dunnings prior to injection. Decide this will be #1 of 4 weekly injections he will get. He will then go back to monthly.   Pt given injection in left deltoid. Tolerated well.

## 2019-04-05 ENCOUNTER — Telehealth: Payer: Self-pay

## 2019-04-05 ENCOUNTER — Ambulatory Visit (INDEPENDENT_AMBULATORY_CARE_PROVIDER_SITE_OTHER): Payer: Medicare Other

## 2019-04-05 DIAGNOSIS — E538 Deficiency of other specified B group vitamins: Secondary | ICD-10-CM

## 2019-04-05 MED ORDER — CYANOCOBALAMIN 1000 MCG/ML IJ SOLN
1000.0000 ug | Freq: Once | INTRAMUSCULAR | Status: AC
  Administered 2019-04-05: 1000 ug via INTRAMUSCULAR

## 2019-04-05 NOTE — Progress Notes (Signed)
Pt given #2 of 4 weekly B12 injections in right deltoid. Tolerated well.  Pt wanted to let Dr Damita Dunnings know he is feeling any benefits from the injections. I advised him this was only #2 of the 4 weekly and it may take some time. I have opened an TE.

## 2019-04-05 NOTE — Telephone Encounter (Signed)
While getting his B12 injection, he said he was not feeling any difference from the injections. I mentioned that it is only his 2nd weekly injection but that I would get with Dr Damita Dunnings and someone would let him know. When we call him back, he needs to schedule next week's injection.

## 2019-04-06 NOTE — Telephone Encounter (Signed)
Left message for patient to call back  

## 2019-04-06 NOTE — Telephone Encounter (Signed)
He needs to give it longer.  It may take months to get his level back up.

## 2019-04-10 NOTE — Telephone Encounter (Signed)
Patient advised.

## 2019-04-12 ENCOUNTER — Telehealth: Payer: Self-pay | Admitting: *Deleted

## 2019-04-12 ENCOUNTER — Ambulatory Visit (INDEPENDENT_AMBULATORY_CARE_PROVIDER_SITE_OTHER): Payer: Medicare Other | Admitting: *Deleted

## 2019-04-12 DIAGNOSIS — E538 Deficiency of other specified B group vitamins: Secondary | ICD-10-CM

## 2019-04-12 MED ORDER — CYANOCOBALAMIN 1000 MCG/ML IJ SOLN
1000.0000 ug | Freq: Once | INTRAMUSCULAR | Status: AC
  Administered 2019-04-12: 1000 ug via INTRAMUSCULAR

## 2019-04-12 NOTE — Telephone Encounter (Signed)
Patient came in for his Vitamin B12 injection today and stated that when he gets these injections, a knot comes up under the injection site and a scab forms and it itches.  Patient showed me the site that he was explaining and it is a fairly significant scab at the last injection site.  I'm not sure if this might have formed from scratching due to the itching or an actual reaction to the medication.  Please advise.

## 2019-04-12 NOTE — Telephone Encounter (Signed)
Patient advised.

## 2019-04-12 NOTE — Telephone Encounter (Signed)
I can't see how he would have an allergy to B12 but he could have an injection site reaction.  I think it makes sense to rotate the sites for injection and use OTC hydrocortisone on the site prn for itching.  That should help.  If that doesn't help at all then let me know.  Thanks.

## 2019-04-16 ENCOUNTER — Other Ambulatory Visit: Payer: Self-pay | Admitting: Family Medicine

## 2019-04-17 ENCOUNTER — Other Ambulatory Visit: Payer: Self-pay | Admitting: Family Medicine

## 2019-04-17 MED ORDER — LOSARTAN POTASSIUM 25 MG PO TABS: 25.0000 mg | ORAL_TABLET | Freq: Every day | ORAL | 0 refills | Status: DC

## 2019-04-17 NOTE — Telephone Encounter (Signed)
Patient called and he's out of his Losartan. Patient's requesting a refill be sent to [place].

## 2019-04-17 NOTE — Telephone Encounter (Signed)
Eprescribed.

## 2019-04-17 NOTE — Telephone Encounter (Signed)
I spoke with pt because pts med list has losartan 25 mg taking one daily # 90 x 2 on 04/26/17. Pt said he has a bottle dated 03/12/19 for # 30 of losartan 25 mg. Pt thought Dr Damita Dunnings had prescribed. I spoke with Beaulah Corin at [facility] [address] and she said pt did get losartan 25 mg # 30 filled on 03/12/19 but was prescribed by Clabe Seal at [facility].  Beaulah Corin said had requested refill from [facility] was not sure which dept at [facility]) and refill was denied. Please advise. 08/09/19 pt is scheduled for annual exam.Please advise.

## 2019-04-17 NOTE — Telephone Encounter (Signed)
I've refilled #90 losartan 25mg  until he sees PCP.

## 2019-04-17 NOTE — Telephone Encounter (Signed)
Matthew Norton pt, Last office visit 12/08/2018 for Bilateral knee pain.  Last refilled 03/02/2019 for #90 with no refills. CPE scheduled for 08/09/2019.  Please advise

## 2019-04-19 ENCOUNTER — Ambulatory Visit: Payer: Medicare Other

## 2019-04-20 NOTE — Telephone Encounter (Signed)
Left message on vm per dpr relaying Dr. G's message.  

## 2019-04-26 ENCOUNTER — Ambulatory Visit (INDEPENDENT_AMBULATORY_CARE_PROVIDER_SITE_OTHER): Payer: Medicare Other

## 2019-04-26 DIAGNOSIS — E538 Deficiency of other specified B group vitamins: Secondary | ICD-10-CM

## 2019-04-26 MED ORDER — CYANOCOBALAMIN 1000 MCG/ML IJ SOLN
1000.0000 ug | Freq: Once | INTRAMUSCULAR | Status: AC
  Administered 2019-04-26: 1000 ug via INTRAMUSCULAR

## 2019-04-26 NOTE — Progress Notes (Signed)
Per orders of Dr. Damita Dunnings, injection of Vitamin B12 given by Diamond Nickel, RN.  Administered to left deltoid IM.   Patient tolerated injection well.

## 2019-05-02 NOTE — Progress Notes (Signed)
Agree. Thanks

## 2019-05-03 ENCOUNTER — Other Ambulatory Visit: Payer: Self-pay

## 2019-05-03 ENCOUNTER — Ambulatory Visit (INDEPENDENT_AMBULATORY_CARE_PROVIDER_SITE_OTHER): Payer: Medicare Other

## 2019-05-03 DIAGNOSIS — E538 Deficiency of other specified B group vitamins: Secondary | ICD-10-CM | POA: Diagnosis not present

## 2019-05-03 MED ORDER — CYANOCOBALAMIN 1000 MCG/ML IJ SOLN
1000.0000 ug | Freq: Once | INTRAMUSCULAR | Status: AC
  Administered 2019-05-03: 09:00:00 1000 ug via INTRAMUSCULAR

## 2019-05-03 NOTE — Progress Notes (Signed)
Spoke to Dr Damita Dunnings prior to his arrival that a shot today would actually be 5 weekly ones instead of 4. But, someone has already set him up for injections every 2 weeks starting 05-17-19. To keep him on that schedule, Dr Damita Dunnings said to go ahead and give him the injection today.  B12 given in right deltoid. Pt tolerated well.

## 2019-05-15 ENCOUNTER — Telehealth: Payer: Self-pay | Admitting: *Deleted

## 2019-05-15 NOTE — Telephone Encounter (Signed)
Left message on voicemail for patient to call back. When patient calls back need to confirm nurse visit Thursday. Advise patient front of building/curbside and complete covid screening.

## 2019-05-16 NOTE — Telephone Encounter (Signed)
Patient rescheduled his appointment because of transportation problems.

## 2019-05-17 ENCOUNTER — Ambulatory Visit: Payer: Medicare Other

## 2019-05-23 ENCOUNTER — Other Ambulatory Visit: Payer: Self-pay

## 2019-05-23 ENCOUNTER — Ambulatory Visit (INDEPENDENT_AMBULATORY_CARE_PROVIDER_SITE_OTHER): Payer: Medicare Other

## 2019-05-23 DIAGNOSIS — E538 Deficiency of other specified B group vitamins: Secondary | ICD-10-CM | POA: Diagnosis not present

## 2019-05-23 MED ORDER — CYANOCOBALAMIN 1000 MCG/ML IJ SOLN
1000.0000 ug | Freq: Once | INTRAMUSCULAR | Status: AC
  Administered 2019-05-23: 10:00:00 1000 ug via INTRAMUSCULAR

## 2019-05-23 NOTE — Progress Notes (Signed)
Per orders of Dr. Leonard Downing signed by Dr. Danise Mina, injection of B12-gets is every 14 days given by Kris Mouton. Patient tolerated injection well.

## 2019-05-29 ENCOUNTER — Other Ambulatory Visit: Payer: Self-pay | Admitting: Family Medicine

## 2019-05-30 NOTE — Telephone Encounter (Signed)
Electronic refill request Diazepam Last refill 04/17/19 #90 Last office visit 11/18/18 acute Upcoming appointment 08/09/19 CPE

## 2019-05-31 ENCOUNTER — Ambulatory Visit: Payer: Medicare Other

## 2019-05-31 NOTE — Telephone Encounter (Signed)
Patient called. He only has 1 pill left.

## 2019-05-31 NOTE — Telephone Encounter (Signed)
We need more time to get these refills done.  He needs to give longer lead time.  Thanks.

## 2019-06-01 NOTE — Telephone Encounter (Signed)
Patient advised.

## 2019-06-06 ENCOUNTER — Ambulatory Visit: Payer: Medicare Other

## 2019-06-14 ENCOUNTER — Ambulatory Visit: Payer: Medicare Other

## 2019-06-21 ENCOUNTER — Ambulatory Visit: Payer: Medicare Other

## 2019-06-26 ENCOUNTER — Telehealth: Payer: Self-pay | Admitting: Family Medicine

## 2019-06-26 NOTE — Telephone Encounter (Signed)
Patient is requesting a call back to discuss his medication  Patient is taking Zoloft at night and also taking Valium at night. He stated he is having trouble sleeping.   C/B # [id number]

## 2019-06-27 NOTE — Telephone Encounter (Signed)
Pt is scheduled for Monday 07/02/19 @ 8am b/c Dr. Damita Dunnings does not have any available 30 min appointments available this week.

## 2019-06-27 NOTE — Telephone Encounter (Signed)
Many thanks. 

## 2019-06-27 NOTE — Telephone Encounter (Signed)
Please call patient and scheduled appointment as instructed. 

## 2019-06-27 NOTE — Telephone Encounter (Signed)
Needs 54min OV, okay to do by phone if needed.  Thanks.

## 2019-06-28 ENCOUNTER — Ambulatory Visit: Payer: Medicare Other

## 2019-06-28 ENCOUNTER — Other Ambulatory Visit: Payer: Medicare Other

## 2019-07-02 ENCOUNTER — Ambulatory Visit (INDEPENDENT_AMBULATORY_CARE_PROVIDER_SITE_OTHER): Payer: Medicare Other | Admitting: Family Medicine

## 2019-07-02 ENCOUNTER — Other Ambulatory Visit: Payer: Self-pay

## 2019-07-02 ENCOUNTER — Encounter: Payer: Self-pay | Admitting: Family Medicine

## 2019-07-02 ENCOUNTER — Telehealth: Payer: Self-pay | Admitting: Family Medicine

## 2019-07-02 DIAGNOSIS — R0602 Shortness of breath: Secondary | ICD-10-CM | POA: Diagnosis not present

## 2019-07-02 DIAGNOSIS — G4700 Insomnia, unspecified: Secondary | ICD-10-CM | POA: Diagnosis not present

## 2019-07-02 MED ORDER — DIAZEPAM 5 MG PO TABS: ORAL_TABLET | ORAL | Status: DC

## 2019-07-02 MED ORDER — TRAZODONE HCL 50 MG PO TABS: 25.0000 mg | ORAL_TABLET | Freq: Every evening | ORAL | 3 refills | Status: DC | PRN

## 2019-07-02 NOTE — Assessment & Plan Note (Signed)
Would add on trazodone at night.  BZD at night isn't helping but doesn't help with AM anxiety so okay to continue that for now.  He'll update me as needed.  Would continue SSRI at this point.

## 2019-07-02 NOTE — Telephone Encounter (Signed)
Please call pt- needs 46min OV in person re: exertional SOB.  Longstanding, isn't acute, needs to be scheduled in near future.  I talked to him about other issues this AM along with this and he doesn't have sx currently.  No CP.  I gave him routine ER cautions.  Thanks.

## 2019-07-02 NOTE — Progress Notes (Signed)
Interactive audio and video telecommunications were attempted between this provider and patient, however failed, due to patient having technical difficulties OR patient did not have access to video capability.  We continued and completed visit with audio only.   Virtual Visit via Telephone Note  I connected with patient on 07/02/19  at 8:12 AM  by telephone and verified that I am speaking with the correct person using two identifiers.  Location of patient:    Location of MD: [facility] Name of referring provider (if blank then none associated): Names per persons and role in encounter:  MD: Earlyne Iba, Patient: name listed above.    I discussed the limitations, risks, security and privacy concerns of performing an evaluation and management service by telephone and the availability of in person appointments. I also discussed with the patient that there may be a patient responsible charge related to this service. The patient expressed understanding and agreed to proceed.  CC: insomnia.   History of Present Illness:   He is taking 1 valium in the AM and 0.5 tabs in the PM.  Still on SSRI.  BZD helps with anxiety in the AM but not with sleep in the PM.  No SI/HI.  Sx going on for 6 months.   He has SOB with exertion, going on for a "pretty good while."  No CP.  Not SOB at baseline/at rest.     Observations/Objective: nad speechwnl  Assessment and Plan: Insomnia.  Would add on trazodone at night.  BZD at night isn't helping but doesn't help with AM anxiety so okay to continue that for now.  He'll update me as needed.  Would continue SSRI at this point.   He has exertional SOB and I'll ask staff to call him about scheduling re: eval.  Routine cautions re: ER given to patient.  No CP and not SOB at time of call.  Longstanding sx, "been going on for awhile" and no sign of acute decompensation.   Follow Up Instructions: see above.    I discussed the assessment and treatment plan with  the patient. The patient was provided an opportunity to ask questions and all were answered. The patient agreed with the plan and demonstrated an understanding of the instructions.   The patient was advised to call back or seek an in-person evaluation if the symptoms worsen or if the condition fails to improve as anticipated.  I provided 18 minutes of non-face-to-face time during this encounter.   Elsie Stain, MD

## 2019-07-02 NOTE — Telephone Encounter (Signed)
Noted. Thanks.

## 2019-07-02 NOTE — Telephone Encounter (Signed)
It was very hard trying to hear the patient with the buzzing in his phone. He was scheduled for a nurse visit on 07/05/19 @ 9am instead of doing the nurse visit he is going to come in at 8:45am and see Dr. Damita Dunnings but will need B12 at the visit. He stated that he didn't have a ride to come in any other time.

## 2019-07-02 NOTE — Assessment & Plan Note (Signed)
He has exertional SOB and I'll ask staff to call him about scheduling re: eval.  Routine cautions re: ER given to patient.  No CP and not SOB at time of call.  Longstanding sx, "been going on for awhile" and no sign of acute decompensation.

## 2019-07-05 ENCOUNTER — Ambulatory Visit (INDEPENDENT_AMBULATORY_CARE_PROVIDER_SITE_OTHER): Payer: Medicare Other | Admitting: Family Medicine

## 2019-07-05 ENCOUNTER — Other Ambulatory Visit: Payer: Self-pay

## 2019-07-05 ENCOUNTER — Ambulatory Visit (INDEPENDENT_AMBULATORY_CARE_PROVIDER_SITE_OTHER)
Admission: RE | Admit: 2019-07-05 | Discharge: 2019-07-05 | Disposition: A | Discharge status: Home / Self Care | Payer: Medicare Other | Source: Ambulatory Visit | Attending: Family Medicine | Admitting: Family Medicine

## 2019-07-05 ENCOUNTER — Encounter: Payer: Self-pay | Admitting: Family Medicine

## 2019-07-05 ENCOUNTER — Ambulatory Visit: Payer: Medicare Other

## 2019-07-05 VITALS — BP 130/70 | HR 77 | Temp 97.2°F | Ht 67.5 in | Wt 192.2 lb

## 2019-07-05 DIAGNOSIS — G4700 Insomnia, unspecified: Secondary | ICD-10-CM

## 2019-07-05 DIAGNOSIS — M25561 Pain in right knee: Secondary | ICD-10-CM

## 2019-07-05 DIAGNOSIS — R0602 Shortness of breath: Secondary | ICD-10-CM | POA: Diagnosis not present

## 2019-07-05 DIAGNOSIS — I10 Essential (primary) hypertension: Secondary | ICD-10-CM

## 2019-07-05 DIAGNOSIS — Z23 Encounter for immunization: Secondary | ICD-10-CM | POA: Diagnosis not present

## 2019-07-05 DIAGNOSIS — M25562 Pain in left knee: Secondary | ICD-10-CM

## 2019-07-05 DIAGNOSIS — E538 Deficiency of other specified B group vitamins: Secondary | ICD-10-CM | POA: Diagnosis not present

## 2019-07-05 LAB — CBC WITH DIFFERENTIAL/PLATELET
Basophils Absolute: 0 10*3/uL (ref 0.0–0.1)
Basophils Relative: 0.4 % (ref 0.0–3.0)
Eosinophils Absolute: 0.1 10*3/uL (ref 0.0–0.7)
Eosinophils Relative: 1.7 % (ref 0.0–5.0)
HCT: 45.3 % (ref 39.0–52.0)
Hemoglobin: 15.2 g/dL (ref 13.0–17.0)
Lymphocytes Relative: 34.4 % (ref 12.0–46.0)
Lymphs Abs: 1.8 10*3/uL (ref 0.7–4.0)
MCHC: 33.6 g/dL (ref 30.0–36.0)
MCV: 92.8 fl (ref 78.0–100.0)
Monocytes Absolute: 0.5 10*3/uL (ref 0.1–1.0)
Monocytes Relative: 9.1 % (ref 3.0–12.0)
Neutro Abs: 2.8 10*3/uL (ref 1.4–7.7)
Neutrophils Relative %: 54.4 % (ref 43.0–77.0)
Platelets: 192 10*3/uL (ref 150.0–400.0)
RBC: 4.88 Mil/uL (ref 4.22–5.81)
RDW: 14.3 % (ref 11.5–15.5)
WBC: 5.2 10*3/uL (ref 4.0–10.5)

## 2019-07-05 LAB — COMPREHENSIVE METABOLIC PANEL
ALT: 16 U/L (ref 0–53)
AST: 21 U/L (ref 0–37)
Albumin: 4.6 g/dL (ref 3.5–5.2)
Alkaline Phosphatase: 65 U/L (ref 39–117)
BUN: 11 mg/dL (ref 6–23)
CO2: 28 mEq/L (ref 19–32)
Calcium: 10.4 mg/dL (ref 8.4–10.5)
Chloride: 101 mEq/L (ref 96–112)
Creatinine, Ser: 0.98 mg/dL (ref 0.40–1.50)
GFR: 91.01 mL/min (ref >60.00)
Glucose, Bld: 82 mg/dL (ref 70–99)
Potassium: 5 mEq/L (ref 3.5–5.1)
Sodium: 136 mEq/L (ref 135–145)
Total Bilirubin: 0.5 mg/dL (ref 0.2–1.2)
Total Protein: 7.6 g/dL (ref 6.0–8.3)

## 2019-07-05 LAB — LIPID PANEL
Cholesterol: 242 mg/dL — ABNORMAL HIGH (ref 0–200)
HDL: 29.7 mg/dL — ABNORMAL LOW (ref >39.00)
NonHDL: 211.95
Total CHOL/HDL Ratio: 8
Triglycerides: 201 mg/dL — ABNORMAL HIGH (ref 0.0–149.0)
VLDL: 40.2 mg/dL — ABNORMAL HIGH (ref 0.0–40.0)

## 2019-07-05 LAB — LDL CHOLESTEROL, DIRECT: Direct LDL: 182 mg/dL

## 2019-07-05 LAB — BRAIN NATRIURETIC PEPTIDE: Pro B Natriuretic peptide (BNP): 27 pg/mL (ref 0.0–100.0)

## 2019-07-05 LAB — TSH: TSH: 1.45 u[IU]/mL (ref 0.35–4.50)

## 2019-07-05 LAB — TROPONIN I (HIGH SENSITIVITY): High Sens Troponin I: 8 ng/L (ref 2–17)

## 2019-07-05 MED ORDER — CYANOCOBALAMIN 1000 MCG/ML IJ SOLN
1000.0000 ug | Freq: Once | INTRAMUSCULAR | Status: AC
  Administered 2019-07-05: 1000 ug via INTRAMUSCULAR

## 2019-07-05 NOTE — Progress Notes (Signed)
Exertional SOB.  Noted with walking over the last few months, gradually getting worse.  No CP, except for 1 episode at rest earlier this week at night, lasted about 10 minutes.  It resolved on its own.  It happened at rest, while in bed.  No syncope.  Smoking about 1 carton a month, down from 2 cartons a month.  He is smoking about 1/2 PPD a day now.  No wheeze.    Insomnia.  He is going to try taking trazodone tonight, he hasn't started it yet.    He is having more trouble walking with knee pain/weakness.  Using a cane at baseline.  R>L pain at baseline.    PMH and SH reviewed  ROS: Per HPI unless specifically indicated in ROS section   Meds, vitals, and allergies reviewed.   GEN: nad, alert and oriented HEENT: ncat NECK: supple w/o LA CV: rrr.  PULM: ctab, no inc wob ABD: soft, +bs EXT: no edema SKIN: well perfused.  R>L knee with crepitus on ROM.

## 2019-07-05 NOTE — Patient Instructions (Addendum)
We'll contact you with your lab report. We'll call about a cardiology appointment.  Don't change your meds for now.  Take care.  Glad to see you.

## 2019-07-06 ENCOUNTER — Encounter: Payer: Self-pay | Admitting: Family Medicine

## 2019-07-06 NOTE — Assessment & Plan Note (Signed)
Very likely to have knee osteoarthritis given the crepitus on range of motion but we need to address his shortness of breath first.  Discussed with patient.  He agrees.

## 2019-07-06 NOTE — Assessment & Plan Note (Addendum)
With history of coronary disease noted.  No acute ST changes on EKG but he does have PACs noted.  Discussed with patient.  Refer to cardiology.  Check labs and chest x-ray today.  Previously statin intolerant.  At this point still okay for outpatient follow-up per routine cautions given.  Encourage smoking cessation. >25 minutes spent in face to face time with patient, >50% spent in counselling or coordination of care.

## 2019-07-06 NOTE — Assessment & Plan Note (Signed)
He is going to try trazodone at night and update me as needed.

## 2019-07-09 DIAGNOSIS — I2510 Atherosclerotic heart disease of native coronary artery without angina pectoris: Secondary | ICD-10-CM | POA: Diagnosis not present

## 2019-07-09 DIAGNOSIS — I10 Essential (primary) hypertension: Secondary | ICD-10-CM | POA: Diagnosis not present

## 2019-07-09 DIAGNOSIS — R0602 Shortness of breath: Secondary | ICD-10-CM | POA: Diagnosis not present

## 2019-07-09 DIAGNOSIS — Z9889 Other specified postprocedural states: Secondary | ICD-10-CM | POA: Diagnosis not present

## 2019-07-10 ENCOUNTER — Other Ambulatory Visit: Payer: Self-pay | Admitting: Family Medicine

## 2019-07-26 DIAGNOSIS — R0602 Shortness of breath: Secondary | ICD-10-CM | POA: Diagnosis not present

## 2019-07-26 DIAGNOSIS — Z9889 Other specified postprocedural states: Secondary | ICD-10-CM | POA: Diagnosis not present

## 2019-07-26 DIAGNOSIS — I10 Essential (primary) hypertension: Secondary | ICD-10-CM | POA: Diagnosis not present

## 2019-07-26 DIAGNOSIS — I2510 Atherosclerotic heart disease of native coronary artery without angina pectoris: Secondary | ICD-10-CM | POA: Diagnosis not present

## 2019-08-01 DIAGNOSIS — I10 Essential (primary) hypertension: Secondary | ICD-10-CM | POA: Diagnosis not present

## 2019-08-01 DIAGNOSIS — Z9889 Other specified postprocedural states: Secondary | ICD-10-CM | POA: Diagnosis not present

## 2019-08-01 DIAGNOSIS — I2510 Atherosclerotic heart disease of native coronary artery without angina pectoris: Secondary | ICD-10-CM | POA: Diagnosis not present

## 2019-08-01 DIAGNOSIS — R0602 Shortness of breath: Secondary | ICD-10-CM | POA: Diagnosis not present

## 2019-08-09 ENCOUNTER — Encounter: Payer: Medicare Other | Admitting: Family Medicine

## 2019-08-09 ENCOUNTER — Ambulatory Visit: Payer: Medicare Other

## 2019-08-15 DIAGNOSIS — Z1159 Encounter for screening for other viral diseases: Secondary | ICD-10-CM | POA: Diagnosis not present

## 2019-08-17 DIAGNOSIS — R0602 Shortness of breath: Secondary | ICD-10-CM | POA: Diagnosis not present

## 2019-10-08 ENCOUNTER — Telehealth: Payer: Self-pay

## 2019-10-08 ENCOUNTER — Other Ambulatory Visit: Payer: Self-pay

## 2019-10-08 NOTE — Telephone Encounter (Signed)
Patient contacted the office and states that he is experiencing bilateral knee pain, and he states that he has been seen for this before and it shows arthritis. Patient states that his legs are in very bad pain and it takes a lot of effort to even get out of bed in the morning. Patient states that he having a difficult living situation right now, and is unable to come in for an appt and would like to know what Dr. Josefine Class recommendations are for pain medicine?

## 2019-10-08 NOTE — Patient Outreach (Signed)
[facility]) Care Management  10/08/2019  Matthew Norton Apr 30, 1946 [id number]   Medication Adherence call to Matthew Norton [facility] Identifiers Verify spoke with patient he is past due on Rosuvastatin 5 mg, patient explain he has stop taking this medication because he was having side effects. [name] is showing past due under [place].   [place] Management Direct Dial [phone]  Fax [phone] [email or web address]

## 2019-10-08 NOTE — Telephone Encounter (Signed)
Pt left v/m with his knee pain worsening when he is up walking and feel like knees are going to give way with pt. I called pt and no answer and no v/m. FYI to Lugene CMA and Yeudiel Mateo LPN.

## 2019-10-09 NOTE — Telephone Encounter (Signed)
Patient says he does not have Meloxicam and doesn't remember ever having it.  Patient would like to try that before going to Ortho.  Is it ok to refill that Rx?

## 2019-10-09 NOTE — Telephone Encounter (Addendum)
I would try to get him in with ortho for their input, especially if tylenol and meloxicam aren't helping.  I can put in referral if needed.  That is likely the best way to go.  Thanks.

## 2019-10-09 NOTE — Telephone Encounter (Signed)
See another phone note dated 10/08/19.

## 2019-10-10 ENCOUNTER — Other Ambulatory Visit: Payer: Self-pay | Admitting: Family Medicine

## 2019-10-10 MED ORDER — MELOXICAM 7.5 MG PO TABS: 7.5000 mg | ORAL_TABLET | Freq: Every day | ORAL | 1 refills | Status: DC

## 2019-10-10 NOTE — Telephone Encounter (Signed)
Would try meloxicam daily with food.  Do not take with ibuprofen or Aleve.  Prescription sent.  Thanks.

## 2019-10-10 NOTE — Addendum Note (Signed)
Addended by: Tonia Ghent on: 10/10/2019 12:01 PM   Modules accepted: Orders

## 2019-10-10 NOTE — Telephone Encounter (Signed)
Spoke to pt. He asked about Tylenol and I advised that would be okay.

## 2019-10-17 ENCOUNTER — Other Ambulatory Visit: Payer: Self-pay

## 2019-10-17 NOTE — Patient Outreach (Signed)
[facility]) Care Management  10/17/2019  Matthew Norton July 17, 1946 [id number]   Medication Adherence call to [name] Hippa Identifiers Verify spoke with patient he is past due on Rosuvastatin 5 mg patient explain he is no longer taking this medication because he was having side effects,he also ask if there was anything over the counter that he can take instead,patient was refer to his doctor to ask what can he take over the counter, [name] is showing past due under [place],  [place] Management Direct Dial [phone]  Fax [phone] [email or web address]

## 2019-10-22 ENCOUNTER — Other Ambulatory Visit: Payer: Self-pay | Admitting: Family Medicine

## 2019-10-22 NOTE — Telephone Encounter (Signed)
LOV 06/01/2019 #90 with 1 refill.  LOV 07/05/2019 and no future appointments

## 2019-10-23 NOTE — Telephone Encounter (Signed)
Sent. Thanks.   

## 2019-11-15 ENCOUNTER — Other Ambulatory Visit: Payer: Self-pay | Admitting: Family Medicine

## 2019-12-26 DIAGNOSIS — J019 Acute sinusitis, unspecified: Secondary | ICD-10-CM | POA: Diagnosis not present

## 2019-12-26 DIAGNOSIS — I2510 Atherosclerotic heart disease of native coronary artery without angina pectoris: Secondary | ICD-10-CM | POA: Diagnosis not present

## 2019-12-26 DIAGNOSIS — Z9889 Other specified postprocedural states: Secondary | ICD-10-CM | POA: Diagnosis not present

## 2019-12-26 DIAGNOSIS — R519 Headache, unspecified: Secondary | ICD-10-CM | POA: Diagnosis not present

## 2019-12-26 DIAGNOSIS — I214 Non-ST elevation (NSTEMI) myocardial infarction: Secondary | ICD-10-CM | POA: Diagnosis not present

## 2019-12-26 DIAGNOSIS — Z03818 Encounter for observation for suspected exposure to other biological agents ruled out: Secondary | ICD-10-CM | POA: Diagnosis not present

## 2019-12-26 DIAGNOSIS — B9689 Other specified bacterial agents as the cause of diseases classified elsewhere: Secondary | ICD-10-CM | POA: Diagnosis not present

## 2019-12-26 DIAGNOSIS — R0602 Shortness of breath: Secondary | ICD-10-CM | POA: Diagnosis not present

## 2019-12-26 DIAGNOSIS — I10 Essential (primary) hypertension: Secondary | ICD-10-CM | POA: Diagnosis not present

## 2019-12-26 DIAGNOSIS — R509 Fever, unspecified: Secondary | ICD-10-CM | POA: Diagnosis not present

## 2020-01-08 ENCOUNTER — Ambulatory Visit: Payer: Medicare Other

## 2020-03-26 ENCOUNTER — Other Ambulatory Visit: Payer: Self-pay | Admitting: Family Medicine

## 2020-03-27 ENCOUNTER — Telehealth: Payer: Self-pay | Admitting: Family Medicine

## 2020-03-27 NOTE — Telephone Encounter (Signed)
Electronic refill request. Diazepam Last office visit:   07/05/2019 Last Filled:     90 tablet 1 10/23/2019  Please advise.

## 2020-03-27 NOTE — Telephone Encounter (Signed)
Called patient and got him scheduled for CPE and labs. 

## 2020-03-28 NOTE — Telephone Encounter (Signed)
Sent. Thanks.  Has f/u scheduled.

## 2020-03-28 NOTE — Telephone Encounter (Signed)
Noted. Thanks.

## 2020-04-29 ENCOUNTER — Other Ambulatory Visit: Payer: Self-pay

## 2020-04-29 ENCOUNTER — Encounter: Payer: Self-pay | Admitting: Family Medicine

## 2020-04-29 ENCOUNTER — Ambulatory Visit (INDEPENDENT_AMBULATORY_CARE_PROVIDER_SITE_OTHER): Payer: Medicare Other | Admitting: Family Medicine

## 2020-04-29 VITALS — BP 118/64 | HR 75 | Temp 96.3°F | Ht 67.5 in | Wt 192.3 lb

## 2020-04-29 DIAGNOSIS — R0602 Shortness of breath: Secondary | ICD-10-CM | POA: Diagnosis not present

## 2020-04-29 DIAGNOSIS — F32A Depression, unspecified: Secondary | ICD-10-CM

## 2020-04-29 DIAGNOSIS — I2510 Atherosclerotic heart disease of native coronary artery without angina pectoris: Secondary | ICD-10-CM | POA: Diagnosis not present

## 2020-04-29 DIAGNOSIS — G720 Drug-induced myopathy: Secondary | ICD-10-CM | POA: Diagnosis not present

## 2020-04-29 DIAGNOSIS — E538 Deficiency of other specified B group vitamins: Secondary | ICD-10-CM

## 2020-04-29 DIAGNOSIS — M2550 Pain in unspecified joint: Secondary | ICD-10-CM | POA: Diagnosis not present

## 2020-04-29 DIAGNOSIS — I10 Essential (primary) hypertension: Secondary | ICD-10-CM | POA: Diagnosis not present

## 2020-04-29 DIAGNOSIS — F329 Major depressive disorder, single episode, unspecified: Secondary | ICD-10-CM

## 2020-04-29 DIAGNOSIS — F419 Anxiety disorder, unspecified: Secondary | ICD-10-CM

## 2020-04-29 DIAGNOSIS — T466X5A Adverse effect of antihyperlipidemic and antiarteriosclerotic drugs, initial encounter: Secondary | ICD-10-CM

## 2020-04-29 LAB — LIPID PANEL
Cholesterol: 232 mg/dL — ABNORMAL HIGH (ref 0–200)
HDL: 32 mg/dL — ABNORMAL LOW (ref >39.00)
LDL Cholesterol: 161 mg/dL — ABNORMAL HIGH (ref 0–99)
NonHDL: 199.58
Total CHOL/HDL Ratio: 7
Triglycerides: 192 mg/dL — ABNORMAL HIGH (ref 0.0–149.0)
VLDL: 38.4 mg/dL (ref 0.0–40.0)

## 2020-04-29 LAB — CBC WITH DIFFERENTIAL/PLATELET
Basophils Absolute: 0 10*3/uL (ref 0.0–0.1)
Basophils Relative: 0.3 % (ref 0.0–3.0)
Eosinophils Absolute: 0.1 10*3/uL (ref 0.0–0.7)
Eosinophils Relative: 1.2 % (ref 0.0–5.0)
HCT: 45.2 % (ref 39.0–52.0)
Hemoglobin: 15.7 g/dL (ref 13.0–17.0)
Lymphocytes Relative: 38 % (ref 12.0–46.0)
Lymphs Abs: 2.1 10*3/uL (ref 0.7–4.0)
MCHC: 34.8 g/dL (ref 30.0–36.0)
MCV: 92.3 fl (ref 78.0–100.0)
Monocytes Absolute: 0.3 10*3/uL (ref 0.1–1.0)
Monocytes Relative: 6 % (ref 3.0–12.0)
Neutro Abs: 3 10*3/uL (ref 1.4–7.7)
Neutrophils Relative %: 54.5 % (ref 43.0–77.0)
Platelets: 192 10*3/uL (ref 150.0–400.0)
RBC: 4.9 Mil/uL (ref 4.22–5.81)
RDW: 13.2 % (ref 11.5–15.5)
WBC: 5.5 10*3/uL (ref 4.0–10.5)

## 2020-04-29 LAB — COMPREHENSIVE METABOLIC PANEL
ALT: 16 U/L (ref 0–53)
AST: 16 U/L (ref 0–37)
Albumin: 4.5 g/dL (ref 3.5–5.2)
Alkaline Phosphatase: 76 U/L (ref 39–117)
BUN: 12 mg/dL (ref 6–23)
CO2: 28 mEq/L (ref 19–32)
Calcium: 10.2 mg/dL (ref 8.4–10.5)
Chloride: 101 mEq/L (ref 96–112)
Creatinine, Ser: 1.14 mg/dL (ref 0.40–1.50)
GFR: 76.26 mL/min (ref >60.00)
Glucose, Bld: 84 mg/dL (ref 70–99)
Potassium: 4.7 mEq/L (ref 3.5–5.1)
Sodium: 137 mEq/L (ref 135–145)
Total Bilirubin: 0.5 mg/dL (ref 0.2–1.2)
Total Protein: 7.8 g/dL (ref 6.0–8.3)

## 2020-04-29 LAB — VITAMIN B12: Vitamin B-12: 147 pg/mL — ABNORMAL LOW (ref 211–911)

## 2020-04-29 LAB — TSH: TSH: 1.54 u[IU]/mL (ref 0.35–4.50)

## 2020-04-29 MED ORDER — FLUTICASONE PROPIONATE 50 MCG/ACT NA SUSP
2.0000 | Freq: Every day | NASAL | Status: AC
Start: 2020-04-29 — End: ?

## 2020-04-29 MED ORDER — CETIRIZINE HCL 10 MG PO TABS: 10.0000 mg | ORAL_TABLET | Freq: Every day | ORAL | Status: DC

## 2020-04-29 MED ORDER — SERTRALINE HCL 100 MG PO TABS: ORAL_TABLET | ORAL | Status: DC

## 2020-04-29 MED ORDER — CYANOCOBALAMIN 1000 MCG/ML IJ SOLN
1000.0000 ug | Freq: Once | INTRAMUSCULAR | Status: AC
  Administered 2020-04-29: 1000 ug via INTRAMUSCULAR

## 2020-04-29 MED ORDER — LOSARTAN POTASSIUM 25 MG PO TABS: 25.0000 mg | ORAL_TABLET | Freq: Every day | ORAL | Status: DC

## 2020-04-29 MED ORDER — DIAZEPAM 5 MG PO TABS: ORAL_TABLET | ORAL | Status: DC

## 2020-04-29 MED ORDER — MELOXICAM 7.5 MG PO TABS
7.5000 mg | ORAL_TABLET | Freq: Every day | ORAL | Status: DC
Start: 2020-04-29 — End: 2020-05-28

## 2020-04-29 NOTE — Patient Instructions (Addendum)
Go to the lab on the way out.   If you have mychart we'll likely use that to update you.    B12 shot today.  We'll see about options after I get your labs back.  Try using the nicotine gum in the meantime.  Take care.  Glad to see you.  Please talk to cardiology about PSK9 medicines (since you had trouble with statins) when you go back.    Try restarting sertraline/zoloft in the mornings.  Start with a 1/2 tab and work up to 1 tab in about 2 weeks.

## 2020-04-29 NOTE — Progress Notes (Signed)
This visit occurred during the SARS-CoV-2 public health emergency.  Safety protocols were in place, including screening questions prior to the visit, additional usage of staff PPE, and extensive cleaning of exam room while observing appropriate contact time as indicated for disinfecting solutions.  Discussed statin use.  Prev myalgias with mult meds.    Covid vaccination encouraged.  Discussed with patient.  Hypertension/CAD.    Using medication without problems or lightheadedness: yes Chest pain with exertion:no Edema:no Short of breath: some at baseline.  "I've always been short of breath" but others noted it seemed to be better with cutting back on smoking.    Still on meloxicam at baseline for joint pain.  More pain in joints with weather changes.    Mood d/w pt.  Had been off sertraline since it made insomnia worse.  Had not been taking razodone at night.  Taking prn BZD, d/w pt.  No ADE on med.  He is waking from nightmares.    He has cut back on smoking, down to <1PPD.  D/w pt about tapering.  D/w pt about trying gum.    B12 def.  Off replacement recently.  D/w pt about restarting.  He has some neuropathy sx with pins and needles in the arms and legs.    Meds, vitals, and allergies reviewed.   PMH and SH reviewed  ROS: Per HPI unless specifically indicated in ROS section   GEN: nad, alert and oriented HEENT: ncat NECK: supple w/o LA CV: rrr. PULM: ctab, no inc wob ABD: soft, +bs EXT: no edema SKIN: no acute rash  See notes on labs.

## 2020-05-01 ENCOUNTER — Other Ambulatory Visit: Payer: Self-pay | Admitting: Family Medicine

## 2020-05-01 DIAGNOSIS — E538 Deficiency of other specified B group vitamins: Secondary | ICD-10-CM

## 2020-05-01 DIAGNOSIS — G720 Drug-induced myopathy: Secondary | ICD-10-CM | POA: Insufficient documentation

## 2020-05-01 NOTE — Assessment & Plan Note (Signed)
Not on statin to cause current symptoms. Still on meloxicam at baseline for joint pain.  More pain in joints with weather changes.  Would continue meloxicam with routine cautions for now.

## 2020-05-01 NOTE — Assessment & Plan Note (Signed)
Had been off sertraline since it made insomnia worse.  Had not been taking razodone at night.  Taking prn BZD, d/w pt.  No ADE on med.  He is waking from nightmares.  Advised to try restarting sertraline/zoloft in the mornings.  Start with a 1/2 tab and work up to 1 tab in about 2 weeks.  He will update me as needed.  Continue as needed benzodiazepine for now.  Still okay for outpatient follow-up.

## 2020-05-01 NOTE — Assessment & Plan Note (Signed)
Reasonable control.  Continue losartan.  Labs discussed with patient.  He agrees.

## 2020-05-01 NOTE — Assessment & Plan Note (Signed)
Seemingly improved with tapering tobacco use.  Encouraged continued taper/cessation.

## 2020-05-01 NOTE — Assessment & Plan Note (Signed)
Discussed statin use.  Prev myalgias with mult meds.  Advised him to check with cardiology about PSK 9 inhibitor use.

## 2020-05-01 NOTE — Assessment & Plan Note (Signed)
Off replacement recently.  D/w pt about restarting.  He has some neuropathy sx with pins and needles in the arms and legs.

## 2020-05-15 ENCOUNTER — Telehealth: Payer: Self-pay

## 2020-05-15 ENCOUNTER — Ambulatory Visit (INDEPENDENT_AMBULATORY_CARE_PROVIDER_SITE_OTHER): Payer: Medicare Other

## 2020-05-15 ENCOUNTER — Ambulatory Visit: Payer: Medicare Other

## 2020-05-15 ENCOUNTER — Other Ambulatory Visit: Payer: Self-pay

## 2020-05-15 DIAGNOSIS — E538 Deficiency of other specified B group vitamins: Secondary | ICD-10-CM | POA: Diagnosis not present

## 2020-05-15 MED ORDER — CYANOCOBALAMIN 1000 MCG/ML IJ SOLN
1000.0000 ug | Freq: Once | INTRAMUSCULAR | Status: AC
  Administered 2020-05-15: 1000 ug via INTRAMUSCULAR

## 2020-05-15 NOTE — Progress Notes (Signed)
Per orders of Dr. Danise Mina, in Dr. Josefine Class absence, 2nd weekly injection of B12 given by Loreen Freud, into left deltoid (pt's request).  Patient tolerated injection well.

## 2020-05-15 NOTE — Telephone Encounter (Signed)
[place] Night - Client Nonclinical Telephone Record AccessNurse Client [facility] Night - Client Client Site [facility] Physician Renford Dills - MD Contact Type Call Who Is Calling Patient / Member / Family / Caregiver Caller Name Tracker Mance Caller Phone Number [phone] Patient Name Matthew Norton Patient DOB [birth year] Call Type Message Only Information Provided Reason for Call Request for General Office Information Initial Comment Caller has a 9am appt for a B12 shot and will be running a little late because his transportation said they will be running late -- he said only by 10-15 minutes Disp. Time Disposition Final User 05/15/2020 7:56:02 AM General Information Provided Yes Kenton Kingfisher, Lanette Call Closed By: Nelia Shi Transaction Date/Time: 05/15/2020 7:53:10 AM (ET)

## 2020-05-15 NOTE — Telephone Encounter (Signed)
Per chart review tab pt has already received the B 12 injection today.

## 2020-05-22 ENCOUNTER — Other Ambulatory Visit: Payer: Self-pay

## 2020-05-22 ENCOUNTER — Ambulatory Visit (INDEPENDENT_AMBULATORY_CARE_PROVIDER_SITE_OTHER): Payer: Medicare Other

## 2020-05-22 DIAGNOSIS — E538 Deficiency of other specified B group vitamins: Secondary | ICD-10-CM

## 2020-05-22 DIAGNOSIS — R7989 Other specified abnormal findings of blood chemistry: Secondary | ICD-10-CM

## 2020-05-22 MED ORDER — CYANOCOBALAMIN 1000 MCG/ML IJ SOLN
1000.0000 ug | Freq: Once | INTRAMUSCULAR | Status: AC
  Administered 2020-05-22: 1000 ug via INTRAMUSCULAR

## 2020-05-22 NOTE — Progress Notes (Signed)
Per orders of Dr. Duncan, injection of vit B12 given by Tavi Gaughran. Patient tolerated injection well.  

## 2020-05-27 ENCOUNTER — Other Ambulatory Visit: Payer: Self-pay | Admitting: Family Medicine

## 2020-05-27 NOTE — Telephone Encounter (Signed)
Electronic refill request. Diazepam Last office visit:   04/29/2020 Last Filled:   04/29/2020   Electronic refill request. Meloxicam Last office visit:    04/29/2020 Last Filled:   04/29/2020  Please advise.

## 2020-05-28 NOTE — Telephone Encounter (Signed)
Sent. Thanks.   

## 2020-05-29 ENCOUNTER — Ambulatory Visit (INDEPENDENT_AMBULATORY_CARE_PROVIDER_SITE_OTHER): Payer: Medicare Other

## 2020-05-29 ENCOUNTER — Other Ambulatory Visit: Payer: Self-pay

## 2020-05-29 DIAGNOSIS — E538 Deficiency of other specified B group vitamins: Secondary | ICD-10-CM | POA: Diagnosis not present

## 2020-05-29 MED ORDER — CYANOCOBALAMIN 1000 MCG/ML IJ SOLN
1000.0000 ug | Freq: Once | INTRAMUSCULAR | Status: AC
  Administered 2020-05-29: 1000 ug via INTRAMUSCULAR

## 2020-05-29 NOTE — Progress Notes (Signed)
Per orders of Dr. Duncan, injection of vit B12 given by Zonya Gudger. Patient tolerated injection well.  

## 2020-06-19 ENCOUNTER — Ambulatory Visit (INDEPENDENT_AMBULATORY_CARE_PROVIDER_SITE_OTHER): Payer: Medicare Other

## 2020-06-19 ENCOUNTER — Other Ambulatory Visit: Payer: Self-pay

## 2020-06-19 DIAGNOSIS — E538 Deficiency of other specified B group vitamins: Secondary | ICD-10-CM

## 2020-06-19 DIAGNOSIS — R7989 Other specified abnormal findings of blood chemistry: Secondary | ICD-10-CM

## 2020-06-19 MED ORDER — CYANOCOBALAMIN 1000 MCG/ML IJ SOLN
1000.0000 ug | Freq: Once | INTRAMUSCULAR | Status: AC
  Administered 2020-06-19: 1000 ug via INTRAMUSCULAR

## 2020-06-19 NOTE — Progress Notes (Signed)
Per orders of Dr. Damita Dunnings, injection of B12 given, into the right deltoid, by Loreen Freud. Patient tolerated injection well.

## 2020-07-03 ENCOUNTER — Other Ambulatory Visit: Payer: Self-pay

## 2020-07-03 ENCOUNTER — Ambulatory Visit (INDEPENDENT_AMBULATORY_CARE_PROVIDER_SITE_OTHER): Payer: Medicare Other

## 2020-07-03 DIAGNOSIS — E538 Deficiency of other specified B group vitamins: Secondary | ICD-10-CM | POA: Diagnosis not present

## 2020-07-03 MED ORDER — CYANOCOBALAMIN 1000 MCG/ML IJ SOLN
1000.0000 ug | Freq: Once | INTRAMUSCULAR | Status: AC
  Administered 2020-07-03: 1000 ug via INTRAMUSCULAR

## 2020-07-03 NOTE — Progress Notes (Signed)
Per orders of Dr. Damita Dunnings, injection of B12 given by Randall An. Injection given in L. Deltoid. Pt will be on once monthly B12 injections now.  Patient tolerated injection well.

## 2020-07-28 DIAGNOSIS — I214 Non-ST elevation (NSTEMI) myocardial infarction: Secondary | ICD-10-CM | POA: Diagnosis not present

## 2020-07-28 DIAGNOSIS — I872 Venous insufficiency (chronic) (peripheral): Secondary | ICD-10-CM | POA: Diagnosis not present

## 2020-07-28 DIAGNOSIS — I10 Essential (primary) hypertension: Secondary | ICD-10-CM | POA: Diagnosis not present

## 2020-07-28 DIAGNOSIS — I2510 Atherosclerotic heart disease of native coronary artery without angina pectoris: Secondary | ICD-10-CM | POA: Diagnosis not present

## 2020-07-28 DIAGNOSIS — Z23 Encounter for immunization: Secondary | ICD-10-CM | POA: Diagnosis not present

## 2020-07-30 ENCOUNTER — Ambulatory Visit (INDEPENDENT_AMBULATORY_CARE_PROVIDER_SITE_OTHER): Payer: Medicare Other

## 2020-07-30 ENCOUNTER — Other Ambulatory Visit: Payer: Self-pay

## 2020-07-30 DIAGNOSIS — E538 Deficiency of other specified B group vitamins: Secondary | ICD-10-CM

## 2020-07-30 MED ORDER — CYANOCOBALAMIN 1000 MCG/ML IJ SOLN
1000.0000 ug | Freq: Once | INTRAMUSCULAR | Status: AC
  Administered 2020-07-30: 1000 ug via INTRAMUSCULAR

## 2020-07-30 NOTE — Progress Notes (Signed)
Per orders of Dr. Danise Mina in Dr. Josefine Class absence, injection of B12 in R. deltoid given by Randall An. Patient tolerated injection well.

## 2020-08-16 ENCOUNTER — Other Ambulatory Visit: Payer: Self-pay | Admitting: Family Medicine

## 2020-08-16 DIAGNOSIS — E538 Deficiency of other specified B group vitamins: Secondary | ICD-10-CM

## 2020-08-16 MED ORDER — CYANOCOBALAMIN 1000 MCG/ML IJ SOLN: INTRAMUSCULAR | Status: AC

## 2020-09-29 ENCOUNTER — Other Ambulatory Visit: Payer: Self-pay | Admitting: Family Medicine

## 2020-10-06 ENCOUNTER — Telehealth: Payer: Self-pay

## 2020-10-06 MED ORDER — LOSARTAN POTASSIUM 50 MG PO TABS
25.0000 mg | ORAL_TABLET | Freq: Every day | ORAL | 1 refills | Status: DC
Start: 2020-10-06 — End: 2022-02-19

## 2020-10-06 NOTE — Telephone Encounter (Signed)
Sent. Thanks.  Make sure patient knows to cut the pills in half.

## 2020-10-06 NOTE — Telephone Encounter (Signed)
Pharmacy is requesting 50mg  of Losartan be sent in bc 25mg  is on back order.  Please advise

## 2020-10-07 ENCOUNTER — Telehealth: Payer: Self-pay

## 2020-10-07 NOTE — Telephone Encounter (Signed)
Spoke with pt to let him know that Dr. Para March sent in Losartan 50mg  instead of the 25mg  bc the pharmacy did not have it on hand. He will need to cut the 50mg  in half.  Thank you,  

## 2020-11-28 ENCOUNTER — Other Ambulatory Visit: Payer: Self-pay | Admitting: Family Medicine

## 2020-11-28 ENCOUNTER — Other Ambulatory Visit: Payer: Self-pay

## 2020-11-28 MED ORDER — DIAZEPAM 5 MG PO TABS
ORAL_TABLET | ORAL | 2 refills | Status: DC
Start: 2020-11-28 — End: 2021-05-05

## 2020-11-28 NOTE — Telephone Encounter (Signed)
See other request

## 2020-11-28 NOTE — Telephone Encounter (Signed)
Refill request for Valium 5 mg tablets  LOV - 04/29/20 NOV - not scheduled Last refilled - 05/28/20 #90/2

## 2020-11-28 NOTE — Telephone Encounter (Signed)
Sent. Thanks.   This is the first time I've seen this request.

## 2020-11-28 NOTE — Telephone Encounter (Signed)
Electronic refill request. Diazepam Last office visit:   04/29/2020 Last Filled:   05/28/2020 #90 with 2 refills   Pt reports he is out of medication currently as the pharmacy states they have sent over request I do not see in chart  Please advise.

## 2020-12-31 ENCOUNTER — Other Ambulatory Visit: Payer: Self-pay | Admitting: Family Medicine

## 2020-12-31 NOTE — Telephone Encounter (Signed)
Pharmacy requests refill on: Meloxicam 7.5 mg   LAST REFILL: 05/28/2020 (Q-30, R-2) LAST OV: 04/29/2020 NEXT OV: Not Scheduled  PHARMACY: [id number] [place], [place]

## 2021-03-27 ENCOUNTER — Other Ambulatory Visit: Payer: Self-pay | Admitting: Family Medicine

## 2021-04-22 ENCOUNTER — Other Ambulatory Visit: Payer: Self-pay | Admitting: Family Medicine

## 2021-04-23 ENCOUNTER — Other Ambulatory Visit: Payer: Self-pay

## 2021-05-04 ENCOUNTER — Telehealth: Payer: Self-pay | Admitting: Family Medicine

## 2021-05-04 DIAGNOSIS — E538 Deficiency of other specified B group vitamins: Secondary | ICD-10-CM

## 2021-05-04 DIAGNOSIS — I10 Essential (primary) hypertension: Secondary | ICD-10-CM

## 2021-05-04 NOTE — Telephone Encounter (Signed)
Refill request for diazepam 5 mg tablets  LOV - 07/30/20 Next OV - not scheduled Last refill - 11/28/20 #90/2

## 2021-05-05 NOTE — Telephone Encounter (Signed)
Patient needs AWV scheduled after 10/20 and labs beforehand.

## 2021-05-05 NOTE — Telephone Encounter (Signed)
Spoke with patient scheduled [place] with labs prior

## 2021-05-05 NOTE — Telephone Encounter (Signed)
Sent. Thanks.  Needs yearly visit scheduled.  Needs labs ahead of time.

## 2021-06-16 ENCOUNTER — Other Ambulatory Visit: Payer: Self-pay | Admitting: Family Medicine

## 2021-06-22 NOTE — Telephone Encounter (Signed)
Pt called to check status on this. States he is completely out and needs his refills. Please advise.

## 2021-06-23 ENCOUNTER — Other Ambulatory Visit: Payer: Self-pay | Admitting: Family Medicine

## 2021-06-23 NOTE — Telephone Encounter (Signed)
Last OV - 04/29/2020 Next OV - 08/06/2021 Last Filled - Valium -05/05/2021

## 2021-06-23 NOTE — Telephone Encounter (Signed)
Sent. Thanks.   

## 2021-07-04 ENCOUNTER — Other Ambulatory Visit: Payer: Self-pay | Admitting: Family Medicine

## 2021-07-06 NOTE — Telephone Encounter (Signed)
Refill request for Mobic 7.5 mg tablets  LOV - 07/30/20 Next OV - not scheduled Last refill - 04/23/21 #30/0  Patient also requesting refill on losartan which was already done 06/23/21. I was trying to refuse this but couldn't.

## 2021-07-07 NOTE — Telephone Encounter (Signed)
Sent. Thanks.   

## 2021-07-29 ENCOUNTER — Other Ambulatory Visit: Payer: Self-pay | Admitting: Family Medicine

## 2021-07-29 NOTE — Telephone Encounter (Signed)
Refill request for diazepam (VALIUM) 5 MG tablet  LOV - 04/29/20 Next OV - not scheduled Last refill - 06/23/21 #90/0

## 2021-07-30 NOTE — Telephone Encounter (Signed)
Sent. Thanks.  Needs yearly OV with labs at/before.

## 2021-07-31 NOTE — Telephone Encounter (Signed)
Pt scheduled for cpe/labs on 11.28.22

## 2021-08-04 ENCOUNTER — Other Ambulatory Visit: Payer: Medicare Other

## 2021-08-05 NOTE — Progress Notes (Addendum)
Subjective:   Matthew Norton is a 75 y.o. male who presents for Medicare Annual/Subsequent preventive examination.  I connected with Miller Limehouse today by telephone and verified that I am speaking with the correct person using two identifiers. Location patient: home Location provider: work Persons participating in the virtual visit: patient, Marine scientist.    I discussed the limitations, risks, security and privacy concerns of performing an evaluation and management service by telephone and the availability of in person appointments. I also discussed with the patient that there may be a patient responsible charge related to this service. The patient expressed understanding and verbally consented to this telephonic visit.    Interactive audio and video telecommunications were attempted between this provider and patient, however failed, due to patient having technical difficulties OR patient did not have access to video capability.  We continued and completed visit with audio only.  Some vital signs may be absent or patient reported.   Time Spent with patient on telephone encounter: 60 minutes   Review of Systems     Cardiac Risk Factors include: advanced age (>47men, >43 women);dyslipidemia;hypertension     Objective:    Today's Vitals   08/06/21 1317 08/06/21 1318  Weight: 192 lb (87.1 kg)   Height: 5\' 7"  (1.702 m)   PainSc:  6    Body mass index is 30.07 kg/m.  Advanced Directives 08/06/2021 08/03/2018 08/25/2017 06/17/2017 03/31/2017 04/29/2016 03/18/2015  Does Patient Have a Medical Advance Directive? No No No No No No No  Would patient like information on creating a medical advance directive? Yes (MAU/Ambulatory/Procedural Areas - Information given) No - Patient declined No - Patient declined No - Patient declined - Yes - Educational materials given No - patient declined information    Current Medications (verified) Outpatient Encounter Medications as of 08/06/2021  Medication Sig    acetaminophen (TYLENOL) 500 MG tablet Take 2 tablets (1,000 mg total) by mouth every 8 (eight) hours as needed (for pain).   aspirin 81 MG tablet Take 81 mg by mouth daily.   cetirizine (ZYRTEC) 10 MG tablet Take 1 tablet (10 mg total) by mouth daily. For allergies   diazepam (VALIUM) 5 MG tablet TAKE 1/2 TO 1 (ONE-HALF TO ONE) TABLET BY MOUTH IN THE MORNING AND 1 TO 2 TABLETS NIGHTLY MAX  3  TABLETS  PER  DAY   fluticasone (FLONASE) 50 MCG/ACT nasal spray Place 2 sprays into both nostrils daily. For allergies   losartan (COZAAR) 25 MG tablet Take 1 tablet by mouth once daily   losartan (COZAAR) 50 MG tablet Take 0.5 tablets (25 mg total) by mouth daily.   meloxicam (MOBIC) 7.5 MG tablet TAKE 1 TABLET BY MOUTH ONCE DAILY (NEEDS  APPOINTMENT  FOR  FURTHER  REFILLS)   nitroGLYCERIN (NITROSTAT) 0.4 MG SL tablet Place 1 tablet (0.4 mg total) under the tongue every 5 (five) minutes as needed for chest pain. Max 3 doses in 15 minutes.   cyanocobalamin (,VITAMIN B-12,) 1000 MCG/ML injection 1000 mcg IM every 14 days (Patient not taking: Reported on 08/06/2021)   sertraline (ZOLOFT) 100 MG tablet Take in the morning.  1/2 tab for 2 weeks then 1 tab a day. For anxiety. (Patient not taking: Reported on 08/06/2021)   No facility-administered encounter medications on file as of 08/06/2021.    Allergies (verified) Bupropion, Hydrochlorothiazide, Paroxetine hcl, Venlafaxine, Flexeril [cyclobenzaprine], Gabapentin, Hydroxyzine, Lipitor [atorvastatin], and Pravastatin   History: Past Medical History:  Diagnosis Date   Anxiety  takes Valium daily as needed   Arthritis    Cataracts, bilateral    Complication of anesthesia    slow to wake up after one surgery   Coronary artery disease    Depression    Hyperlipemia    has tried meds but not on any bc of joint aches and pains   Hypertension    takes Metoprolol and Losartan daily   Insomnia    Joint pain    Peripheral edema    Shortness of breath     with exertion    Weakness    numbness in both hands and feet.   Past Surgical History:  Procedure Laterality Date   ANTERIOR CERVICAL DECOMP/DISCECTOMY FUSION N/A 03/24/2015   Procedure: Cervical four- five, anterior cervical decompression with fusion, plating and bonegraft;  Surgeon: Jovita Gamma, MD;  Location: [facility];  Service: Neurosurgery;  Laterality: N/A;  C45 anterior cervical decompression with fusion plating and bonegraft   BACK SURGERY  2001   lumb lam   CARDIAC CATHETERIZATION  08,11   stents x2   [place]   fx rt    COLONOSCOPY WITH PROPOFOL N/A 03/31/2017   Procedure: COLONOSCOPY WITH PROPOFOL;  Surgeon: Jonathon Bellows, MD;  Location: [facility];  Service: Endoscopy;  Laterality: N/A;   COLONOSCOPY WITH PROPOFOL N/A 08/25/2017   Procedure: COLONOSCOPY WITH PROPOFOL;  Surgeon: Jonathon Bellows, MD;  Location: [facility] ENDOSCOPY;  Service: Endoscopy;  Laterality: N/A;   CORONARY ANGIOPLASTY     2 stents   ESOPHAGOGASTRODUODENOSCOPY     HERNIA REPAIR  2007   rt and lt ing    NAILBED REPAIR Left 09/13/2013   Procedure: LEFT THUMB NAIL REMOVAL, NAIL BED RECONSTRUCTION PARTIAL EXCISION DISTAL PHALANX WITH COMPLEX RECONSTRUCTION ;  Surgeon: Roseanne Kaufman, MD;  Location: [facility];  Service: Orthopedics;  Laterality: Left;   parotid mass removed     REPLANTATION THUMB  2003   left   Family History  Problem Relation Age of Onset   Hypertension Mother    Cancer Mother    Cancer Father    Colon cancer Father    Prostate cancer Father    Social History   Socioeconomic History   Marital status: Single    Spouse name: Not on file   Number of children: Not on file   Years of education: Not on file   Highest education level: Not on file  Occupational History   Not on file  Tobacco Use   Smoking status: Every Day    Packs/day: 1.00    Years: 50.00    Pack years: 50.00    Types: Cigarettes   Smokeless tobacco: Never  Vaping Use   Vaping  Use: Never used  Substance and Sexual Activity   Alcohol use: No    Alcohol/week: 0.0 standard drinks   Drug use: No   Sexual activity: Never  Other Topics Concern   Not on file  Social History Narrative   2 kids   In relationship with long term girlfriend   Social Determinants of Health   Financial Resource Strain: High Risk   Difficulty of Paying Living Expenses: Very hard  Food Insecurity: No Food Insecurity   Worried About Charity fundraiser in the Last Year: Never true   Ran Out of Food in the Last Year: Never true  Transportation Needs: No Transportation Needs   Lack of Transportation (Medical): No   Lack of Transportation (Non-Medical): No  Physical Activity:  Inactive   Days of Exercise per Week: 0 days   Minutes of Exercise per Session: 0 min  Stress: No Stress Concern Present   Feeling of Stress : Not at all  Social Connections: Socially Isolated   Frequency of Communication with Friends and Family: More than three times a week   Frequency of Social Gatherings with Friends and Family: Twice a week   Attends Religious Services: Never   Marine scientist or Organizations: No   Attends Music therapist: Never   Marital Status: Never married    Tobacco Counseling Ready to quit: Not Answered Counseling given: Not Answered   Clinical Intake:  Pre-visit preparation completed: Yes  Pain : 0-10 Pain Score: 6  Pain Type: Neuropathic pain Pain Location: Shoulder Pain Orientation: Right, Left Pain Descriptors / Indicators: Pins and needles, Tingling     BMI - recorded: 30.07 Nutritional Status: BMI > 30  Obese Nutritional Risks: None Diabetes: No  How often do you need to have someone help you when you read instructions, pamphlets, or other written materials from your doctor or pharmacy?: 1 - Never  Diabetic?No  Interpreter Needed?: No  Information entered by :: Orrin Brigham LPN   Activities of Daily Living In your present state of  health, do you have any difficulty performing the following activities: 08/06/2021  Hearing? N  Comment Patient states no issues  Vision? N  Difficulty concentrating or making decisions? N  Walking or climbing stairs? Y  Comment uses cane  Dressing or bathing? Y  Comment knees hurt sitting on shower chair, patient states that he can wash up independently  Doing errands, shopping? Y  Comment Has someone that takes him shopping and to doctors appointments  Preparing Food and eating ? Y  Comment Issues preparing, friend brings meals  Using the Toilet? N  In the past six months, have you accidently leaked urine? Y  Comment leakage  Do you have problems with loss of bowel control? Y  Managing your Medications? N  Managing your Finances? Y  Comment patient states kerosene is expensive for heating  Housekeeping or managing your Housekeeping? Y  Comment has assistance with house and yard work  Some recent data might be hidden    Patient Care Team: Tonia Ghent, MD as PCP - General (Family Medicine) Joan Flores Richard Miu, DO as Referring Physician (Optometry) Isaias Cowman, MD as Consulting Physician (Cardiology)  Indicate any recent Medical Services you may have received from other than [facility] providers in the past year (date may be approximate).     Assessment:   This is a routine wellness examination for [place].  Hearing/Vision screen Hearing Screening - Comments:: Patient states right ear has issues  per hearing test. Patient states he can hear very well.  Vision Screening - Comments:: Last eye exam1 1 1/2 years ago. Plans to make an appointment, patient wears glasses.   Dietary issues and exercise activities discussed: Current Exercise Habits: The patient does not participate in regular exercise at present, Exercise limited by: cardiac condition(s)   Goals Addressed             This Visit's Progress    Patient Stated       Would like to walk again and be more independent.         Depression Screen PHQ 2/9 Scores 08/06/2021 08/03/2018 06/17/2017 04/29/2016 04/29/2016  PHQ - 2 Score 0 3 4 0 0  PHQ- 9 Score - 3 11 - -  Fall Risk Fall Risk  08/06/2021 08/03/2018 06/17/2017 04/29/2016 04/29/2016  Falls in the past year? 1 No Yes No No  Comment - - 7-8 falls over the previous 12 mths due to back or knees "giving away" - -  Number falls in past yr: 1 - 2 or more - -  Comment issues with balance - - - -  Injury with Fall? 0 - Yes - -  Risk Factor Category  - - High Fall Risk - -  Risk for fall due to : Impaired balance/gait - Impaired balance/gait;Impaired mobility - -  Follow up Falls prevention discussed - - - -    FALL RISK PREVENTION PERTAINING TO THE HOME:  Any stairs in or around the home? No  If so, are there any without handrails?  N/A Home free of loose throw rugs in walkways, pet beds, electrical cords, etc? Yes  Adequate lighting in your home to reduce risk of falls? Yes   ASSISTIVE DEVICES UTILIZED TO PREVENT FALLS:  Life alert? No  Use of a cane, walker or w/c? Yes  Grab bars in the bathroom? No  Shower chair or bench in shower? Yes  Elevated toilet seat or a handicapped toilet? No   TIMED UP AND GO:  Was the test performed? No  Visit completed over the phone.    Cognitive Function: Normal cognitive status assessed by this Nurse Health Advisor. No abnormalities found.   MMSE - Mini Mental State Exam 08/03/2018 06/17/2017  Orientation to time 5 5  Orientation to Place 5 5  Registration 3 3  Attention/ Calculation 0 0  Recall 3 2  Recall-comments - pt was unable to recall 1 of 3 words  Language- name 2 objects 0 0  Language- repeat 1 1  Language- follow 3 step command 3 3  Language- read & follow direction 0 0  Write a sentence 0 0  Copy design 0 0  Total score 20 19        Immunizations Immunization History  Administered Date(s) Administered   Fluad Quad(high Dose 65+) 07/05/2019   Influenza Split 07/18/2014    Influenza,inj,Quad PF,6+ Mos 06/17/2017, 08/03/2018   Pneumococcal Conjugate-13 04/29/2016   Pneumococcal Polysaccharide-23 06/17/2017   Zoster, Live 08/09/2013    TDAP status: Due, Education has been provided regarding the importance of this vaccine. Advised may receive this vaccine at local pharmacy or Health Dept. Aware to provide a copy of the vaccination record if obtained from local pharmacy or Health Dept. Verbalized acceptance and understanding.  Flu Vaccine status: Due, Education has been provided regarding the importance of this vaccine. Advised may receive this vaccine at local pharmacy or Health Dept. Aware to provide a copy of the vaccination record if obtained from local pharmacy or Health Dept. Verbalized acceptance and understanding.  Pneumococcal vaccine status: Up to date  Covid-19 vaccine status: Declined, Education has been provided regarding the importance of this vaccine but patient still declined. Advised may receive this vaccine at local pharmacy or Health Dept.or vaccine clinic. Aware to provide a copy of the vaccination record if obtained from local pharmacy or Health Dept. Verbalized acceptance and understanding.  Qualifies for Shingles Vaccine? Yes   Zostavax completed Yes   Shingrix Completed?: No.    Education has been provided regarding the importance of this vaccine. Patient has been advised to call insurance company to determine out of pocket expense if they have not yet received this vaccine. Advised may also receive vaccine at local pharmacy or Health  Dept. Verbalized acceptance and understanding.  Screening Tests Health Maintenance  Topic Date Due   COVID-19 Vaccine (1) Never done   Zoster Vaccines- Shingrix (1 of 2) Never done   TETANUS/TDAP  10/11/2020   INFLUENZA VACCINE  05/11/2021   COLONOSCOPY (Pts 45-56yrs Insurance coverage will need to be confirmed)  08/26/2027   Pneumonia Vaccine 1+ Years old  Completed   Hepatitis C Screening  Completed    HPV VACCINES  Aged Out    Health Maintenance  Health Maintenance Due  Topic Date Due   COVID-19 Vaccine (1) Never done   Zoster Vaccines- Shingrix (1 of 2) Never done   TETANUS/TDAP  10/11/2020   INFLUENZA VACCINE  05/11/2021    Colorectal Cancer screening: Due, last colonoscopy 08/25/17, reported to repeat in 6 months. Patient plans to discuss with PCP  Lung Cancer Screening: (Low Dose CT Chest recommended if Age 3-80 years, 30 pack-year currently smoking OR have quit w/in 15years.) does qualify. Will discuss with PCP  Lung Cancer Screening Referral: will discuss with PCP  Additional Screening:  Hepatitis C Screening: Completed 06/24/15  Vision Screening: Recommended annual ophthalmology exams for early detection of glaucoma and other disorders of the eye. Is the patient up to date with their annual eye exam?  No  Who is the provider or what is the name of the office in which the patient attends annual eye exams? Information unavailable during telephone visit.   Dental Screening: Recommended annual dental exams for proper oral hygiene  Community Resource Referral / Chronic Care Management: CRR required this visit?  Yes   CCM required this visit?  No      Plan:     I have personally reviewed and noted the following in the patient's chart:   Medical and social history Use of alcohol, tobacco or illicit drugs  Current medications and supplements including opioid prescriptions. Patient is not currently taking opioid prescriptions. Functional ability and status Nutritional status Physical activity Advanced directives List of other physicians Hospitalizations, surgeries, and ER visits in previous 12 months Vitals Screenings to include cognitive, depression, and falls Referrals and appointments  In addition, I have reviewed and discussed with patient certain preventive protocols, quality metrics, and best practice recommendations. A written personalized care plan for  preventive services as well as general preventive health recommendations were provided to patient.  Due to this being a telephonic visit, the after visit summary with patients personalized plan was offered to patient via mail or my-chart. Patient preferred to pick up at office at next visit.      Loma Messing, LPN   [id number]   Nurse Health Advisor  Nurse Notes: None  I reviewed health advisor's note, was available for consultation on the day of service listed in this note, and agree with documentation and plan. Elsie Stain, MD.

## 2021-08-06 ENCOUNTER — Ambulatory Visit (INDEPENDENT_AMBULATORY_CARE_PROVIDER_SITE_OTHER): Payer: Medicare Other

## 2021-08-06 VITALS — Ht 67.0 in | Wt 192.0 lb

## 2021-08-06 DIAGNOSIS — Z0000 Encounter for general adult medical examination without abnormal findings: Secondary | ICD-10-CM | POA: Diagnosis not present

## 2021-08-06 NOTE — Patient Instructions (Addendum)
[name] , Thank you for taking time to complete your Medicare Wellness Visit. I appreciate your ongoing commitment to your health goals. Please review the following plan we discussed and let me know if I can assist you in the future.   Screening recommendations/referrals: Colonoscopy: Due, last completed 08/25/17 Declined today, please discuss with PCP at your next visit.  Recommended yearly ophthalmology/optometry visit for glaucoma screening and checkup Recommended yearly dental visit for hygiene and checkup  Vaccinations: Influenza vaccine: Due-May obtain vaccine at our office or your local pharmacy.  Pneumococcal vaccine: up to date Tdap vaccine: Due-May obtain vaccine at your local pharmacy.  Shingles vaccine: Due-May obtain vaccine at your local pharmacy.    Covid-19: Discuss with pharmacy if you change your mind  Advanced directives: you may obtain information at your next visit  Conditions/risks identified: see problem list  Next appointment: Follow up in one year for your annual wellness visit.   Preventive Care 2 Years and Older, Male Preventive care refers to lifestyle choices and visits with your health care provider that can promote health and wellness. What does preventive care include? A yearly physical exam. This is also called an annual well check. Dental exams once or twice a year. Routine eye exams. Ask your health care provider how often you should have your eyes checked. Personal lifestyle choices, including: Daily care of your teeth and gums. Regular physical activity. Eating a healthy diet. Avoiding tobacco and drug use. Limiting alcohol use. Practicing safe sex. Taking low doses of aspirin every day. Taking vitamin and mineral supplements as recommended by your health care provider. What happens during an annual well check? The services and screenings done by your health care provider during your annual well check will depend on your age, overall  health, lifestyle risk factors, and family history of disease. Counseling  Your health care provider may ask you questions about your: Alcohol use. Tobacco use. Drug use. Emotional well-being. Home and relationship well-being. Sexual activity. Eating habits. History of falls. Memory and ability to understand (cognition). Work and work Statistician. Screening  You may have the following tests or measurements: Height, weight, and BMI. Blood pressure. Lipid and cholesterol levels. These may be checked every 5 years, or more frequently if you are over 49 years old. Skin check. Lung cancer screening. You may have this screening every year starting at age 27 if you have a 30-pack-year history of smoking and currently smoke or have quit within the past 15 years. Fecal occult blood test (FOBT) of the stool. You may have this test every year starting at age 66. Flexible sigmoidoscopy or colonoscopy. You may have a sigmoidoscopy every 5 years or a colonoscopy every 10 years starting at age 40. Prostate cancer screening. Recommendations will vary depending on your family history and other risks. Hepatitis C blood test. Hepatitis B blood test. Sexually transmitted disease (STD) testing. Diabetes screening. This is done by checking your blood sugar (glucose) after you have not eaten for a while (fasting). You may have this done every 1-3 years. Abdominal aortic aneurysm (AAA) screening. You may need this if you are a current or former smoker. Osteoporosis. You may be screened starting at age 37 if you are at high risk. Talk with your health care provider about your test results, treatment options, and if necessary, the need for more tests. Vaccines  Your health care provider may recommend certain vaccines, such as: Influenza vaccine. This is recommended every year. Tetanus, diphtheria, and acellular pertussis (Tdap,  Td) vaccine. You may need a Td booster every 10 years. Zoster vaccine. You may  need this after age 87. Pneumococcal 13-valent conjugate (PCV13) vaccine. One dose is recommended after age 64. Pneumococcal polysaccharide (PPSV23) vaccine. One dose is recommended after age 43. Talk to your health care provider about which screenings and vaccines you need and how often you need them. This information is not intended to replace advice given to you by your health care provider. Make sure you discuss any questions you have with your health care provider. Document Released: 10/24/2015 Document Revised: 06/16/2016 Document Reviewed: 07/29/2015 Elsevier Interactive Patient Education  2017 [place] Prevention in the Home Falls can cause injuries. They can happen to people of all ages. There are many things you can do to make your home safe and to help prevent falls. What can I do on the outside of my home? Regularly fix the edges of walkways and driveways and fix any cracks. Remove anything that might make you trip as you walk through a door, such as a raised step or threshold. Trim any bushes or trees on the path to your home. Use bright outdoor lighting. Clear any walking paths of anything that might make someone trip, such as rocks or tools. Regularly check to see if handrails are loose or broken. Make sure that both sides of any steps have handrails. Any raised decks and porches should have guardrails on the edges. Have any leaves, snow, or ice cleared regularly. Use sand or salt on walking paths during winter. Clean up any spills in your garage right away. This includes oil or grease spills. What can I do in the bathroom? Use night lights. Install grab bars by the toilet and in the tub and shower. Do not use towel bars as grab bars. Use non-skid mats or decals in the tub or shower. If you need to sit down in the shower, use a plastic, non-slip stool. Keep the floor dry. Clean up any water that spills on the floor as soon as it happens. Remove soap buildup in  the tub or shower regularly. Attach bath mats securely with double-sided non-slip rug tape. Do not have throw rugs and other things on the floor that can make you trip. What can I do in the bedroom? Use night lights. Make sure that you have a light by your bed that is easy to reach. Do not use any sheets or blankets that are too big for your bed. They should not hang down onto the floor. Have a firm chair that has side arms. You can use this for support while you get dressed. Do not have throw rugs and other things on the floor that can make you trip. What can I do in the kitchen? Clean up any spills right away. Avoid walking on wet floors. Keep items that you use a lot in easy-to-reach places. If you need to reach something above you, use a strong step stool that has a grab bar. Keep electrical cords out of the way. Do not use floor polish or wax that makes floors slippery. If you must use wax, use non-skid floor wax. Do not have throw rugs and other things on the floor that can make you trip. What can I do with my stairs? Do not leave any items on the stairs. Make sure that there are handrails on both sides of the stairs and use them. Fix handrails that are broken or loose. Make sure that handrails are as  long as the stairways. Check any carpeting to make sure that it is firmly attached to the stairs. Fix any carpet that is loose or worn. Avoid having throw rugs at the top or bottom of the stairs. If you do have throw rugs, attach them to the floor with carpet tape. Make sure that you have a light switch at the top of the stairs and the bottom of the stairs. If you do not have them, ask someone to add them for you. What else can I do to help prevent falls? Wear shoes that: Do not have high heels. Have rubber bottoms. Are comfortable and fit you well. Are closed at the toe. Do not wear sandals. If you use a stepladder: Make sure that it is fully opened. Do not climb a closed  stepladder. Make sure that both sides of the stepladder are locked into place. Ask someone to hold it for you, if possible. Clearly mark and make sure that you can see: Any grab bars or handrails. First and last steps. Where the edge of each step is. Use tools that help you move around (mobility aids) if they are needed. These include: Canes. Walkers. Scooters. Crutches. Turn on the lights when you go into a dark area. Replace any light bulbs as soon as they burn out. Set up your furniture so you have a clear path. Avoid moving your furniture around. If any of your floors are uneven, fix them. If there are any pets around you, be aware of where they are. Review your medicines with your doctor. Some medicines can make you feel dizzy. This can increase your chance of falling. Ask your doctor what other things that you can do to help prevent falls. This information is not intended to replace advice given to you by your health care provider. Make sure you discuss any questions you have with your health care provider. Document Released: 07/24/2009 Document Revised: 03/04/2016 Document Reviewed: 11/01/2014 Elsevier Interactive Patient Education  2017 [employer].

## 2021-08-11 ENCOUNTER — Encounter: Payer: Medicare Other | Admitting: Family Medicine

## 2021-08-14 ENCOUNTER — Telehealth: Payer: Self-pay

## 2021-08-14 NOTE — Telephone Encounter (Signed)
   Telephone encounter was:  Unsuccessful.  08/14/2021 Name: RUDELL ORTMAN MRN: [id number] DOB: 04-08-1946  Unsuccessful outbound call made today to assist with:  Financial Difficulties related to heating cost.  Outreach Attempt:  1st Attempt  A HIPAA compliant voice message was left requesting a return call.  Instructed patient to call back at [phone].  Maurico Perrell, AAS Paralegal, [place]  [address], [place] [address] ??[email or web address]  ?? [phone]   [email or web address]

## 2021-08-27 ENCOUNTER — Telehealth: Payer: Self-pay

## 2021-08-27 NOTE — Telephone Encounter (Signed)
   Telephone encounter was:  Successful.  08/27/2021 Name: Matthew Norton MRN: [id number] DOB: 1946/07/19  [name] is a 75 y.o. year old male who is a primary care patient of Tonia Ghent, MD . The community resource team was consulted for assistance with Financial Difficulties related to heating cost.  Care guide performed the following interventions: Spoke with patient about [facility]. Confirmed patient's home address mailed partially filled out application and information on where to mail the form once completed.   Follow Up Plan:  Care guide will follow up with patient by phone over the next 7-10 days. Shironda Kain, AAS Paralegal, [facility]  [address], [place] [address] ??[email or web address]  ?? [phone]   [email or web address]

## 2021-09-01 ENCOUNTER — Telehealth: Payer: Self-pay

## 2021-09-01 NOTE — Telephone Encounter (Signed)
   Telephone encounter was:  Successful.  09/01/2021 Name: Matthew Norton MRN: [id number] DOB: 04-18-1946  Matthew Norton is a 75 y.o. year old male who is a primary care patient of Tonia Ghent, MD . The community resource team was consulted for assistance with Financial Difficulties related to utilities  Care guide performed the following interventions: Spoke with patient he has not received Theme park manager.    Follow Up Plan:  Care guide will follow up with patient by phone over the next 1-30 days  Matthew Norton, AAS Paralegal, [place] [facility]  [address], [place] [address] ??[email or web address]  ?? [phone]   [email or web address]

## 2021-09-07 ENCOUNTER — Encounter: Payer: Medicare Other | Admitting: Family Medicine

## 2021-09-08 ENCOUNTER — Telehealth: Payer: Self-pay

## 2021-09-08 NOTE — Telephone Encounter (Signed)
   Telephone encounter was:  Successful.  09/08/2021 Name: Matthew Norton MRN: [id number] DOB: 08-02-1946  Matthew Norton is a 75 y.o. year old male who is a primary care patient of Tonia Ghent, MD . The community resource team was consulted for assistance with Financial Difficulties related to utilities.  Care guide performed the following interventions: Spoke with patient he has received the resource letter and has completed and mailed in the Energy Assistance application.  Follow Up Plan:  No further follow up planned at this time. The patient has been provided with needed resources.  Sharon Stapel, AAS Paralegal, [facility]  [address], [place] [address] ??[email or web address]  ?? [phone]   [email or web address]

## 2021-09-11 ENCOUNTER — Telehealth: Payer: Self-pay

## 2021-09-11 NOTE — Telephone Encounter (Signed)
   Telephone encounter was:  Unsuccessful.  09/11/2021 Name: Matthew Norton MRN: [id number] DOB: 1946-04-14  Unsuccessful outbound call made today to assist with:  Financial Difficulties related to utilities  Outreach Attempt:  3rd Attempt.  Referral closed unable to contact patient.Left message on voicemail to let patient know he mailed his energy assistance application to the [facility] office in error. Per [facility] the application will be mailed out on Monday.   A HIPAA compliant voice message was left requesting a return call.  Instructed patient to call back at [phone].  Coretha Creswell, AAS Paralegal, [place] [facility]  [address], [place] [address] ??[email or web address]  ?? [phone]   [email or web address]

## 2021-09-14 ENCOUNTER — Other Ambulatory Visit: Payer: Self-pay | Admitting: Family Medicine

## 2021-09-15 NOTE — Telephone Encounter (Signed)
Refill request for Valium 5 mg tablets  LOV - 04/29/20 Next OV - 10/08/21 Last refill - 07/30/21 #90/0  *Also needs Losartan refilled

## 2021-09-16 NOTE — Telephone Encounter (Signed)
Sent. Thanks.   

## 2021-10-01 ENCOUNTER — Other Ambulatory Visit: Payer: Self-pay | Admitting: Family Medicine

## 2021-10-08 ENCOUNTER — Encounter: Payer: Medicare Other | Admitting: Family Medicine

## 2021-10-21 ENCOUNTER — Other Ambulatory Visit: Payer: Self-pay | Admitting: Family Medicine

## 2021-10-21 NOTE — Telephone Encounter (Signed)
Refill request for diazepam (VALIUM) 5 MG tablet  LOV - 04/29/20 Next OV - 12/07/21 Last refill - 09/16/21 #90/0

## 2021-12-07 ENCOUNTER — Encounter: Payer: Medicare Other | Admitting: Family Medicine

## 2021-12-07 ENCOUNTER — Other Ambulatory Visit: Payer: Self-pay | Admitting: Family Medicine

## 2021-12-07 NOTE — Telephone Encounter (Signed)
Refill request for diazepam (VALIUM) 5 MG tablet  LOV - 04/29/20 Next OV - 01/15/22 Last refill - 10/21/21 #90/0

## 2021-12-09 NOTE — Telephone Encounter (Signed)
Sent. Thanks.   

## 2021-12-30 ENCOUNTER — Other Ambulatory Visit: Payer: Self-pay | Admitting: Family Medicine

## 2022-01-15 ENCOUNTER — Encounter: Payer: Medicare Other | Admitting: Family Medicine

## 2022-01-25 ENCOUNTER — Other Ambulatory Visit: Payer: Self-pay | Admitting: Family Medicine

## 2022-01-25 NOTE — Telephone Encounter (Signed)
Last filled 12-09-21 #90 ?Last OV 07-30-20 ?Next OV 02-04-22 ?Matthew Norton

## 2022-01-26 NOTE — Telephone Encounter (Signed)
Sent. Thanks.   

## 2022-02-04 ENCOUNTER — Encounter: Payer: Medicare Other | Admitting: Family Medicine

## 2022-02-19 ENCOUNTER — Encounter: Payer: Self-pay | Admitting: Family Medicine

## 2022-02-19 ENCOUNTER — Ambulatory Visit (INDEPENDENT_AMBULATORY_CARE_PROVIDER_SITE_OTHER): Payer: Medicare Other | Admitting: Family Medicine

## 2022-02-19 VITALS — BP 140/80 | HR 72 | Temp 97.5°F | Ht 67.0 in | Wt 193.0 lb

## 2022-02-19 DIAGNOSIS — E538 Deficiency of other specified B group vitamins: Secondary | ICD-10-CM | POA: Diagnosis not present

## 2022-02-19 DIAGNOSIS — N138 Other obstructive and reflux uropathy: Secondary | ICD-10-CM | POA: Diagnosis not present

## 2022-02-19 DIAGNOSIS — Z7189 Other specified counseling: Secondary | ICD-10-CM

## 2022-02-19 DIAGNOSIS — I2510 Atherosclerotic heart disease of native coronary artery without angina pectoris: Secondary | ICD-10-CM

## 2022-02-19 DIAGNOSIS — Z125 Encounter for screening for malignant neoplasm of prostate: Secondary | ICD-10-CM | POA: Diagnosis not present

## 2022-02-19 DIAGNOSIS — I10 Essential (primary) hypertension: Secondary | ICD-10-CM

## 2022-02-19 DIAGNOSIS — T466X5A Adverse effect of antihyperlipidemic and antiarteriosclerotic drugs, initial encounter: Secondary | ICD-10-CM

## 2022-02-19 DIAGNOSIS — Z1211 Encounter for screening for malignant neoplasm of colon: Secondary | ICD-10-CM

## 2022-02-19 DIAGNOSIS — R350 Frequency of micturition: Secondary | ICD-10-CM | POA: Diagnosis not present

## 2022-02-19 DIAGNOSIS — G720 Drug-induced myopathy: Secondary | ICD-10-CM

## 2022-02-19 DIAGNOSIS — F419 Anxiety disorder, unspecified: Secondary | ICD-10-CM

## 2022-02-19 DIAGNOSIS — Z0000 Encounter for general adult medical examination without abnormal findings: Secondary | ICD-10-CM

## 2022-02-19 DIAGNOSIS — R7989 Other specified abnormal findings of blood chemistry: Secondary | ICD-10-CM

## 2022-02-19 DIAGNOSIS — N401 Enlarged prostate with lower urinary tract symptoms: Secondary | ICD-10-CM | POA: Diagnosis not present

## 2022-02-19 DIAGNOSIS — M25569 Pain in unspecified knee: Secondary | ICD-10-CM

## 2022-02-19 LAB — POCT URINALYSIS DIPSTICK
Bilirubin, UA: NEGATIVE
Blood, UA: NEGATIVE
Glucose, UA: NEGATIVE
Ketones, UA: NEGATIVE
Leukocytes, UA: NEGATIVE
Nitrite, UA: NEGATIVE
Protein, UA: NEGATIVE
Spec Grav, UA: 1.01 (ref 1.010–1.025)
Urobilinogen, UA: 0.2 E.U./dL
pH, UA: 6 (ref 5.0–8.0)

## 2022-02-19 LAB — PSA, MEDICARE: PSA: 0.26 ng/ml (ref 0.10–4.00)

## 2022-02-19 LAB — LIPID PANEL
Cholesterol: 216 mg/dL — ABNORMAL HIGH (ref 0–200)
HDL: 31.2 mg/dL — ABNORMAL LOW (ref >39.00)
LDL Cholesterol: 167 mg/dL — ABNORMAL HIGH (ref 0–99)
NonHDL: 185.16
Total CHOL/HDL Ratio: 7
Triglycerides: 90 mg/dL (ref 0.0–149.0)
VLDL: 18 mg/dL (ref 0.0–40.0)

## 2022-02-19 LAB — CBC WITH DIFFERENTIAL/PLATELET
Basophils Absolute: 0 10*3/uL (ref 0.0–0.1)
Basophils Relative: 0.5 % (ref 0.0–3.0)
Eosinophils Absolute: 0.1 10*3/uL (ref 0.0–0.7)
Eosinophils Relative: 1.6 % (ref 0.0–5.0)
HCT: 44.3 % (ref 39.0–52.0)
Hemoglobin: 15.2 g/dL (ref 13.0–17.0)
Lymphocytes Relative: 35.2 % (ref 12.0–46.0)
Lymphs Abs: 1.7 10*3/uL (ref 0.7–4.0)
MCHC: 34.3 g/dL (ref 30.0–36.0)
MCV: 96 fl (ref 78.0–100.0)
Monocytes Absolute: 0.3 10*3/uL (ref 0.1–1.0)
Monocytes Relative: 6.2 % (ref 3.0–12.0)
Neutro Abs: 2.7 10*3/uL (ref 1.4–7.7)
Neutrophils Relative %: 56.5 % (ref 43.0–77.0)
Platelets: 184 10*3/uL (ref 150.0–400.0)
RBC: 4.61 Mil/uL (ref 4.22–5.81)
RDW: 13.5 % (ref 11.5–15.5)
WBC: 4.9 10*3/uL (ref 4.0–10.5)

## 2022-02-19 LAB — COMPREHENSIVE METABOLIC PANEL
ALT: 17 U/L (ref 0–53)
AST: 16 U/L (ref 0–37)
Albumin: 4.5 g/dL (ref 3.5–5.2)
Alkaline Phosphatase: 66 U/L (ref 39–117)
BUN: 10 mg/dL (ref 6–23)
CO2: 28 mEq/L (ref 19–32)
Calcium: 9.9 mg/dL (ref 8.4–10.5)
Chloride: 103 mEq/L (ref 96–112)
Creatinine, Ser: 1.19 mg/dL (ref 0.40–1.50)
GFR: 60.19 mL/min (ref >60.00)
Glucose, Bld: 82 mg/dL (ref 70–99)
Potassium: 4.6 mEq/L (ref 3.5–5.1)
Sodium: 138 mEq/L (ref 135–145)
Total Bilirubin: 0.6 mg/dL (ref 0.2–1.2)
Total Protein: 7.5 g/dL (ref 6.0–8.3)

## 2022-02-19 LAB — VITAMIN B12: Vitamin B-12: 123 pg/mL — ABNORMAL LOW (ref 211–911)

## 2022-02-19 LAB — TSH: TSH: 1.16 u[IU]/mL (ref 0.35–5.50)

## 2022-02-19 MED ORDER — NITROGLYCERIN 0.4 MG SL SUBL: 0.4000 mg | SUBLINGUAL_TABLET | SUBLINGUAL | 3 refills | Status: DC | PRN

## 2022-02-19 MED ORDER — LOSARTAN POTASSIUM 25 MG PO TABS: 25.0000 mg | ORAL_TABLET | Freq: Every day | ORAL | 3 refills | Status: DC

## 2022-02-19 MED ORDER — CYANOCOBALAMIN 1000 MCG/ML IJ SOLN
1000.0000 ug | Freq: Once | INTRAMUSCULAR | Status: AC
  Administered 2022-02-19: 1000 ug via INTRAMUSCULAR

## 2022-02-19 NOTE — Progress Notes (Signed)
Walking with cane.  Parking form done at [employer].   Joint pain.  Meloxicam didn't help.  Knee pain.  Shoulder pain.   Burning pain, unclear if from B12 def. due for B12 dose today.  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  Reviewed with patient at office visit. ?  ?Hypertension:    ?Using medication without problems or lightheadedness:  yes ?Chest pain with exertion:no ?Edema: mild BLE edema.   ?Short of breath: no ? ?Anxiety ie "my nerves" d/w pt.  Off sertraline.  Nightmares are better off sertraline but he still has some.  Used valium prn. No SI/HI.   ? ?H/o B12 def.  Labs pending.  No recent B12 injection.  ? ?CAD. S/p stent x2.  D/w pt about smoking cessation.  Dw pt about trying nicotine gum.  Routine cautions d/w pt.  Statin intolerant.   ? ?Urinary frequency.  D/w pt about checking u/a and psa. See notes on labs . ? ?Zostavax prev done.  ?PNA prev done.  ?Covid vaccine prev done.   ?Flu shot dw pt  ?Tetanus d/w pt.   ?PSA pending.   ?Colonoscopy referral placed 2023.   ?Living will d/w pt.  If patient were incapacitated, then would have [employer].  ? ?He has had altered taste and smell since previous COVID vaccination.  Discussed with patient.  No acute changes. ? ?Meds, vitals, and allergies reviewed.  ? ?ROS: Per HPI unless specifically indicated in ROS section  ? ?GEN: nad, alert and oriented ?HEENT: ncat ?NECK: supple w/o LA ?CV: rrr.  ?PULM: ctab, no inc wob ?ABD: soft, +bs ?EXT: no edema ?SKIN: no acute rash ?Chronic knee joint line changes noted at baseline.   ?

## 2022-02-19 NOTE — Patient Instructions (Signed)
Go to the lab on the way out.   If you have mychart we'll likely use that to update you.    ?B12 shot today, plan on repeat in 2 weeks.  ?We'll call about seeing ortho and GI.  ?Take care.  Glad to see you. ?Let me see about med options in the meantime for anxiety and urination.   ?

## 2022-02-21 ENCOUNTER — Other Ambulatory Visit: Payer: Self-pay | Admitting: Family Medicine

## 2022-02-21 DIAGNOSIS — M25569 Pain in unspecified knee: Secondary | ICD-10-CM | POA: Insufficient documentation

## 2022-02-21 MED ORDER — TAMSULOSIN HCL 0.4 MG PO CAPS: 0.4000 mg | ORAL_CAPSULE | Freq: Every day | ORAL | 3 refills | Status: DC

## 2022-02-21 NOTE — Assessment & Plan Note (Signed)
Intolerant of sertraline.  I told him I wanted to consider his options.  At this point I think it is appropriate for him to see psychiatry.  Referral placed. ?

## 2022-02-21 NOTE — Assessment & Plan Note (Signed)
Refer to orthopedics 

## 2022-02-21 NOTE — Assessment & Plan Note (Signed)
Zostavax prev done.  ?PNA prev done.  ?Covid vaccine prev done.   ?Flu shot dw pt  ?Tetanus d/w pt.   ?PSA pending.   ?Colonoscopy referral placed 2023.   ?Living will d/w pt.  If patient were incapacitated, then would have [employer].  ?

## 2022-02-21 NOTE — Assessment & Plan Note (Signed)
B12 dose done at office visit.  See notes on labs. ?

## 2022-02-21 NOTE — Assessment & Plan Note (Signed)
Living will d/w pt.  If patient were incapacitated, then would have [employer].  ?

## 2022-02-21 NOTE — Assessment & Plan Note (Signed)
S/p stent x2.  D/w pt about smoking cessation.  Dw pt about trying nicotine gum.  Routine cautions d/w pt.  Statin intolerant.   Not having exertional chest pain.  Per cardiology. ?

## 2022-02-21 NOTE — Assessment & Plan Note (Signed)
History of.  See notes on labs. 

## 2022-02-21 NOTE — Assessment & Plan Note (Signed)
Statin intolerant 

## 2022-02-21 NOTE — Assessment & Plan Note (Signed)
Continue losartan and aspirin.  See notes on labs. ?

## 2022-02-22 ENCOUNTER — Other Ambulatory Visit: Payer: Self-pay

## 2022-02-22 ENCOUNTER — Telehealth: Payer: Self-pay

## 2022-02-22 DIAGNOSIS — Z86010 Personal history of colonic polyps: Secondary | ICD-10-CM

## 2022-02-22 MED ORDER — NA SULFATE-K SULFATE-MG SULF 17.5-3.13-1.6 GM/177ML PO SOLN: 1.0000 | Freq: Once | ORAL | 0 refills | Status: AC

## 2022-02-22 NOTE — Telephone Encounter (Signed)
Gastroenterology Pre-Procedure Review ? ?Request Date: 03/22/22 ?Requesting Physician: Dr. Vicente Males ? ?PATIENT REVIEW QUESTIONS: The patient responded to the following health history questions as indicated:   ? ?1. Are you having any GI issues? no ?2. Do you have a personal history of Polyps? yes (08/25/17 colonoscopy performed by Dr. Vicente Males) ?3. Do you have a family history of Colon Cancer or Polyps? no ?4. Diabetes Mellitus? no ?5. Joint replacements in the past 12 months?no ?6. Major health problems in the past 3 months?no ?7. Any artificial heart valves, MVP, or defibrillator? Pt has a stent.  His cardiologist is Dr. Saralyn Pilar.  Cardiac request will be sent to his office. ?   ?MEDICATIONS & ALLERGIES:    ?Patient reports the following regarding taking any anticoagulation/antiplatelet therapy:   ?Plavix, Coumadin, Eliquis, Xarelto, Lovenox, Pradaxa, Brilinta, or Effient? no ?Aspirin? yes (81 mg daily) ? ?Patient confirms/reports the following medications:  ?Current Outpatient Medications  ?Medication Sig Dispense Refill  ? acetaminophen (TYLENOL) 500 MG tablet Take 2 tablets (1,000 mg total) by mouth every 8 (eight) hours as needed (for pain).    ? aspirin 81 MG tablet Take 81 mg by mouth daily.    ? cetirizine (ZYRTEC) 10 MG tablet Take 1 tablet (10 mg total) by mouth daily. For allergies    ? cyanocobalamin (,VITAMIN B-12,) 1000 MCG/ML injection 1000 mcg IM every 14 days 1 mL   ? diazepam (VALIUM) 5 MG tablet TAKE 1/2 TO 1 (ONE-HALF TO ONE) TABLET BY MOUTH IN THE MORNING AND 1 TO 2 NIGHTLY MAX  3  TABLETS  PER  DAY 90 tablet 0  ? fluticasone (FLONASE) 50 MCG/ACT nasal spray Place 2 sprays into both nostrils daily. For allergies    ? losartan (COZAAR) 25 MG tablet Take 1 tablet (25 mg total) by mouth daily. 90 tablet 3  ? meloxicam (MOBIC) 7.5 MG tablet TAKE 1 TABLET BY MOUTH ONCE DAILY (NEEDS  APPOINTMENT  FOR  FURTHER  REFILLS) 90 tablet 0  ? nitroGLYCERIN (NITROSTAT) 0.4 MG SL tablet Place 1 tablet (0.4 mg total)  under the tongue every 5 (five) minutes as needed for chest pain. Max 3 doses in 15 minutes. 25 tablet 3  ? tamsulosin (FLOMAX) 0.4 MG CAPS capsule Take 1 capsule (0.4 mg total) by mouth daily. 30 capsule 3  ? ?No current facility-administered medications for this visit.  ? ? ?Patient confirms/reports the following allergies:  ?Allergies  ?Allergen Reactions  ? Bupropion Other (See Comments)  ? Hydrochlorothiazide Other (See Comments)  ?  Unspecified  ? Paroxetine Hcl Other (See Comments)  ?  Unspecified  ? Venlafaxine Other (See Comments)  ?  Unspecified  ? Flexeril [Cyclobenzaprine] Other (See Comments)  ?  Likely sedation reaction  ? Gabapentin Other (See Comments)  ?  Headache,weakness  ? Hydroxyzine Other (See Comments)  ?  Lack of effect for anxiety.   ? Lipitor [Atorvastatin] Other (See Comments)  ?  aches  ? Pravastatin Other (See Comments)  ?  nightmares  ? ? ?No orders of the defined types were placed in this encounter. ? ? ?AUTHORIZATION INFORMATION ?Primary Insurance: ?1D#: ?Group #: ? ?Secondary Insurance: ?1D#: ?Group #: ? ?SCHEDULE INFORMATION: ?Date: 03/22/22 ?Time: ?Location: [facility] ?

## 2022-03-02 ENCOUNTER — Telehealth: Payer: Self-pay | Admitting: Family Medicine

## 2022-03-02 NOTE — Telephone Encounter (Signed)
Pt called and wanted the nurse to know he is going to stop taking this medication: tamsulosin (FLOMAX) 0.4 MG CAPS capsule.  Pt states "it makes him sleep all day and he waked up really grumpy. He would like to try something else", please return the call when possible.  Callback Number: [phone]

## 2022-03-03 NOTE — Telephone Encounter (Signed)
Did flomax help with urination?   Did he try taking it at night?  If he hasn't tried it at night yet, I would try that first prior to changing the medicine.   Let me know if that doesn't help/isn't effective.  Thanks.

## 2022-03-05 NOTE — Telephone Encounter (Signed)
Spoke to pt. He said he could not really tell if it was helping his urine stream. He was taking it in the morning. I advised him to try taking it at night for a week and if he is still having problems to call the office back for something else.

## 2022-03-09 ENCOUNTER — Other Ambulatory Visit: Payer: Self-pay | Admitting: Family Medicine

## 2022-03-09 NOTE — Telephone Encounter (Signed)
Refill request for diazePAM 5 MG Oral Tablet  LOV - 02/19/22 Next OV - not scheduled Last refill - 01/26/22 #90/0

## 2022-03-10 ENCOUNTER — Ambulatory Visit (INDEPENDENT_AMBULATORY_CARE_PROVIDER_SITE_OTHER): Payer: Medicare Other

## 2022-03-10 DIAGNOSIS — E538 Deficiency of other specified B group vitamins: Secondary | ICD-10-CM | POA: Diagnosis not present

## 2022-03-10 MED ORDER — CYANOCOBALAMIN 1000 MCG/ML IJ SOLN
1000.0000 ug | Freq: Once | INTRAMUSCULAR | Status: AC
  Administered 2022-03-10: 1000 ug via INTRAMUSCULAR

## 2022-03-10 NOTE — Telephone Encounter (Signed)
Sent. Thanks.  He is going to need to see psychiatry.  When is that happening?

## 2022-03-10 NOTE — Progress Notes (Signed)
Per orders of Dr. Javier Gutierrez, injection of B-12 given by Shirlyn Savin in left deltoid. Patient tolerated injection well.    

## 2022-03-18 NOTE — Telephone Encounter (Signed)
Referral was placed on 02/21/22 but the [place] office has not contacted patient yet.

## 2022-03-19 ENCOUNTER — Encounter: Payer: Self-pay | Admitting: Gastroenterology

## 2022-03-19 NOTE — Telephone Encounter (Signed)
Please check with [facility] to see when they will contact patient/make sure they are going to contact patient.  Thanks.

## 2022-03-22 ENCOUNTER — Ambulatory Visit
Admission: RE | Admit: 2022-03-22 | Discharge: 2022-03-22 | Disposition: A | Discharge status: Home / Self Care | Payer: Medicare Other | Attending: Gastroenterology | Admitting: Gastroenterology

## 2022-03-22 ENCOUNTER — Ambulatory Visit: Payer: Medicare Other | Admitting: Anesthesiology

## 2022-03-22 ENCOUNTER — Encounter: Payer: Self-pay | Admitting: Gastroenterology

## 2022-03-22 ENCOUNTER — Encounter
Admission: RE | Disposition: A | Discharge status: Home / Self Care | Payer: Self-pay | Source: Home / Self Care | Attending: Gastroenterology

## 2022-03-22 DIAGNOSIS — I2510 Atherosclerotic heart disease of native coronary artery without angina pectoris: Secondary | ICD-10-CM | POA: Diagnosis not present

## 2022-03-22 DIAGNOSIS — Z1211 Encounter for screening for malignant neoplasm of colon: Secondary | ICD-10-CM | POA: Insufficient documentation

## 2022-03-22 DIAGNOSIS — J449 Chronic obstructive pulmonary disease, unspecified: Secondary | ICD-10-CM | POA: Insufficient documentation

## 2022-03-22 DIAGNOSIS — F32A Depression, unspecified: Secondary | ICD-10-CM | POA: Insufficient documentation

## 2022-03-22 DIAGNOSIS — K5730 Diverticulosis of large intestine without perforation or abscess without bleeding: Secondary | ICD-10-CM | POA: Insufficient documentation

## 2022-03-22 DIAGNOSIS — F419 Anxiety disorder, unspecified: Secondary | ICD-10-CM | POA: Insufficient documentation

## 2022-03-22 DIAGNOSIS — I252 Old myocardial infarction: Secondary | ICD-10-CM | POA: Diagnosis not present

## 2022-03-22 DIAGNOSIS — F17210 Nicotine dependence, cigarettes, uncomplicated: Secondary | ICD-10-CM | POA: Insufficient documentation

## 2022-03-22 DIAGNOSIS — R0602 Shortness of breath: Secondary | ICD-10-CM | POA: Insufficient documentation

## 2022-03-22 DIAGNOSIS — E785 Hyperlipidemia, unspecified: Secondary | ICD-10-CM | POA: Diagnosis not present

## 2022-03-22 DIAGNOSIS — M1990 Unspecified osteoarthritis, unspecified site: Secondary | ICD-10-CM | POA: Insufficient documentation

## 2022-03-22 DIAGNOSIS — I10 Essential (primary) hypertension: Secondary | ICD-10-CM | POA: Insufficient documentation

## 2022-03-22 DIAGNOSIS — Z86010 Personal history of colonic polyps: Secondary | ICD-10-CM | POA: Insufficient documentation

## 2022-03-22 DIAGNOSIS — D126 Benign neoplasm of colon, unspecified: Secondary | ICD-10-CM | POA: Diagnosis not present

## 2022-03-22 DIAGNOSIS — Z955 Presence of coronary angioplasty implant and graft: Secondary | ICD-10-CM | POA: Diagnosis not present

## 2022-03-22 DIAGNOSIS — D124 Benign neoplasm of descending colon: Secondary | ICD-10-CM | POA: Insufficient documentation

## 2022-03-22 DIAGNOSIS — K635 Polyp of colon: Secondary | ICD-10-CM | POA: Diagnosis not present

## 2022-03-22 DIAGNOSIS — D122 Benign neoplasm of ascending colon: Secondary | ICD-10-CM | POA: Diagnosis not present

## 2022-03-22 HISTORY — PX: COLONOSCOPY WITH PROPOFOL: SHX5780

## 2022-03-22 SURGERY — COLONOSCOPY WITH PROPOFOL
Anesthesia: General

## 2022-03-22 MED ORDER — LIDOCAINE HCL (CARDIAC) PF 100 MG/5ML IV SOSY
PREFILLED_SYRINGE | INTRAVENOUS | Status: DC | PRN
  Administered 2022-03-22: 50 mg via INTRAVENOUS

## 2022-03-22 MED ORDER — SODIUM CHLORIDE 0.9 % IV SOLN: INTRAVENOUS | Status: DC

## 2022-03-22 MED ORDER — PROPOFOL 10 MG/ML IV BOLUS
INTRAVENOUS | Status: DC | PRN
  Administered 2022-03-22: 60 mg via INTRAVENOUS

## 2022-03-22 MED ORDER — PROPOFOL 500 MG/50ML IV EMUL
INTRAVENOUS | Status: DC | PRN
  Administered 2022-03-22: 140 ug/kg/min via INTRAVENOUS

## 2022-03-22 NOTE — Anesthesia Postprocedure Evaluation (Signed)
Anesthesia Post Note  Patient: Matthew Norton  Procedure(s) Performed: COLONOSCOPY WITH PROPOFOL  Patient location during evaluation: Endoscopy Anesthesia Type: General Level of consciousness: awake and alert Pain management: pain level controlled Vital Signs Assessment: post-procedure vital signs reviewed and stable Respiratory status: spontaneous breathing, nonlabored ventilation, respiratory function stable and patient connected to nasal cannula oxygen Cardiovascular status: blood pressure returned to baseline and stable Postop Assessment: no apparent nausea or vomiting Anesthetic complications: no   No notable events documented.   Last Vitals:  Vitals:   03/22/22 1023 03/22/22 1030  BP: (!) 88/46 100/73  Pulse: 70 60  Resp: 19 14  Temp:    SpO2: 99% 99%    Last Pain:  Vitals:   03/22/22 1013  TempSrc: Temporal                 Precious Haws Elohim Brune

## 2022-03-22 NOTE — Op Note (Signed)
[facility] Gastroenterology Patient Name: Matthew Norton Procedure Date: 03/22/2022 9:35 AM MRN: [id number] Account #: [id number] Date of Birth: Jan 21, 1946 Admit Type: Outpatient Age: 76 Room: [facility] ENDO ROOM 1 Gender: Male Note Status: Finalized Instrument Name: Jasper Riling [id number] Procedure:             Colonoscopy Indications:           Surveillance: Personal history of adenomatous polyps                         on last colonoscopy > 3 years ago Providers:             Jonathon Bellows MD, MD Referring MD:          Elveria Rising. Damita Dunnings, MD (Referring MD) Medicines:             Monitored Anesthesia Care Complications:         No immediate complications. Procedure:             Pre-Anesthesia Assessment:                        - Prior to the procedure, a History and Physical was                         performed, and patient medications, allergies and                         sensitivities were reviewed. The patient's tolerance                         of previous anesthesia was reviewed.                        - The risks and benefits of the procedure and the                         sedation options and risks were discussed with the                         patient. All questions were answered and informed                         consent was obtained.                        - ASA Grade Assessment: II - A patient with mild                         systemic disease.                        After obtaining informed consent, the colonoscope was                         passed under direct vision. Throughout the procedure,                         the patient's blood pressure, pulse, and oxygen  saturations were monitored continuously. The                         Colonoscope was introduced through the anus and                         advanced to the the cecum, identified by the                         appendiceal orifice. The colonoscopy was performed                          with ease. The patient tolerated the procedure well.                         The quality of the bowel preparation was excellent. Findings:      The perianal and digital rectal examinations were normal.      A 5 mm polyp was found in the ascending colon. The polyp was sessile.       The polyp was removed with a cold snare. Resection and retrieval were       complete.      Three sessile polyps were found in the descending colon. The polyps were       6 to 8 mm in size. These polyps were removed with a cold snare.       Resection and retrieval were complete. To prevent bleeding after the       polypectomy, one hemostatic clip was successfully placed. There was no       bleeding during, or at the end, of the procedure.      Multiple small-mouthed diverticula were found in the sigmoid colon.      The exam was otherwise without abnormality on direct and retroflexion       views. Impression:            - One 5 mm polyp in the ascending colon, removed with                         a cold snare. Resected and retrieved.                        - Three 6 to 8 mm polyps in the descending colon,                         removed with a cold snare. Resected and retrieved.                         Clip was placed.                        - Diverticulosis in the sigmoid colon.                        - The examination was otherwise normal on direct and                         retroflexion views. Recommendation:        - Discharge patient to home (with escort).                        -  Resume previous diet.                        - Continue present medications.                        - Await pathology results.                        - Repeat colonoscopy is not recommended due to current                         age (50 years or older) for surveillance. Procedure Code(s):     --- Professional ---                        [id number], Colonoscopy, flexible; with removal of                         tumor(s),  polyp(s), or other lesion(s) by snare                         technique Diagnosis Code(s):     --- Professional ---                        K63.5, Polyp of colon                        Z86.010, Personal history of colonic polyps                        K57.30, Diverticulosis of large intestine without                         perforation or abscess without bleeding CPT copyright 2019 American Medical Association. All rights reserved. The codes documented in this report are preliminary and upon coder review may  be revised to meet current compliance requirements. Jonathon Bellows, MD Jonathon Bellows MD, MD 03/22/2022 10:12:32 AM This report has been signed electronically. Number of Addenda: 0 Note Initiated On: 03/22/2022 9:35 AM Scope Withdrawal Time: 0 hours 9 minutes 58 seconds  Total Procedure Duration: 0 hours 16 minutes 4 seconds  Estimated Blood Loss:  Estimated blood loss: none.      [facility]

## 2022-03-22 NOTE — Transfer of Care (Signed)
Immediate Anesthesia Transfer of Care Note  Patient: Matthew Norton  Procedure(s) Performed: COLONOSCOPY WITH PROPOFOL  Patient Location: Endoscopy Unit  Anesthesia Type:General  Level of Consciousness: drowsy  Airway & Oxygen Therapy: Patient Spontanous Breathing  Post-op Assessment: Report given to RN and Post -op Vital signs reviewed and stable  Post vital signs: Reviewed and stable  Last Vitals:  Vitals Value Taken Time  BP 109/48 03/22/22 1013  Temp 35.8 C 03/22/22 1013  Pulse 64 03/22/22 1013  Resp 13 03/22/22 1013  SpO2 97 % 03/22/22 1013    Last Pain:  Vitals:   03/22/22 1013  TempSrc: Temporal         Complications: No notable events documented.

## 2022-03-22 NOTE — Anesthesia Preprocedure Evaluation (Signed)
Anesthesia Evaluation  Patient identified by MRN, date of birth, ID band Patient awake    Reviewed: Allergy & Precautions, NPO status , Patient's Chart, lab work & pertinent test results  History of Anesthesia Complications (+) DIFFICULT IV STICK / SPECIAL LINE and history of anesthetic complications  Airway Mallampati: III  TM Distance: >3 FB Neck ROM: full    Dental  (+) Missing   Pulmonary shortness of breath and with exertion, COPD, Current Smoker and Patient abstained from smoking.,    Pulmonary exam normal        Cardiovascular hypertension, + CAD, + Past MI and + Cardiac Stents  Normal cardiovascular exam     Neuro/Psych PSYCHIATRIC DISORDERS  Neuromuscular disease    GI/Hepatic negative GI ROS, Neg liver ROS, neg GERD  ,  Endo/Other  negative endocrine ROS  Renal/GU negative Renal ROS  negative genitourinary   Musculoskeletal   Abdominal   Peds  Hematology negative hematology ROS (+)   Anesthesia Other Findings Past Medical History: No date: Anxiety     Comment:  takes Valium daily as needed No date: Arthritis No date: Cataracts, bilateral No date: Complication of anesthesia     Comment:  slow to wake up after one surgery No date: Coronary artery disease No date: Depression No date: Hyperlipemia     Comment:  has tried meds but not on any bc of joint aches and               pains No date: Hypertension     Comment:  takes Metoprolol and Losartan daily No date: Insomnia No date: Joint pain No date: Peripheral edema No date: Shortness of breath     Comment:  with exertion  No date: Weakness     Comment:  numbness in both hands and feet.  Past Surgical History: 03/24/2015: ANTERIOR CERVICAL DECOMP/DISCECTOMY FUSION; N/A     Comment:  Procedure: Cervical four- five, anterior cervical               decompression with fusion, plating and bonegraft;                Surgeon: Jovita Gamma, MD;   Location: [place] [facility];                Service: Neurosurgery;  Laterality: N/A;  C45 anterior               cervical decompression with fusion plating and bonegraft 2001: BACK SURGERY     Comment:  lumb lam 08,11: CARDIAC CATHETERIZATION     Comment:  stents x2 1965: CLAVICLE SURGERY     Comment:  fx rt  03/31/2017: COLONOSCOPY WITH PROPOFOL; N/A     Comment:  Procedure: COLONOSCOPY WITH PROPOFOL;  Surgeon: Jonathon Bellows, MD;  Location: [facility] ENDOSCOPY;  Service:               Endoscopy;  Laterality: N/A; 08/25/2017: COLONOSCOPY WITH PROPOFOL; N/A     Comment:  Procedure: COLONOSCOPY WITH PROPOFOL;  Surgeon: Jonathon Bellows, MD;  Location: [facility] ENDOSCOPY;  Service:               Endoscopy;  Laterality: N/A; No date: CORONARY ANGIOPLASTY     Comment:  2 stents No date: ESOPHAGOGASTRODUODENOSCOPY 2007: HERNIA REPAIR     Comment:  rt and lt ing  09/13/2013: NAILBED REPAIR; Left     Comment:  Procedure: LEFT THUMB NAIL REMOVAL, NAIL BED               RECONSTRUCTION PARTIAL EXCISION DISTAL PHALANX WITH               COMPLEX RECONSTRUCTION ;  Surgeon: Roseanne Kaufman, MD;                Location: [place];  Service:               Orthopedics;  Laterality: Left; No date: parotid mass removed 2003: REPLANTATION THUMB     Comment:  left     Reproductive/Obstetrics negative OB ROS                             Anesthesia Physical Anesthesia Plan  ASA: 3  Anesthesia Plan: General   Post-op Pain Management:    Induction: Intravenous  PONV Risk Score and Plan: Propofol infusion and TIVA  Airway Management Planned: Natural Airway and Nasal Cannula  Additional Equipment:   Intra-op Plan:   Post-operative Plan:   Informed Consent: I have reviewed the patients History and Physical, chart, labs and discussed the procedure including the risks, benefits and alternatives for the proposed anesthesia with the patient or  authorized representative who has indicated his/her understanding and acceptance.     Dental Advisory Given  Plan Discussed with: Anesthesiologist, CRNA and Surgeon  Anesthesia Plan Comments: (Patient consented for risks of anesthesia including but not limited to:  - adverse reactions to medications - risk of airway placement if required - damage to eyes, teeth, lips or other oral mucosa - nerve damage due to positioning  - sore throat or hoarseness - Damage to heart, brain, nerves, lungs, other parts of body or loss of life  Patient voiced understanding.)        Anesthesia Quick Evaluation

## 2022-03-22 NOTE — H&P (Signed)
Matthew Bellows, MD [address], [place], [place], [place], [address] [id number], [place], [place], [place], [address] Phone: [phone]  Fax: [phone]  Primary Care Physician:  Tonia Ghent, MD   Pre-Procedure History & Physical: HPI:  Matthew Norton is a 76 y.o. male is here for an colonoscopy.   Past Medical History:  Diagnosis Date   Anxiety    takes Valium daily as needed   Arthritis    Cataracts, bilateral    Complication of anesthesia    slow to wake up after one surgery   Coronary artery disease    Depression    Hyperlipemia    has tried meds but not on any bc of joint aches and pains   Hypertension    takes Metoprolol and Losartan daily   Insomnia    Joint pain    Peripheral edema    Shortness of breath    with exertion    Weakness    numbness in both hands and feet.    Past Surgical History:  Procedure Laterality Date   ANTERIOR CERVICAL DECOMP/DISCECTOMY FUSION N/A 03/24/2015   Procedure: Cervical four- five, anterior cervical decompression with fusion, plating and bonegraft;  Surgeon: Jovita Gamma, MD;  Location: [place] [facility]URO ORS;  Service: Neurosurgery;  Laterality: N/A;  C45 anterior cervical decompression with fusion plating and bonegraft   BACK SURGERY  2001   lumb lam   CARDIAC CATHETERIZATION  08,11   stents x2   [place]   fx rt    COLONOSCOPY WITH PROPOFOL N/A 03/31/2017   Procedure: COLONOSCOPY WITH PROPOFOL;  Surgeon: Matthew Bellows, MD;  Location: [facility] ENDOSCOPY;  Service: Endoscopy;  Laterality: N/A;   COLONOSCOPY WITH PROPOFOL N/A 08/25/2017   Procedure: COLONOSCOPY WITH PROPOFOL;  Surgeon: Matthew Bellows, MD;  Location: [facility] ENDOSCOPY;  Service: Endoscopy;  Laterality: N/A;   CORONARY ANGIOPLASTY     2 stents   ESOPHAGOGASTRODUODENOSCOPY     HERNIA REPAIR  2007   rt and lt ing    NAILBED REPAIR Left 09/13/2013   Procedure: LEFT THUMB NAIL REMOVAL, NAIL BED RECONSTRUCTION PARTIAL EXCISION DISTAL PHALANX WITH COMPLEX  RECONSTRUCTION ;  Surgeon: Roseanne Kaufman, MD;  Location: [place];  Service: Orthopedics;  Laterality: Left;   parotid mass removed     REPLANTATION THUMB  2003   left    Prior to Admission medications   Medication Sig Start Date End Date Taking? Authorizing Provider  acetaminophen (TYLENOL) 500 MG tablet Take 2 tablets (1,000 mg total) by mouth every 8 (eight) hours as needed (for pain). 03/01/16  Yes Tonia Ghent, MD  aspirin 81 MG tablet Take 81 mg by mouth daily.   Yes [provider]  cetirizine (ZYRTEC) 10 MG tablet Take 1 tablet (10 mg total) by mouth daily. For allergies 04/29/20  Yes Tonia Ghent, MD  cyanocobalamin (,VITAMIN B-12,) 1000 MCG/ML injection 1000 mcg IM every 14 days 08/16/20  Yes Tonia Ghent, MD  diazepam (VALIUM) 5 MG tablet TAKE 1/2 TO 1 (ONE-HALF TO ONE) TABLET BY MOUTH IN THE MORNING AND 1 TO 2 NIGHTLY (MAX  OF  THREE  TABLETS  PER  DAY) 03/10/22  Yes Tonia Ghent, MD  fluticasone (FLONASE) 50 MCG/ACT nasal spray Place 2 sprays into both nostrils daily. For allergies 04/29/20  Yes Tonia Ghent, MD  losartan (COZAAR) 25 MG tablet Take 1 tablet (25 mg total) by mouth daily. 02/19/22  Yes Tonia Ghent,  MD  meloxicam (MOBIC) 7.5 MG tablet TAKE 1 TABLET BY MOUTH ONCE DAILY (NEEDS  APPOINTMENT  FOR  FURTHER  REFILLS) 07/07/21  Yes Tonia Ghent, MD  nitroGLYCERIN (NITROSTAT) 0.4 MG SL tablet Place 1 tablet (0.4 mg total) under the tongue every 5 (five) minutes as needed for chest pain. Max 3 doses in 15 minutes. 02/19/22  Yes Tonia Ghent, MD  tamsulosin (FLOMAX) 0.4 MG CAPS capsule Take 1 capsule (0.4 mg total) by mouth daily. 02/21/22  Yes Tonia Ghent, MD    Allergies as of 02/22/2022 - Review Complete 02/22/2022  Allergen Reaction Noted   Bupropion Other (See Comments) 03/25/2014   Hydrochlorothiazide Other (See Comments) 12/04/2014   Paroxetine hcl Other (See Comments) 12/04/2014   Venlafaxine Other (See  Comments) 12/04/2014   Flexeril [cyclobenzaprine] Other (See Comments) 10/25/2014   Gabapentin Other (See Comments) 03/25/2016   Hydroxyzine Other (See Comments) 04/07/2017   Lipitor [atorvastatin] Other (See Comments) 04/29/2016   Pravastatin Other (See Comments) 10/25/2014    Family History  Problem Relation Age of Onset   Hypertension Mother    Cancer Mother    Cancer Father    Colon cancer Father    Prostate cancer Father     Social History   Socioeconomic History   Marital status: Single    Spouse name: Not on file   Number of children: Not on file   Years of education: Not on file   Highest education level: Not on file  Occupational History   Not on file  Tobacco Use   Smoking status: Every Day    Packs/day: 1.00    Years: 50.00    Total pack years: 50.00    Types: Cigarettes   Smokeless tobacco: Never  Vaping Use   Vaping Use: Never used  Substance and Sexual Activity   Alcohol use: No    Alcohol/week: 0.0 standard drinks of alcohol   Drug use: No   Sexual activity: Never  Other Topics Concern   Not on file  Social History Narrative   2 kids   In relationship with long term girlfriend   Social Determinants of Health   Financial Resource Strain: High Risk (08/06/2021)   Overall Financial Resource Strain (CARDIA)    Difficulty of Paying Living Expenses: Very hard  Food Insecurity: No Food Insecurity (08/06/2021)   Hunger Vital Sign    Worried About Running Out of Food in the Last Year: Never true    Ran Out of Food in the Last Year: Never true  Transportation Needs: No Transportation Needs (08/06/2021)   PRAPARE - Hydrologist (Medical): No    Lack of Transportation (Non-Medical): No  Physical Activity: Inactive (08/06/2021)   Exercise Vital Sign    Days of Exercise per Week: 0 days    Minutes of Exercise per Session: 0 min  Stress: No Stress Concern Present (08/06/2021)   [place]    Feeling of Stress : Not at all  Social Connections: Socially Isolated (08/06/2021)   Social Connection and Isolation Panel [NHANES]    Frequency of Communication with Friends and Family: More than three times a week    Frequency of Social Gatherings with Friends and Family: Twice a week    Attends Religious Services: Never    Marine scientist or Organizations: No    Attends Archivist Meetings: Never    Marital Status: Never married  Intimate Partner Violence: Not At Risk (08/06/2021)   Humiliation, Afraid, Rape, and Kick questionnaire    Fear of Current or Ex-Partner: No    Emotionally Abused: No    Physically Abused: No    Sexually Abused: No    Review of Systems: See HPI, otherwise negative ROS  Physical Exam: There were no vitals taken for this visit. General:   Alert,  pleasant and cooperative in NAD Head:  Normocephalic and atraumatic. Neck:  Supple; no masses or thyromegaly. Lungs:  Clear throughout to auscultation, normal respiratory effort.    Heart:  +S1, +S2, Regular rate and rhythm, No edema. Abdomen:  Soft, nontender and nondistended. Normal bowel sounds, without guarding, and without rebound.   Neurologic:  Alert and  oriented x4;  grossly normal neurologically.  Impression/Plan: MEDARDO HASSING is here for an colonoscopy to be performed for surveillance due to prior history of colon polyps   Risks, benefits, limitations, and alternatives regarding  colonoscopy have been reviewed with the patient.  Questions have been answered.  All parties agreeable.   Matthew Bellows, MD  03/22/2022, 9:39 AM

## 2022-03-22 NOTE — Anesthesia Procedure Notes (Signed)
Date/Time: 03/22/2022 9:53 AM  Performed by: Lily Peer, Brieann Osinski, CRNAPre-anesthesia Checklist: Patient identified, Emergency Drugs available, Suction available, Patient being monitored and Timeout performed Patient Re-evaluated:Patient Re-evaluated prior to induction Oxygen Delivery Method: Nasal cannula Induction Type: IV induction

## 2022-03-23 ENCOUNTER — Encounter: Payer: Self-pay | Admitting: Gastroenterology

## 2022-03-23 LAB — SURGICAL PATHOLOGY

## 2022-03-23 NOTE — Progress Notes (Signed)
ok 

## 2022-03-31 ENCOUNTER — Ambulatory Visit: Payer: Medicare Other

## 2022-04-01 ENCOUNTER — Ambulatory Visit: Payer: Medicare Other

## 2022-04-15 ENCOUNTER — Ambulatory Visit: Payer: Medicare Other

## 2022-04-21 ENCOUNTER — Ambulatory Visit: Payer: Medicare Other

## 2022-04-25 ENCOUNTER — Other Ambulatory Visit: Payer: Self-pay | Admitting: Family Medicine

## 2022-04-26 NOTE — Telephone Encounter (Signed)
Refill request for diazePAM 5 MG Oral Tablet  LOV - 02/19/22 Next OV - not scheduled Last refill - 02/19/22 #90/0

## 2022-04-27 ENCOUNTER — Ambulatory Visit (INDEPENDENT_AMBULATORY_CARE_PROVIDER_SITE_OTHER): Payer: Medicare Other | Admitting: *Deleted

## 2022-04-27 DIAGNOSIS — E538 Deficiency of other specified B group vitamins: Secondary | ICD-10-CM

## 2022-04-27 MED ORDER — CYANOCOBALAMIN 1000 MCG/ML IJ SOLN
1000.0000 ug | Freq: Once | INTRAMUSCULAR | Status: AC
  Administered 2022-04-27: 1000 ug via INTRAMUSCULAR

## 2022-04-27 NOTE — Telephone Encounter (Signed)
Spoke with patient while he was in the office for his NV. I explained to patient to start tapering down medication. He is currently taking 1 tablet BID. I advised him to try to start taking 1 tablet once a day for a week or two and then call back to let us know how he is doing. I let him know if he is not any better with tapering off the medication then we will need redo his referral to psy. Patient verbalized understanding.

## 2022-04-27 NOTE — Telephone Encounter (Signed)
Noted. Thanks.

## 2022-04-27 NOTE — Progress Notes (Signed)
Per orders of Dr. Damita Dunnings, injection of Vitamin B12 in left deltoid given by Lauralyn Primes.  Patient tolerated injection well.

## 2022-04-27 NOTE — Telephone Encounter (Signed)
Per referral notes:  Specialty Comments 04/05/2022  2:56 PM Carlynn Purl, CMA [facility] on 04-05-22 @ 2:55 pt states that he is not interested in setting up an appt. He is doing well.

## 2022-04-27 NOTE — Telephone Encounter (Signed)
If doing well, then he can taper off diazepam slowly and stop med.  If he still needs the med then he needs to follow up with psych.  Let me know if he needs another referral.  Thanks.

## 2022-04-27 NOTE — Telephone Encounter (Signed)
Sent. Thanks. Please find out when he is seeing psychiatry.

## 2022-05-05 DIAGNOSIS — M170 Bilateral primary osteoarthritis of knee: Secondary | ICD-10-CM | POA: Diagnosis not present

## 2022-05-14 DIAGNOSIS — M170 Bilateral primary osteoarthritis of knee: Secondary | ICD-10-CM | POA: Diagnosis not present

## 2022-05-31 ENCOUNTER — Emergency Department (HOSPITAL_COMMUNITY): Payer: Medicare Other

## 2022-05-31 ENCOUNTER — Encounter (HOSPITAL_COMMUNITY): Payer: Self-pay

## 2022-05-31 ENCOUNTER — Other Ambulatory Visit: Payer: Self-pay

## 2022-05-31 ENCOUNTER — Emergency Department (HOSPITAL_COMMUNITY)
Admission: EM | Admit: 2022-05-31 | Discharge: 2022-06-01 | Disposition: A | Discharge status: Home / Self Care | Payer: Medicare Other | Attending: Emergency Medicine | Admitting: Emergency Medicine

## 2022-05-31 DIAGNOSIS — E042 Nontoxic multinodular goiter: Secondary | ICD-10-CM | POA: Diagnosis not present

## 2022-05-31 DIAGNOSIS — Z79899 Other long term (current) drug therapy: Secondary | ICD-10-CM | POA: Diagnosis not present

## 2022-05-31 DIAGNOSIS — Z7982 Long term (current) use of aspirin: Secondary | ICD-10-CM | POA: Insufficient documentation

## 2022-05-31 DIAGNOSIS — W1839XA Other fall on same level, initial encounter: Secondary | ICD-10-CM | POA: Insufficient documentation

## 2022-05-31 DIAGNOSIS — M542 Cervicalgia: Secondary | ICD-10-CM | POA: Diagnosis not present

## 2022-05-31 DIAGNOSIS — R0600 Dyspnea, unspecified: Secondary | ICD-10-CM | POA: Diagnosis not present

## 2022-05-31 DIAGNOSIS — R202 Paresthesia of skin: Secondary | ICD-10-CM | POA: Diagnosis not present

## 2022-05-31 DIAGNOSIS — M4802 Spinal stenosis, cervical region: Secondary | ICD-10-CM | POA: Insufficient documentation

## 2022-05-31 DIAGNOSIS — Z743 Need for continuous supervision: Secondary | ICD-10-CM | POA: Diagnosis not present

## 2022-05-31 DIAGNOSIS — W19XXXA Unspecified fall, initial encounter: Secondary | ICD-10-CM

## 2022-05-31 DIAGNOSIS — I700 Atherosclerosis of aorta: Secondary | ICD-10-CM | POA: Insufficient documentation

## 2022-05-31 DIAGNOSIS — R531 Weakness: Secondary | ICD-10-CM | POA: Insufficient documentation

## 2022-05-31 DIAGNOSIS — M5021 Other cervical disc displacement,  high cervical region: Secondary | ICD-10-CM | POA: Diagnosis not present

## 2022-05-31 DIAGNOSIS — R4789 Other speech disturbances: Secondary | ICD-10-CM | POA: Diagnosis not present

## 2022-05-31 DIAGNOSIS — M546 Pain in thoracic spine: Secondary | ICD-10-CM | POA: Insufficient documentation

## 2022-05-31 DIAGNOSIS — I2510 Atherosclerotic heart disease of native coronary artery without angina pectoris: Secondary | ICD-10-CM | POA: Diagnosis not present

## 2022-05-31 DIAGNOSIS — G319 Degenerative disease of nervous system, unspecified: Secondary | ICD-10-CM | POA: Diagnosis not present

## 2022-05-31 DIAGNOSIS — M79601 Pain in right arm: Secondary | ICD-10-CM | POA: Insufficient documentation

## 2022-05-31 DIAGNOSIS — I10 Essential (primary) hypertension: Secondary | ICD-10-CM | POA: Insufficient documentation

## 2022-05-31 DIAGNOSIS — M47812 Spondylosis without myelopathy or radiculopathy, cervical region: Secondary | ICD-10-CM | POA: Insufficient documentation

## 2022-05-31 DIAGNOSIS — R079 Chest pain, unspecified: Secondary | ICD-10-CM | POA: Diagnosis not present

## 2022-05-31 DIAGNOSIS — R519 Headache, unspecified: Secondary | ICD-10-CM | POA: Diagnosis not present

## 2022-05-31 DIAGNOSIS — R911 Solitary pulmonary nodule: Secondary | ICD-10-CM | POA: Diagnosis not present

## 2022-05-31 DIAGNOSIS — R29898 Other symptoms and signs involving the musculoskeletal system: Secondary | ICD-10-CM

## 2022-05-31 DIAGNOSIS — I6782 Cerebral ischemia: Secondary | ICD-10-CM | POA: Diagnosis not present

## 2022-05-31 DIAGNOSIS — M47814 Spondylosis without myelopathy or radiculopathy, thoracic region: Secondary | ICD-10-CM | POA: Diagnosis not present

## 2022-05-31 DIAGNOSIS — M4312 Spondylolisthesis, cervical region: Secondary | ICD-10-CM | POA: Diagnosis not present

## 2022-05-31 DIAGNOSIS — M50222 Other cervical disc displacement at C5-C6 level: Secondary | ICD-10-CM | POA: Diagnosis not present

## 2022-05-31 LAB — CBC WITH DIFFERENTIAL/PLATELET
Abs Immature Granulocytes: 0.02 10*3/uL (ref 0.00–0.07)
Basophils Absolute: 0 10*3/uL (ref 0.0–0.1)
Basophils Relative: 0 %
Eosinophils Absolute: 0.1 10*3/uL (ref 0.0–0.5)
Eosinophils Relative: 1 %
HCT: 48.2 % (ref 39.0–52.0)
Hemoglobin: 16.2 g/dL (ref 13.0–17.0)
Immature Granulocytes: 0 %
Lymphocytes Relative: 40 %
Lymphs Abs: 2.1 10*3/uL (ref 0.7–4.0)
MCH: 32.5 pg (ref 26.0–34.0)
MCHC: 33.6 g/dL (ref 30.0–36.0)
MCV: 96.6 fL (ref 80.0–100.0)
Monocytes Absolute: 0.4 10*3/uL (ref 0.1–1.0)
Monocytes Relative: 7 %
Neutro Abs: 2.7 10*3/uL (ref 1.7–7.7)
Neutrophils Relative %: 52 %
Platelets: 178 10*3/uL (ref 150–400)
RBC: 4.99 MIL/uL (ref 4.22–5.81)
RDW: 12.3 % (ref 11.5–15.5)
WBC: 5.2 10*3/uL (ref 4.0–10.5)
nRBC: 0 % (ref 0.0–0.2)

## 2022-05-31 LAB — BASIC METABOLIC PANEL
Anion gap: 8 (ref 5–15)
BUN: 9 mg/dL (ref 8–23)
CO2: 26 mmol/L (ref 22–32)
Calcium: 9.8 mg/dL (ref 8.9–10.3)
Chloride: 103 mmol/L (ref 98–111)
Creatinine, Ser: 1.18 mg/dL (ref 0.61–1.24)
GFR, Estimated: >60 mL/min (ref >60)
Glucose, Bld: 87 mg/dL (ref 70–99)
Potassium: 4.7 mmol/L (ref 3.5–5.1)
Sodium: 137 mmol/L (ref 135–145)

## 2022-05-31 MED ORDER — IOHEXOL 300 MG/ML  SOLN
75.0000 mL | Freq: Once | INTRAMUSCULAR | Status: AC | PRN
  Administered 2022-05-31: 75 mL via INTRAVENOUS

## 2022-05-31 NOTE — ED Triage Notes (Signed)
Pt BIB [facility] for eval of fall that happened yesterday. Pt reports he is now having R arm and R leg pain since fall yesterday. Pt also reports tingling to R arm which he reports has been going on "for a while" >24 hours. Pt reports he "lost his balance" prior to the fall yesterday.

## 2022-05-31 NOTE — ED Provider Triage Note (Signed)
Emergency Medicine Provider Triage Evaluation Note  Matthew Norton , a 76 y.o. male  was evaluated in triage.  Pt complains of worsening right arm pain over the last 1 to 2 days.  Also fell yesterday while trying to take his pants off, and slid to the floor onto his bottom.  Denies hitting his head or losing consciousness.  Denies bottom or lower back pain.  States the right arm pain is what concerned him and caused him to call 911.  Has already been seen by PCP and Ortho for this, told he has arthritis.  Hx of C4-C5 ACDF.  Denies fever or shortness of breath.  Review of Systems  Positive:  Negative: See above  Physical Exam  BP 132/65   Pulse (!) 32   Temp 98.6 F (37 C)   Resp 16   Ht 5' 7.5" (1.715 m)   Wt 88 kg   SpO2 100%   BMI 29.94 kg/m  Gen:   Awake, no distress   Resp:  Normal effort  MSK:   Moves extremities without difficulty  Other:  Mild tenderness of the right shoulder joint and with ROM.  Midline spinal tenderness, at baseline.  Medical Decision Making  Medically screening exam initiated at 11:55 AM.  Appropriate orders placed.  STEIN WINDHORST was informed that the remainder of the evaluation will be completed by another provider, this initial triage assessment does not replace that evaluation, and the importance of remaining in the ED until their evaluation is complete.     Prince Rome, PA-C [id number]

## 2022-05-31 NOTE — ED Provider Notes (Signed)
[facility] EMERGENCY DEPARTMENT Provider Note   CSN: [id number] Arrival date & time: 05/31/22  1103     History  Chief Complaint  Patient presents with   Matthew Norton is a 76 y.o. male with a past medical history of cervical spinal stenosis, coronary artery disease, hyperlipidemia, hypertension, anxiety presenting to the emergency department with a 82-monthhistory of right arm pain.  Patient reports that he also had a fall.  He states that he fell because his right knee gave out due to osteoarthritis.  Patient states that this arm pain can be burning, aching, stinging.  It starts in his right shoulder and radiates down to his hand.  He also reports posterior neck pain.  He also describes his pain to be a burning, aching, stinging pain.  He also reports having pain in his right leg.  This is also a burning/stinging, and achy pain.  He reports that a week ago he saw his orthopedics doctor, who gave him a muscle relaxer and pain medication, but he states that he had no relief.  He denies any fevers, chills, incontinence.  He does report having some night sweats occasionally.  He denies any IV drug use.  This pain started randomly, and was not associated with any trauma and continue to worsen. He states that this pain pain is constant.  He does report his knees give out at times, but this is separate from his pain.  He does note that he has had surgery on his neck in the past for a pinched nerve.  He thinks that he feels that it is a pinched nerve again.  Of note, patient had a C4-C5 anterior cervical decompression and arthrodesis in 2016.  Fall Pertinent negatives include no chest pain and no shortness of breath.       Home Medications Prior to Admission medications   Medication Sig Start Date End Date Taking? Authorizing Provider  acetaminophen (TYLENOL) 500 MG tablet Take 2 tablets (1,000 mg total) by mouth every 8 (eight) hours as needed (for pain). 03/01/16    DTonia Ghent MD  aspirin 81 MG tablet Take 81 mg by mouth daily.    [provider]  cetirizine (ZYRTEC) 10 MG tablet Take 1 tablet (10 mg total) by mouth daily. For allergies 04/29/20   DTonia Ghent MD  cyanocobalamin (,VITAMIN B-12,) 1000 MCG/ML injection 1000 mcg IM every 14 days 08/16/20   DTonia Ghent MD  diazepam (VALIUM) 5 MG tablet TAKE 1/2 TO 1 (ONE-HALF TO ONE) TABLET BY MOUTH ONCE DAILY IN THE MORNING AND 1 TO 2 TABS NIGHTLY - MAX OF 3 TABS PER DAY 04/27/22   DTonia Ghent MD  fluticasone (FLONASE) 50 MCG/ACT nasal spray Place 2 sprays into both nostrils daily. For allergies 04/29/20   DTonia Ghent MD  losartan (COZAAR) 25 MG tablet Take 1 tablet (25 mg total) by mouth daily. 02/19/22   DTonia Ghent MD  meloxicam (MOBIC) 7.5 MG tablet TAKE 1 TABLET BY MOUTH ONCE DAILY (NEEDS  APPOINTMENT  FOR  FURTHER  REFILLS) 07/07/21   DTonia Ghent MD  nitroGLYCERIN (NITROSTAT) 0.4 MG SL tablet Place 1 tablet (0.4 mg total) under the tongue every 5 (five) minutes as needed for chest pain. Max 3 doses in 15 minutes. 02/19/22   DTonia Ghent MD  tamsulosin (FLOMAX) 0.4 MG CAPS capsule Take 1 capsule (0.4 mg total) by mouth daily. 02/21/22   DDamita Dunnings  Elveria Rising, MD      Allergies    Bupropion, Hydrochlorothiazide, Paroxetine hcl, Venlafaxine, Flexeril [cyclobenzaprine], Gabapentin, Hydroxyzine, Lipitor [atorvastatin], and Pravastatin    Review of Systems   Review of Systems  Constitutional:  Negative for chills and fever.  Respiratory:  Negative for shortness of breath.   Cardiovascular:  Negative for chest pain.  Gastrointestinal:  Negative for diarrhea, nausea and vomiting.  Musculoskeletal:  Positive for back pain, myalgias and neck pain. Negative for neck stiffness.    Physical Exam Updated Vital Signs BP (!) 160/88   Pulse (!) 51   Temp 98.6 F (37 C) (Oral)   Resp 18   Ht 5' 7.5" (1.715 m)   Wt 88 kg   SpO2 100%   BMI 29.94 kg/m  Physical  Exam  General: Patient is resting comfortably in bed in no acute distress  Head: Normocephalic, atraumatic  Neck: There is midline neck tenderness noted to base of neck.  No paraspinal neck muscle tenderness.  Full range of motion intact. Cardio: Regular rate and rhythm, no murmurs, rubs or gallops. 2+ pulses to bilateral upper and lower extremities  Chest: No chest tenderness Pulmonary: Clear to ausculation bilaterally with no rales, rhonchi, and crackles  Abdomen: Soft, nontender with normoactive bowel sounds with no rebound or guarding  Neuro: Sensation intact to upper and lower extremities. Back: Thoracic midline tenderness.  No paraspinal tenderness noted.  No step-off or deformity appreciated. MSK: 4 out of 5 strength to bilateral upper extremities.  5 out of 5 strength to shoulder on abduction and flexion.  Negative straight leg bilaterally.  4/5 strength to hip flexion on right lower extremity.  5/5 strength to hip flexion on left lower extremity.  Full active and passive range of motion bilaterally to upper and lower extremities.   ED Results / Procedures / Treatments   Labs (all labs ordered are listed, but only abnormal results are displayed) Labs Reviewed  BASIC METABOLIC PANEL  CBC WITH DIFFERENTIAL/PLATELET    EKG EKG Interpretation  Date/Time:  Monday May 31 2022 11:24:24 EDT Ventricular Rate:  61 PR Interval:  168 QRS Duration: 144 QT Interval:  434 QTC Calculation: 436 R Axis:   108 Text Interpretation: Sinus rhythm with marked sinus arrhythmia Right bundle branch block Abnormal ECG When compared with ECG of 25-Apr-2016 10:51, PREVIOUS ECG IS PRESENT Confirmed by Regan Lemming ([id number]) on 05/31/2022 8:55:56 PM  Radiology CT Cervical Spine Wo Contrast  Result Date: 05/31/2022 CLINICAL DATA:  Neck pain, acute, prior cervical surgery EXAM: CT CERVICAL SPINE WITHOUT CONTRAST TECHNIQUE: Multidetector CT imaging of the cervical spine was performed without intravenous  contrast. Multiplanar CT image reconstructions were also generated. RADIATION DOSE REDUCTION: This exam was performed according to the departmental dose-optimization program which includes automated exposure control, adjustment of the mA and/or kV according to patient size and/or use of iterative reconstruction technique. COMPARISON:  Correlation is made with MRI of the cervical spine dated December 23, 2014 FINDINGS: Alignment: There is mild exaggeration of the cervical lordosis seen centered at C5-C6. Minimal retrolisthesis of C3 on C4. Skull base and vertebrae: There is no fracture seen. There has been interval ACDF with intervertebral disc cage at C4-C5. Moderate cervical spondylosis. Soft tissues and spinal canal: Soft tissues are unremarkable. Cervical spondylosis with calcification of the posterior longitudinal ligament and the lateral is most prominent at C3-C4 causing moderate flattening of the cervical cord. Disc levels: At C2-C3, posterior osteophytic spurring and facet hypertrophic changes cause mild narrowing of  the left neural foramen. At C3-C4, posterior osteophytic ridge, calcification of the posterior longitudinal ligament and facet hypertrophic changes cause severe narrowing of the neural foramina greater on the left with moderate spinal canal stenosis with flattening of the cervical cord. At C4-C5, there are post ACDF changes with intervertebral disc cage seen. Moderate facet hypertrophic changes no significant narrowing of the neural foramina. At C5-C6, posterior osteophytic ridge and facet hypertrophic changes cause mild-to-moderate narrowing of the neural foramina greater on the right. There is mild indentation of the ventral cervical cord seen of the posterior osteophytic ridge. At C6-C7, posterior osteophytic ridge with uncovertebral hypertrophic changes cause moderate narrowing of the neural foramina greater on the left. Likely abutment of the left ventral cervical cord. At C7-T1, posterior  osteophytic ridge and uncovertebral hypertrophic changes cause mild narrowing of the neural foramina greater on the left. Upper chest: Negative. Other: None. IMPRESSION: 1. There has been interval ACDF with intervertebral disc cage at C4-C5. 2.  No fracture or dislocation. 3. Moderate cervical spondylosis with prominent endplate osteophytosis, hypertrophy/calcification of the posterior longitudinal ligament and facet hypertrophic changes seen and are most prominent at C3-C4. At C3-C4, there is moderate spinal canal stenosis with flattening of the cervical cord, and severe narrowing narrowing of the neural foramina seen greater on the left. 4. Posterior osteophytic ridge, uncovertebral and facet hypertrophic changes cause mild-to-moderate at C5-C6, moderate at C6-C7 and mild at C7-T1 narrowing of the neural foramina. Electronically Signed   By: Frazier Richards M.D.   On: 05/31/2022 14:40    Procedures Procedures    Medications Ordered in ED Medications - No data to display  ED Course/ Medical Decision Making/ A&P                           Medical Decision Making This is a 76 year old male past medical history of CAD, hypertension, hyperlipidemia, cervical spine stenosis presenting to the emergency room with concerns of right arm pain and weakness.  Patient reports that he fell yesterday after his knee gave out.  He denies any head impact.  Has any loss of consciousness.  He reports this right arm pain and weakness started 3 months ago.  He reports that he saw his orthopedics doctor for this, who gave him muscle relaxers and pain meds.  He reports that this did not help, and therefore he presented to the emergency room.  On exam, patient has right upper extremity weakness as well as midline tenderness to thoracic region.  Patient does seem to have slowed speech, unsure if this is baseline or new.  Given that the patient did have a fall, I would like to rule out any stroke that may have caused this. CBC  unremarkable.  BMP unremarkable.  CT cervical spine showing interval ACDF with intervertebral disc cage at C4-C5.  Will obtain CT chest, CT T-spine, MRI brain, MRI cervical spine to evaluate for stroke or fractures and myelopathies.  Amount and/or Complexity of Data Reviewed Labs: ordered. Radiology: ordered.   2200: Patient care transferred to Dr. Armandina Gemma.  Please see his attestation for further MDM.           Final Clinical Impression(s) / ED Diagnoses Final diagnoses:  None    Rx / DC Orders ED Discharge Orders     None         Leigh Aurora, DO 05/31/22 2200    Regan Lemming, MD 06/01/22 1701

## 2022-06-01 ENCOUNTER — Emergency Department (HOSPITAL_COMMUNITY): Payer: Medicare Other

## 2022-06-01 DIAGNOSIS — M50222 Other cervical disc displacement at C5-C6 level: Secondary | ICD-10-CM | POA: Diagnosis not present

## 2022-06-01 DIAGNOSIS — R6889 Other general symptoms and signs: Secondary | ICD-10-CM | POA: Diagnosis not present

## 2022-06-01 DIAGNOSIS — Z7401 Bed confinement status: Secondary | ICD-10-CM | POA: Diagnosis not present

## 2022-06-01 DIAGNOSIS — I499 Cardiac arrhythmia, unspecified: Secondary | ICD-10-CM | POA: Diagnosis not present

## 2022-06-01 DIAGNOSIS — G319 Degenerative disease of nervous system, unspecified: Secondary | ICD-10-CM | POA: Diagnosis not present

## 2022-06-01 DIAGNOSIS — M4802 Spinal stenosis, cervical region: Secondary | ICD-10-CM | POA: Diagnosis not present

## 2022-06-01 DIAGNOSIS — F32A Depression, unspecified: Secondary | ICD-10-CM | POA: Diagnosis not present

## 2022-06-01 DIAGNOSIS — M4312 Spondylolisthesis, cervical region: Secondary | ICD-10-CM | POA: Diagnosis not present

## 2022-06-01 DIAGNOSIS — R519 Headache, unspecified: Secondary | ICD-10-CM | POA: Diagnosis not present

## 2022-06-01 DIAGNOSIS — Z743 Need for continuous supervision: Secondary | ICD-10-CM | POA: Diagnosis not present

## 2022-06-01 DIAGNOSIS — M5021 Other cervical disc displacement,  high cervical region: Secondary | ICD-10-CM | POA: Diagnosis not present

## 2022-06-01 NOTE — ED Notes (Signed)
Pt has keys to house with him

## 2022-06-01 NOTE — Discharge Instructions (Signed)
You were evaluated in the Emergency Department and after careful evaluation, we did not find any emergent condition requiring admission or further testing in the hospital.  Your exam/testing today is overall reassuring.  Recommend continued follow-up with your neck specialists to discuss her symptoms.  Please return to the Emergency Department if you experience any worsening of your condition.   Thank you for allowing [place] to be a part of your care.

## 2022-06-01 NOTE — ED Notes (Signed)
Pt returned from MRI, placed back on cardiac monitor.

## 2022-06-01 NOTE — ED Notes (Signed)
Patient transported to MRI 

## 2022-06-01 NOTE — ED Provider Notes (Signed)
  Provider Note MRN:  [id number]  Arrival date & time: 06/01/22    ED Course and Medical Decision Making  Assumed care from Dr. Armandina Gemma at shift change.  Fall, history of cervical spinal surgery, thinks maybe his right arm is weaker since the fall, awaiting MRI imaging.  If no emergent process, patient is a candidate for discharge.  Patient feeling better, arm working well, explains that the arm symptoms have been present for 2 or 3 months.  MRI imaging is without acute process.  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 Collins Scotland of radiology, this is a chronic finding, patient will follow-up with his neck specialist.  Procedures  Final Clinical Impressions(s) / ED Diagnoses     ICD-10-CM   1. Fall, initial encounter  W19.XXXA     2. Arm weakness  R29.898       ED Discharge Orders     None         Discharge Instructions      You were evaluated in the Emergency Department and after careful evaluation, we did not find any emergent condition requiring admission or further testing in the hospital.  Your exam/testing today is overall reassuring.  Recommend continued follow-up with your neck specialists to discuss her symptoms.  Please return to the Emergency Department if you experience any worsening of your condition.   Thank you for allowing [place] to be a part of your care.      Barth Kirks. Sedonia Small, [place] [email or web address]    Maudie Flakes, MD 06/01/22 [phone]

## 2022-06-02 ENCOUNTER — Ambulatory Visit: Payer: Medicare Other

## 2022-06-17 ENCOUNTER — Ambulatory Visit (INDEPENDENT_AMBULATORY_CARE_PROVIDER_SITE_OTHER): Payer: Medicare Other

## 2022-06-17 DIAGNOSIS — E538 Deficiency of other specified B group vitamins: Secondary | ICD-10-CM | POA: Diagnosis not present

## 2022-06-17 MED ORDER — CYANOCOBALAMIN 1000 MCG/ML IJ SOLN
1000.0000 ug | Freq: Once | INTRAMUSCULAR | Status: AC
  Administered 2022-06-17: 1000 ug via INTRAMUSCULAR

## 2022-06-17 NOTE — Progress Notes (Signed)
Per orders of Dr. Damita Dunnings, injection of B12 1000 mcg/ml given by Pilar Grammes, CMA in Right Deltoid. Patient tolerated injection well.  Discussed with pt and wife about scheduling his NV every 2 weeks for the next 3 months to get him back on a schedule. Explained his B12 levels will not go up if he is not taking them as directed. Wife was making appts while I gave him his injection. Advised if he stays on track, we will retest in 3 months to see how he is doing.  He also asked me how he should take Tylenol and the new tramadol he just received. I wrote down a schedule for him: 8am Tylenol, 12pm Tramadol, 4pm Tylenol, 8pm Tramadol. I reminded him these are as needed, so if he finds he does not need one of them at that time, that would be best.

## 2022-06-25 DIAGNOSIS — M542 Cervicalgia: Secondary | ICD-10-CM | POA: Diagnosis not present

## 2022-06-30 DIAGNOSIS — M542 Cervicalgia: Secondary | ICD-10-CM | POA: Diagnosis not present

## 2022-07-01 ENCOUNTER — Telehealth: Payer: Self-pay | Admitting: Family Medicine

## 2022-07-01 NOTE — Telephone Encounter (Signed)
Patient called in to let Dr Damita Dunnings know that his orthopedic doctor told him to stop taking medications Gabapentin,Meloxicam,and Losartan. He said that  it has bad side effects.

## 2022-07-01 NOTE — Addendum Note (Signed)
Addended by: Sherrilee Gilles B on: 07/01/2022 04:44 PM   Modules accepted: Orders

## 2022-07-01 NOTE — Telephone Encounter (Signed)
Spoke with patient and he states he will call back to make f/u appt. I have updated his med list and also sent records release form to [facility] for ov notes.

## 2022-07-01 NOTE — Telephone Encounter (Signed)
Please get a copy of his ortho notes.  He'll need to schedule f/u here about his BP if off losartan.  I'll defer to ortho/patient to manage the pain [id number] and meloxicam.

## 2022-07-02 ENCOUNTER — Ambulatory Visit (INDEPENDENT_AMBULATORY_CARE_PROVIDER_SITE_OTHER): Payer: Medicare Other

## 2022-07-02 ENCOUNTER — Other Ambulatory Visit (INDEPENDENT_AMBULATORY_CARE_PROVIDER_SITE_OTHER): Payer: Self-pay | Admitting: Vascular Surgery

## 2022-07-02 DIAGNOSIS — M542 Cervicalgia: Secondary | ICD-10-CM

## 2022-07-02 DIAGNOSIS — I6502 Occlusion and stenosis of left vertebral artery: Secondary | ICD-10-CM

## 2022-07-02 DIAGNOSIS — E538 Deficiency of other specified B group vitamins: Secondary | ICD-10-CM | POA: Diagnosis not present

## 2022-07-02 MED ORDER — CYANOCOBALAMIN 1000 MCG/ML IJ SOLN
1000.0000 ug | Freq: Once | INTRAMUSCULAR | Status: AC
  Administered 2022-07-02: 1000 ug via INTRAMUSCULAR

## 2022-07-02 NOTE — Progress Notes (Signed)
Per orders of Dr. Elsie Stain, injection of vitamin B 12 given in left deltoid given by Ozzie Hoyle. Patient tolerated injection well.

## 2022-07-05 ENCOUNTER — Ambulatory Visit (INDEPENDENT_AMBULATORY_CARE_PROVIDER_SITE_OTHER): Payer: Medicare Other

## 2022-07-05 ENCOUNTER — Encounter (INDEPENDENT_AMBULATORY_CARE_PROVIDER_SITE_OTHER): Payer: Self-pay | Admitting: Vascular Surgery

## 2022-07-05 ENCOUNTER — Ambulatory Visit (INDEPENDENT_AMBULATORY_CARE_PROVIDER_SITE_OTHER): Payer: Medicare Other | Admitting: Vascular Surgery

## 2022-07-05 VITALS — BP 151/80 | HR 65 | Resp 17 | Ht 68.0 in | Wt 186.0 lb

## 2022-07-05 DIAGNOSIS — M542 Cervicalgia: Secondary | ICD-10-CM

## 2022-07-05 DIAGNOSIS — E785 Hyperlipidemia, unspecified: Secondary | ICD-10-CM

## 2022-07-05 DIAGNOSIS — I10 Essential (primary) hypertension: Secondary | ICD-10-CM | POA: Diagnosis not present

## 2022-07-05 DIAGNOSIS — I2510 Atherosclerotic heart disease of native coronary artery without angina pectoris: Secondary | ICD-10-CM

## 2022-07-05 DIAGNOSIS — I6502 Occlusion and stenosis of left vertebral artery: Secondary | ICD-10-CM

## 2022-07-05 NOTE — Progress Notes (Signed)
MRN : [id number]  Matthew Norton is a 76 y.o. (11/10/1945) male who presents with chief complaint of check carotid arteries.  History of Present Illness:   The patient is referred to the office by [facility] after an MRI of the cervical spine suggested flow abnormality in the carotid vertebral arterial system.  The patient denies amaurosis fugax. There is no recent history of TIA symptoms or focal motor deficits. There is no prior documented CVA.  There is no history of migraine headaches. There is no history of seizures.  The patient is taking enteric-coated aspirin 81 mg daily.  No recent shortening of the patient's walking distance or new symptoms consistent with claudication.  No history of rest pain symptoms. No new ulcers or wounds of the lower extremities have occurred.  There is no history of DVT, PE or superficial thrombophlebitis. No recent episodes of angina or shortness of breath documented.    Duplex ultrasound of the carotid arteries done today shows RICA 9-51% and LICA 8-84%.  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  No outpatient medications have been marked as taking for the 07/05/22 encounter (Office Visit) with Delana Meyer, Dolores Lory, MD.    Past Medical History:  Diagnosis Date   Anxiety    takes Valium daily as needed   Arthritis    Cataracts, bilateral    Complication of anesthesia    slow to wake up after one surgery   Coronary artery disease    Depression    Hyperlipemia    has tried meds but not on any bc of joint aches and pains   Hypertension    takes Metoprolol and Losartan daily   Insomnia    Joint pain    Peripheral edema    Shortness of breath    with exertion    Weakness    numbness in both hands and feet.    Past Surgical History:  Procedure Laterality Date   ANTERIOR CERVICAL DECOMP/DISCECTOMY FUSION N/A 03/24/2015   Procedure: Cervical four- five, anterior cervical decompression with fusion, plating and  bonegraft;  Surgeon: Jovita Gamma, MD;  Location: [place] [facility];  Service: Neurosurgery;  Laterality: N/A;  C45 anterior cervical decompression with fusion plating and bonegraft   BACK SURGERY  2001   lumb lam   CARDIAC CATHETERIZATION  08,11   stents x2   [place]   fx rt    COLONOSCOPY WITH PROPOFOL N/A 03/31/2017   Procedure: COLONOSCOPY WITH PROPOFOL;  Surgeon: Jonathon Bellows, MD;  Location: [facility] ENDOSCOPY;  Service: Endoscopy;  Laterality: N/A;   COLONOSCOPY WITH PROPOFOL N/A 08/25/2017   Procedure: COLONOSCOPY WITH PROPOFOL;  Surgeon: Jonathon Bellows, MD;  Location[facility]COPY;  Service: Endoscopy;  Laterality: N/A;   COLONOSCOPY WITH PROPOFOL N/A 03/22/2022   Procedure: COLONOSCOPY WITH PROPOFOL;  Surgeon: Jonathon Bellows, MD;  Location: [facility] ENDOSCOPY;  Service: Gastroenterology;  Laterality: N/A;   CORONARY ANGIOPLASTY     2 stents   ESOPHAGOGASTRODUODENOSCOPY     HERNIA REPAIR  2007   rt and lt ing    NAILBED REPAIR Left 09/13/2013   Procedure: LEFT THUMB NAIL REMOVAL, NAIL BED RECONSTRUCTION PARTIAL EXCISION DISTAL PHALANX WITH COMPLEX RECONSTRUCTION ;  Surgeon: Roseanne Kaufman, MD;  Location: [place];  Service: Orthopedics;  Laterality: Left;   parotid mass removed     REPLANTATION THUMB  2003   left    Social History Social History   Tobacco Use  Smoking status: Every Day    Packs/day: 1.00    Years: 50.00    Total pack years: 50.00    Types: Cigarettes   Smokeless tobacco: Never  Vaping Use   Vaping Use: Never used  Substance Use Topics   Alcohol use: No    Alcohol/week: 0.0 standard drinks of alcohol   Drug use: No    Family History Family History  Problem Relation Age of Onset   Hypertension Mother    Cancer Mother    Cancer Father    Colon cancer Father    Prostate cancer Father     Allergies  Allergen Reactions   Bupropion Other (See Comments)   Hydrochlorothiazide Other (See Comments)    Unspecified   Paroxetine Hcl  Other (See Comments)    Unspecified   Venlafaxine Other (See Comments)    Unspecified   Flexeril [Cyclobenzaprine] Other (See Comments)    Likely sedation reaction   Gabapentin Other (See Comments)    Headache,weakness   Hydroxyzine Other (See Comments)    Lack of effect for anxiety.    Lipitor [Atorvastatin] Other (See Comments)    aches   Pravastatin Other (See Comments)    nightmares     REVIEW OF SYSTEMS (Negative unless checked)  Constitutional: '[]'$ Weight loss  '[]'$ Fever  '[]'$ Chills Cardiac: '[]'$ Chest pain   '[]'$ Chest pressure   '[]'$ Palpitations   '[]'$ Shortness of breath when laying flat   '[]'$ Shortness of breath with exertion. Vascular:  '[x]'$ Pain in legs with walking   '[]'$ Pain in legs at rest  '[]'$ History of DVT   '[]'$ Phlebitis   '[]'$ Swelling in legs   '[]'$ Varicose veins   '[]'$ Non-healing ulcers Pulmonary:   '[]'$ Uses home oxygen   '[]'$ Productive cough   '[]'$ Hemoptysis   '[]'$ Wheeze  '[]'$ COPD   '[]'$ Asthma Neurologic:  '[]'$ Dizziness   '[]'$ Seizures   '[]'$ History of stroke   '[]'$ History of TIA  '[]'$ Aphasia   '[]'$ Vissual changes   '[]'$ Weakness or numbness in arm   '[]'$ Weakness or numbness in leg Musculoskeletal:   '[]'$ Joint swelling   '[x]'$ Joint pain   '[]'$ Low back pain Hematologic:  '[]'$ Easy bruising  '[]'$ Easy bleeding   '[]'$ Hypercoagulable state   '[]'$ Anemic Gastrointestinal:  '[]'$ Diarrhea   '[]'$ Vomiting  '[]'$ Gastroesophageal reflux/heartburn   '[]'$ Difficulty swallowing. Genitourinary:  '[]'$ Chronic kidney disease   '[]'$ Difficult urination  '[]'$ Frequent urination   '[]'$ Blood in urine Skin:  '[]'$ Rashes   '[]'$ Ulcers  Psychological:  '[]'$ History of anxiety   '[]'$  History of major depression.  Physical Examination  Vitals:   07/05/22 1125  BP: (!) 151/80  Pulse: 65  Resp: 17  Weight: 186 lb (84.4 kg)  Height: '5\' 8"'$  (1.727 m)   Body mass index is 28.28 kg/m. Gen: WD/WN, NAD Head: [place]/AT, No temporalis wasting.  Ear/Nose/Throat: Hearing grossly intact, nares w/o erythema or drainage Eyes: PER, EOMI, sclera nonicteric.  Neck: Supple, no masses.  No bruit or JVD.   Pulmonary:  Good air movement, no audible wheezing, no use of accessory muscles.  Cardiac: RRR, normal S1, S2, no Murmurs. Vascular:  No carotid bruit noted Vessel Right Left  Radial Palpable Palpable  Carotid  Palpable  Palpable  Subclav  Palpable Palpable  Gastrointestinal: soft, non-distended. No guarding/no peritoneal signs.  Musculoskeletal: M/S 5/5 throughout.  No visible deformity.  Neurologic: CN 2-12 intact. Pain and light touch intact in extremities.  Symmetrical.  Speech is fluent. Motor exam as listed above. Psychiatric: Judgment intact, Mood & affect appropriate for pt's clinical situation. Dermatologic: No rashes or ulcers noted.  No changes consistent with cellulitis.  CBC Lab Results  Component Value Date   WBC 5.2 05/31/2022   HGB 16.2 05/31/2022   HCT 48.2 05/31/2022   MCV 96.6 05/31/2022   PLT 178 05/31/2022    BMET    Component Value Date/Time   NA 137 05/31/2022 1420   K 4.7 05/31/2022 1420   CL 103 05/31/2022 1420   CO2 26 05/31/2022 1420   GLUCOSE 87 05/31/2022 1420   BUN 9 05/31/2022 1420   CREATININE 1.18 05/31/2022 1420   CALCIUM 9.8 05/31/2022 1420   GFRNONAA >60 05/31/2022 1420   GFRAA >60 04/25/2016 1057   CrCl cannot be calculated (Patient's most recent lab result is older than the maximum 21 days allowed.).  COAG No results found for: "INR", "PROTIME"  Radiology No results found.   Assessment/Plan 1. Obstruction of left vertebral artery Recommend:  Given the patient's asymptomatic subcritical stenosis no further invasive testing or surgery at this time.  Duplex ultrasound of the carotid arteries done today shows RICA 9-47% and LICA 0-96%.  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  Continue antiplatelet therapy as prescribed Continue management of CAD, HTN and Hyperlipidemia Healthy heart diet,  encouraged exercise at least 4 times per week.  Follow up in 12 months with duplex ultrasound and physical  exam.    - VAS US CAROTID - VAS US CAROTID; Future  2. Cervicalgia Continue NSAID medications as already ordered, these medications have been reviewed and there are no changes at this time.  Continued activity and therapy was stressed.  - VAS US CAROTID  3. CAD in native artery Continue cardiac and antihypertensive medications as already ordered and reviewed, no changes at this time.  Continue statin as ordered and reviewed, no changes at this time  Nitrates PRN for chest pain   4. Primary hypertension Continue antihypertensive medications as already ordered, these medications have been reviewed and there are no changes at this time.   5. Hyperlipidemia, unspecified hyperlipidemia type Continue statin as ordered and reviewed, no changes at this time      Hortencia Pilar, MD  07/05/2022 11:26 AM

## 2022-07-08 ENCOUNTER — Telehealth: Payer: Self-pay

## 2022-07-08 NOTE — Telephone Encounter (Signed)
Patient is in need of a F/u on BP; please call to schedule appt.

## 2022-07-08 NOTE — Telephone Encounter (Signed)
Patient has been scheduled

## 2022-07-16 ENCOUNTER — Encounter: Payer: Self-pay | Admitting: Family Medicine

## 2022-07-16 ENCOUNTER — Ambulatory Visit (INDEPENDENT_AMBULATORY_CARE_PROVIDER_SITE_OTHER): Payer: Medicare Other | Admitting: Family Medicine

## 2022-07-16 VITALS — BP 140/80 | HR 78 | Temp 98.6°F | Ht 67.0 in | Wt 187.1 lb

## 2022-07-16 DIAGNOSIS — R21 Rash and other nonspecific skin eruption: Secondary | ICD-10-CM

## 2022-07-16 DIAGNOSIS — M4802 Spinal stenosis, cervical region: Secondary | ICD-10-CM | POA: Diagnosis not present

## 2022-07-16 DIAGNOSIS — E538 Deficiency of other specified B group vitamins: Secondary | ICD-10-CM | POA: Diagnosis not present

## 2022-07-16 DIAGNOSIS — F17200 Nicotine dependence, unspecified, uncomplicated: Secondary | ICD-10-CM

## 2022-07-16 LAB — VITAMIN B12: Vitamin B-12: 386 pg/mL (ref 211–911)

## 2022-07-16 NOTE — Progress Notes (Signed)
We talked about options for smoking cessation.  Would defer chantix and wellbutrin given his hx.  Smoking about 3/4 PPD.  D/w pt about options and nicotine gum may be the best option.  Rationale d/w pt.    Still on B12 replacement.  Follow-up B12 level pending.  He had been taking tylenol 3 times a day and that worked better than other meds.  His sleep is better in the meantime. He still has R arm pain.   D/w pt about prev imaging.    IMPRESSION: 1. No acute intracranial abnormality. 2. Multifocal chronic ischemic white matter changes. 3. 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 4. 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 5. Unchanged left C6-7 neural foraminal stenosis.  Patient reports he had seen Dr. Kayleen Memos but I need that OV noted.    Mult ant bites on the feet.  Stepped on an ant hill by accident.  No pain now but local swelling and irritation.  Meds, vitals, and allergies reviewed.   ROS: Per HPI unless specifically indicated in ROS section   Nad Ncat Neck supple, no LA Rrr Ctab Abd soft, not ttp Extremities with trace edema. He has local punctate areas of irritation on the dorsum of the feet bilaterally from reported ant bites without ulceration.  30 minutes were devoted to patient care in this encounter (this includes time spent reviewing the patient's file/history, interviewing and examining the patient, counseling/reviewing plan with patient).

## 2022-07-16 NOTE — Patient Instructions (Addendum)
Flavored nicotine gum may be the best option to use as needed.  Let me know how it goes.    Let me check with Dr. Kayleen Memos in the meantime.  Keep taking tylenol in the meantime.    Go to the lab on the way out.   If you have mychart we'll likely use that to update you.    Take care.  Glad to see you.

## 2022-07-18 ENCOUNTER — Telehealth: Payer: Self-pay | Admitting: Family Medicine

## 2022-07-18 DIAGNOSIS — R21 Rash and other nonspecific skin eruption: Secondary | ICD-10-CM | POA: Insufficient documentation

## 2022-07-18 NOTE — Assessment & Plan Note (Signed)
I need update from Dr. Kayleen Memos.  See following phone note.  MRI results especially spinal canal stenosis and neuroforaminal stenosis discussed with patient.  Would continue Tylenol for now.

## 2022-07-18 NOTE — Assessment & Plan Note (Signed)
See notes on labs. 

## 2022-07-18 NOTE — Assessment & Plan Note (Signed)
Discussed using nicotine gum and he can update me as needed.

## 2022-07-18 NOTE — Telephone Encounter (Signed)
I need update from Dr. Kayleen Memos about the plan going forward regarding the patient's abnormal MRI and neck/arm symptoms.  Please see about getting a copy of the most recent note from Dr. Liliana Cline clinic.  Thanks.

## 2022-07-18 NOTE — Assessment & Plan Note (Signed)
Related to ant bites, improving per patient report, should resolve.

## 2022-07-20 NOTE — Telephone Encounter (Signed)
Records request has been sent to [facility].

## 2022-07-26 ENCOUNTER — Other Ambulatory Visit: Payer: Self-pay | Admitting: Family Medicine

## 2022-07-26 NOTE — Telephone Encounter (Signed)
Refill request for diazePAM 5 MG Oral Tablet  LOV - 07/16/22 Next OV - not scheduled Last refill - 04/27/22 #90/0

## 2022-07-27 ENCOUNTER — Telehealth: Payer: Self-pay

## 2022-07-27 ENCOUNTER — Ambulatory Visit (INDEPENDENT_AMBULATORY_CARE_PROVIDER_SITE_OTHER): Payer: Medicare Other

## 2022-07-27 DIAGNOSIS — E538 Deficiency of other specified B group vitamins: Secondary | ICD-10-CM | POA: Diagnosis not present

## 2022-07-27 MED ORDER — CYANOCOBALAMIN 1000 MCG/ML IJ SOLN
1000.0000 ug | Freq: Once | INTRAMUSCULAR | Status: AC
  Administered 2022-07-29: 1000 ug via INTRAMUSCULAR

## 2022-07-27 NOTE — Progress Notes (Cosign Needed Addendum)
Per orders of Dr. Elsie Stain, injection of B-12  given by Francella Solian in right deltoid. Patient tolerated injection well. Patient unsure of when next one soul be scheduled. Message sent to PCP to get clarification.

## 2022-07-27 NOTE — Telephone Encounter (Signed)
Patient came in office today for b-12. Wanted to follow up if you had talked with Dr. Arbie Cookey. I have spoke to [place] she has requested notes but not sure if you had received them.   Patient also wanted to know when he needs to set up next b 12.

## 2022-07-28 NOTE — Telephone Encounter (Signed)
Patient returned phone call. Would like a call back.

## 2022-07-28 NOTE — Telephone Encounter (Signed)
I haven't seen the report yet from outside clinic.  B12 every 14 days.  Thanks.

## 2022-07-28 NOTE — Telephone Encounter (Signed)
LMTCB

## 2022-07-29 DIAGNOSIS — E538 Deficiency of other specified B group vitamins: Secondary | ICD-10-CM | POA: Diagnosis not present

## 2022-07-29 NOTE — Telephone Encounter (Signed)
Spoke with patient and scheduled next b12 injection for 08/10/22 at 9:30 am. Patient is seeing Dr. Kayleen Memos again tomorrow and I advised to have him please send [place] the notes from last visit and [place] visit. Patient stated he will ask him to do so while there.

## 2022-07-30 DIAGNOSIS — M542 Cervicalgia: Secondary | ICD-10-CM | POA: Diagnosis not present

## 2022-08-04 DIAGNOSIS — M5412 Radiculopathy, cervical region: Secondary | ICD-10-CM | POA: Diagnosis not present

## 2022-08-05 ENCOUNTER — Telehealth: Payer: Self-pay | Admitting: Family Medicine

## 2022-08-05 NOTE — Telephone Encounter (Signed)
Duly noted, I hope he gets relief from that.  Thanks.

## 2022-08-05 NOTE — Telephone Encounter (Signed)
Mr Matthew Norton wanted to let Dr Damita Dunnings know that he received his spinal shot on yesterday.

## 2022-08-10 ENCOUNTER — Ambulatory Visit (INDEPENDENT_AMBULATORY_CARE_PROVIDER_SITE_OTHER): Payer: Medicare Other

## 2022-08-10 DIAGNOSIS — E538 Deficiency of other specified B group vitamins: Secondary | ICD-10-CM

## 2022-08-10 MED ORDER — CYANOCOBALAMIN 1000 MCG/ML IJ SOLN
1000.0000 ug | Freq: Once | INTRAMUSCULAR | Status: AC
  Administered 2022-08-10: 1000 ug via INTRAMUSCULAR

## 2022-08-10 NOTE — Progress Notes (Signed)
Patient presented for B 12 injection given by Kacy Conely, CMA to left deltoid, patient voiced no concerns nor showed any signs of distress during injection.  

## 2022-08-11 ENCOUNTER — Ambulatory Visit (INDEPENDENT_AMBULATORY_CARE_PROVIDER_SITE_OTHER): Payer: Medicare Other | Admitting: *Deleted

## 2022-08-11 ENCOUNTER — Telehealth: Payer: Self-pay

## 2022-08-11 DIAGNOSIS — Z0000 Encounter for general adult medical examination without abnormal findings: Secondary | ICD-10-CM

## 2022-08-11 NOTE — Patient Instructions (Signed)
[name] , Thank you for taking time to come for your Medicare Wellness Visit. I appreciate your ongoing commitment to your health goals. Please review the following plan we discussed and let me know if I can assist you in the future.   These are the goals we discussed:  Goals      Increase water intake     Starting 08/03/2018, I will attempt to drink at least 6-8 glasses of water daily.      Patient Stated     Would like to walk again and be more independent.      Patient Stated     No goals        This is a list of the screening recommended for you and due dates:  Health Maintenance  Topic Date Due   Tetanus Vaccine  10/11/2020   COVID-19 Vaccine (3 - Moderna series) 08/27/2022*   Zoster (Shingles) Vaccine (1 of 2) 11/11/2022*   Flu Shot  01/09/2023*   Screening for Lung Cancer  06/01/2023   Medicare Annual Wellness Visit  08/12/2023   Colon Cancer Screening  03/22/2032   Pneumonia Vaccine  Completed   Hepatitis C Screening: USPSTF Recommendation to screen - Ages 18-79 yo.  Completed   HPV Vaccine  Aged Out  *Topic was postponed. The date shown is not the original due date.    Advanced directives: Education provided  Conditions/risks identified:    Preventive Care 45 Years and Older, Male  Preventive care refers to lifestyle choices and visits with your health care provider that can promote health and wellness. What does preventive care include? A yearly physical exam. This is also called an annual well check. Dental exams once or twice a year. Routine eye exams. Ask your health care provider how often you should have your eyes checked. Personal lifestyle choices, including: Daily care of your teeth and gums. Regular physical activity. Eating a healthy diet. Avoiding tobacco and drug use. Limiting alcohol use. Practicing safe sex. Taking low doses of aspirin every day. Taking vitamin and mineral supplements as recommended by your health care provider. What  happens during an annual well check? The services and screenings done by your health care provider during your annual well check will depend on your age, overall health, lifestyle risk factors, and family history of disease. Counseling  Your health care provider may ask you questions about your: Alcohol use. Tobacco use. Drug use. Emotional well-being. Home and relationship well-being. Sexual activity. Eating habits. History of falls. Memory and ability to understand (cognition). Work and work Statistician. Screening  You may have the following tests or measurements: Height, weight, and BMI. Blood pressure. Lipid and cholesterol levels. These may be checked every 5 years, or more frequently if you are over 33 years old. Skin check. Lung cancer screening. You may have this screening every year starting at age 12 if you have a 30-pack-year history of smoking and currently smoke or have quit within the past 15 years. Fecal occult blood test (FOBT) of the stool. You may have this test every year starting at age 72. Flexible sigmoidoscopy or colonoscopy. You may have a sigmoidoscopy every 5 years or a colonoscopy every 10 years starting at age 74. Prostate cancer screening. Recommendations will vary depending on your family history and other risks. Hepatitis C blood test. Hepatitis B blood test. Sexually transmitted disease (STD) testing. Diabetes screening. This is done by checking your blood sugar (glucose) after you have not eaten for a while (fasting).  You may have this done every 1-3 years. Abdominal aortic aneurysm (AAA) screening. You may need this if you are a current or former smoker. Osteoporosis. You may be screened starting at age 38 if you are at high risk. Talk with your health care provider about your test results, treatment options, and if necessary, the need for more tests. Vaccines  Your health care provider may recommend certain vaccines, such as: Influenza vaccine. This  is recommended every year. Tetanus, diphtheria, and acellular pertussis (Tdap, Td) vaccine. You may need a Td booster every 10 years. Zoster vaccine. You may need this after age 29. Pneumococcal 13-valent conjugate (PCV13) vaccine. One dose is recommended after age 72. Pneumococcal polysaccharide (PPSV23) vaccine. One dose is recommended after age 71. Talk to your health care provider about which screenings and vaccines you need and how often you need them. This information is not intended to replace advice given to you by your health care provider. Make sure you discuss any questions you have with your health care provider. Document Released: 10/24/2015 Document Revised: 06/16/2016 Document Reviewed: 07/29/2015 Elsevier Interactive Patient Education  2017 [place] Prevention in the Home Falls can cause injuries. They can happen to people of all ages. There are many things you can do to make your home safe and to help prevent falls. What can I do on the outside of my home? Regularly fix the edges of walkways and driveways and fix any cracks. Remove anything that might make you trip as you walk through a door, such as a raised step or threshold. Trim any bushes or trees on the path to your home. Use bright outdoor lighting. Clear any walking paths of anything that might make someone trip, such as rocks or tools. Regularly check to see if handrails are loose or broken. Make sure that both sides of any steps have handrails. Any raised decks and porches should have guardrails on the edges. Have any leaves, snow, or ice cleared regularly. Use sand or salt on walking paths during winter. Clean up any spills in your garage right away. This includes oil or grease spills. What can I do in the bathroom? Use night lights. Install grab bars by the toilet and in the tub and shower. Do not use towel bars as grab bars. Use non-skid mats or decals in the tub or shower. If you need to sit down  in the shower, use a plastic, non-slip stool. Keep the floor dry. Clean up any water that spills on the floor as soon as it happens. Remove soap buildup in the tub or shower regularly. Attach bath mats securely with double-sided non-slip rug tape. Do not have throw rugs and other things on the floor that can make you trip. What can I do in the bedroom? Use night lights. Make sure that you have a light by your bed that is easy to reach. Do not use any sheets or blankets that are too big for your bed. They should not hang down onto the floor. Have a firm chair that has side arms. You can use this for support while you get dressed. Do not have throw rugs and other things on the floor that can make you trip. What can I do in the kitchen? Clean up any spills right away. Avoid walking on wet floors. Keep items that you use a lot in easy-to-reach places. If you need to reach something above you, use a strong step stool that has a grab bar. Keep  electrical cords out of the way. Do not use floor polish or wax that makes floors slippery. If you must use wax, use non-skid floor wax. Do not have throw rugs and other things on the floor that can make you trip. What can I do with my stairs? Do not leave any items on the stairs. Make sure that there are handrails on both sides of the stairs and use them. Fix handrails that are broken or loose. Make sure that handrails are as long as the stairways. Check any carpeting to make sure that it is firmly attached to the stairs. Fix any carpet that is loose or worn. Avoid having throw rugs at the top or bottom of the stairs. If you do have throw rugs, attach them to the floor with carpet tape. Make sure that you have a light switch at the top of the stairs and the bottom of the stairs. If you do not have them, ask someone to add them for you. What else can I do to help prevent falls? Wear shoes that: Do not have high heels. Have rubber bottoms. Are comfortable  and fit you well. Are closed at the toe. Do not wear sandals. If you use a stepladder: Make sure that it is fully opened. Do not climb a closed stepladder. Make sure that both sides of the stepladder are locked into place. Ask someone to hold it for you, if possible. Clearly mark and make sure that you can see: Any grab bars or handrails. First and last steps. Where the edge of each step is. Use tools that help you move around (mobility aids) if they are needed. These include: Canes. Walkers. Scooters. Crutches. Turn on the lights when you go into a dark area. Replace any light bulbs as soon as they burn out. Set up your furniture so you have a clear path. Avoid moving your furniture around. If any of your floors are uneven, fix them. If there are any pets around you, be aware of where they are. Review your medicines with your doctor. Some medicines can make you feel dizzy. This can increase your chance of falling. Ask your doctor what other things that you can do to help prevent falls. This information is not intended to replace advice given to you by your health care provider. Make sure you discuss any questions you have with your health care provider. Document Released: 07/24/2009 Document Revised: 03/04/2016 Document Reviewed: 11/01/2014 Elsevier Interactive Patient Education  2017 [employer].

## 2022-08-11 NOTE — Telephone Encounter (Signed)
   Telephone encounter was:  Successful.  08/11/2022 Name: [name] MRN: [id number] DOB: [birth year]  Matthew Norton is a 76 y.o. year old male who is a primary care patient of Tonia Ghent, MD . The community resource team was consulted for assistance with  Utilities  Care guide performed the following interventions: Patient provided with information about care guide support team and interviewed to confirm resource needs.Patient needs assistance with oil bill. Patient is not behind but cant afford to pay the next delivery because he just paid $700 and it will not last if it stays cold. A referral in [place] has been put in as well as I have mailed additional resources for him to reach out to.  Follow Up Plan:  No further follow up planned at this time. The patient has been provided with needed resources.    [facility]  [phone] [address], [place], [place] [address] Phone: [phone] Email: [email or web address]

## 2022-08-11 NOTE — Progress Notes (Signed)
Subjective:   Matthew Norton is a 76 y.o. male who presents for Medicare Annual/Subsequent preventive examination.  I connected with  Matthew Norton on 08/11/22 by a telephone enabled telemedicine application and verified that I am speaking with the correct person using two identifiers.   I discussed the limitations of evaluation and management by telemedicine. The patient expressed understanding and agreed to proceed.  Patient location: home  Provider location:  Tele-health-home    Review of Systems     Cardiac Risk Factors include: advanced age (>39mn, >>65women);hypertension;male gender;sedentary lifestyle;obesity (BMI >30kg/m2);smoking/ tobacco exposure     Objective:    Today's Vitals   08/11/22 1307  PainSc: 5    There is no height or weight on file to calculate BMI.     08/11/2022    1:20 PM 08/11/2022    1:11 PM 05/31/2022   11:26 AM 03/22/2022    9:21 AM 08/06/2021    1:30 PM 08/03/2018   11:47 AM 08/25/2017    7:34 AM  Advanced Directives  Does Patient Have a Medical Advance Directive? No No No No No No No  Would patient like information on creating a medical advance directive?  No - Patient declined No - Patient declined  Yes (MAU/Ambulatory/Procedural Areas - Information given) No - Patient declined No - Patient declined    Current Medications (verified) Outpatient Encounter Medications as of 08/11/2022  Medication Sig   acetaminophen (TYLENOL) 500 MG tablet Take 2 tablets (1,000 mg total) by mouth every 8 (eight) hours as needed (for pain).   aspirin 81 MG tablet Take 81 mg by mouth daily.   cyanocobalamin (,VITAMIN B-12,) 1000 MCG/ML injection 1000 mcg IM every 14 days   diazepam (VALIUM) 5 MG tablet TAKE 1/2 TO 1 (ONE-HALF TO ONE) TABLET BY MOUTH ONCE DAILY IN THE MORNING AND 1 TO 2 TABLETS BY MOUTH NIGHTLY-MAX OF 3 TABLETS PER DAY   fluticasone (FLONASE) 50 MCG/ACT nasal spray Place 2 sprays into both nostrils daily. For allergies   loratadine (CLARITIN) 10  MG tablet Take 10 mg by mouth daily.   losartan (COZAAR) 25 MG tablet Take 25 mg by mouth daily.   nitroGLYCERIN (NITROSTAT) 0.4 MG SL tablet Place 1 tablet (0.4 mg total) under the tongue every 5 (five) minutes as needed for chest pain. Max 3 doses in 15 minutes.   OVER THE COUNTER MEDICATION Beta Prostate, take one by mouth daily   No facility-administered encounter medications on file as of 08/11/2022.    Allergies (verified) Methocarbamol, Bupropion, Hydrochlorothiazide, Paroxetine hcl, Tramadol hcl, Venlafaxine, Flexeril [cyclobenzaprine], Gabapentin, Hydroxyzine, Lipitor [atorvastatin], and Pravastatin   History: Past Medical History:  Diagnosis Date   Anxiety    takes Valium daily as needed   Arthritis    Cataracts, bilateral    Complication of anesthesia    slow to wake up after one surgery   Coronary artery disease    Depression    Hyperlipemia    has tried meds but not on any bc of joint aches and pains   Hypertension    takes Metoprolol and Losartan daily   Insomnia    Joint pain    Peripheral edema    Shortness of breath    with exertion    Weakness    numbness in both hands and feet.   Past Surgical History:  Procedure Laterality Date   ANTERIOR CERVICAL DECOMP/DISCECTOMY FUSION N/A 03/24/2015   Procedure: Cervical four- five, anterior cervical decompression with fusion, plating  and bonegraft;  Surgeon: Jovita Gamma, MD;  Location: [facility];  Service: Neurosurgery;  Laterality: N/A;  C45 anterior cervical decompression with fusion plating and bonegraft   BACK SURGERY  2001   lumb lam   CARDIAC CATHETERIZATION  08,11   stents x2   [place]   fx rt    COLONOSCOPY WITH PROPOFOL N/A 03/31/2017   Procedure: COLONOSCOPY WITH PROPOFOL;  Surgeon: Jonathon Bellows, MD;  Location: [facility] ENDOSCOPY;  Service: Endoscopy;  Laterality: N/A;   COLONOSCOPY WITH PROPOFOL N/A 08/25/2017   Procedure: COLONOSCOPY WITH PROPOFOL;  Surgeon: Jonathon Bellows, MD;  Location:  [facility] ENDOSCOPY;  Service: Endoscopy;  Laterality: N/A;   COLONOSCOPY WITH PROPOFOL N/A 03/22/2022   Procedure: COLONOSCOPY WITH PROPOFOL;  Surgeon: Jonathon Bellows, MD;  Location: [facility] ENDOSCOPY;  Service: Gastroenterology;  Laterality: N/A;   CORONARY ANGIOPLASTY     2 stents   ESOPHAGOGASTRODUODENOSCOPY     HERNIA REPAIR  2007   rt and lt ing    NAILBED REPAIR Left 09/13/2013   Procedure: LEFT THUMB NAIL REMOVAL, NAIL BED RECONSTRUCTION PARTIAL EXCISION DISTAL PHALANX WITH COMPLEX RECONSTRUCTION ;  Surgeon: Roseanne Kaufman, MD;  Location: [place];  Service: Orthopedics;  Laterality: Left;   parotid mass removed     REPLANTATION THUMB  2003   left   Family History  Problem Relation Age of Onset   Hypertension Mother    Cancer Mother    Cancer Father    Colon cancer Father    Prostate cancer Father    Social History   Socioeconomic History   Marital status: Single    Spouse name: Not on file   Number of children: Not on file   Years of education: Not on file   Highest education level: Not on file  Occupational History   Not on file  Tobacco Use   Smoking status: Every Day    Packs/day: 1.00    Years: 50.00    Total pack years: 50.00    Types: Cigarettes   Smokeless tobacco: Never  Vaping Use   Vaping Use: Never used  Substance and Sexual Activity   Alcohol use: No    Alcohol/week: 0.0 standard drinks of alcohol   Drug use: No   Sexual activity: Never  Other Topics Concern   Not on file  Social History Narrative   2 kids   In relationship with long term girlfriend   Social Determinants of Health   Financial Resource Strain: Medium Risk (08/11/2022)   Overall Financial Resource Strain (CARDIA)    Difficulty of Paying Living Expenses: Somewhat hard  Food Insecurity: No Food Insecurity (08/06/2021)   Hunger Vital Sign    Worried About Running Out of Food in the Last Year: Never true    [place] in the Last Year: Never true  Transportation  Needs: No Transportation Needs (08/11/2022)   PRAPARE - Hydrologist (Medical): No    Lack of Transportation (Non-Medical): No  Physical Activity: Inactive (08/11/2022)   Exercise Vital Sign    Days of Exercise per Week: 0 days    Minutes of Exercise per Session: 0 min  Stress: No Stress Concern Present (08/11/2022)   [place]    Feeling of Stress : Not at all  Social Connections: Unknown (08/11/2022)   Social Connection and Isolation Panel [NHANES]    Frequency of Communication with Friends and Family: Once  a week    Frequency of Social Gatherings with Friends and Family: Never    Attends Religious Services: Never    Marine scientist or Organizations: No    Attends Music therapist: Never    Marital Status: Not on file    Tobacco Counseling Ready to quit: Not Answered Counseling given: Not Answered   Clinical Intake:  Pre-visit preparation completed: Yes  Pain : 0-10 Pain Score: 5  Pain Location: Back Pain Descriptors / Indicators: Burning, Constant, Aching, Discomfort Pain Relieving Factors: injection yesterday  Pain Relieving Factors: injection yesterday  Diabetes: No  How often do you need to have someone help you when you read instructions, pamphlets, or other written materials from your doctor or pharmacy?: 1 - Never  Diabetic?  no  Interpreter Needed?: No  Information entered by :: Leroy Kennedy LPN   Activities of Daily Living    08/11/2022    1:21 PM  In your present state of health, do you have any difficulty performing the following activities:  Hearing? 0  Vision? 0  Difficulty concentrating or making decisions? 0  Walking or climbing stairs? 0  Dressing or bathing? 1  Preparing Food and eating ? Y  Using the Toilet? Y  In the past six months, have you accidently leaked urine? N  Do you have problems with loss of bowel control? N   Managing your Medications? N  Managing your Finances? N  Housekeeping or managing your Housekeeping? Y    Patient Care Team: Pleas Koch, NP as PCP - General (Internal Medicine) Joan Flores Richard Miu, DO as Referring Physician (Optometry) Isaias Cowman, MD as Consulting Physician (Cardiology)  Indicate any recent Medical Services you may have received from other than [facility] providers in the past year (date may be approximate).     Assessment:   This is a routine wellness examination for Morris.  Hearing/Vision screen Hearing Screening - Comments:: No trouble hearing Vision Screening - Comments:: Not up to date  Dietary issues and exercise activities discussed: Current Exercise Habits: The patient does not participate in regular exercise at present, Exercise limited by: orthopedic condition(s)   Goals Addressed             This Visit's Progress    Increase water intake   On track    Starting 08/03/2018, I will attempt to drink at least 6-8 glasses of water daily.      Patient Stated   Not on track    Would like to walk again and be more independent.      Patient Stated       No goals       Depression Screen    08/11/2022    1:31 PM 08/06/2021    1:37 PM 08/03/2018   11:56 AM 06/17/2017   11:57 AM 04/29/2016    3:07 PM 04/29/2016    2:29 PM  PHQ 2/9 Scores  PHQ - 2 Score 0 0 3 4 0 0  PHQ- 9 Score 0  3 11      Fall Risk    08/11/2022    1:11 PM 08/06/2021    1:36 PM 08/03/2018   11:56 AM 06/17/2017   11:57 AM 04/29/2016    3:07 PM  Fall Risk   Falls in the past year? 1 1 No Yes No  Comment    7-8 falls over the previous 12 mths due to back or knees "giving away"   Number falls in past yr:  $'1 1  2 'm$ or more   Comment  issues with balance     Injury with Fall? 0 0  Yes   Risk Factor Category     High Fall Risk   Risk for fall due to : Impaired balance/gait;Impaired mobility Impaired balance/gait  Impaired balance/gait;Impaired mobility   Follow up Falls  evaluation completed;Education provided;Falls prevention discussed Falls prevention discussed       FALL RISK PREVENTION PERTAINING TO THE HOME:  Any stairs in or around the home? No  If so, are there any without handrails? No  Home free of loose throw rugs in walkways, pet beds, electrical cords, etc? Yes  Adequate lighting in your home to reduce risk of falls? Yes   ASSISTIVE DEVICES UTILIZED TO PREVENT FALLS:  Life alert? No  Use of a cane, walker or w/c? Yes  Grab bars in the bathroom? No  Shower chair or bench in shower? Yes  Elevated toilet seat or a handicapped toilet? No   TIMED UP AND GO:  Was the test performed? No .    Cognitive Function:    08/03/2018    4:00 PM 06/17/2017   12:15 PM  MMSE - Mini Mental State Exam  Orientation to time 5 5  Orientation to Place 5 5  Registration 3 3  Attention/ Calculation 0 0  Recall 3 2  Recall-comments  pt was unable to recall 1 of 3 words  Language- name 2 objects 0 0  Language- repeat 1 1  Language- follow 3 step command 3 3  Language- read & follow direction 0 0  Write a sentence 0 0  Copy design 0 0  Total score 20 19        08/11/2022    1:13 PM  6CIT Screen  What Year? 0 points  What month? 0 points  What time? 3 points  Count back from 20 2 points  Months in reverse 2 points  Repeat phrase 4 points  Total Score 11 points    Immunizations Immunization History  Administered Date(s) Administered   Fluad Quad(high Dose 65+) 07/05/2019   Influenza Split 07/18/2014   Influenza,inj,Quad PF,6+ Mos 06/17/2017, 08/03/2018   Moderna Sars-Covid-2 Vaccination 04/29/2020, 05/30/2020   Pneumococcal Conjugate-13 04/29/2016   Pneumococcal Polysaccharide-23 06/17/2017   Zoster, Live 08/09/2013    TDAP status: Due, Education has been provided regarding the importance of this vaccine. Advised may receive this vaccine at local pharmacy or Health Dept. Aware to provide a copy of the vaccination record if obtained from  local pharmacy or Health Dept. Verbalized acceptance and understanding.  Flu Vaccine status: Due, Education has been provided regarding the importance of this vaccine. Advised may receive this vaccine at local pharmacy or Health Dept. Aware to provide a copy of the vaccination record if obtained from local pharmacy or Health Dept. Verbalized acceptance and understanding.  Pneumococcal vaccine status: Up to date  Covid-19 vaccine status: Information provided on how to obtain vaccines.   Qualifies for Shingles Vaccine? Yes   Zostavax completed Yes   Shingrix Completed?: No.    Education has been provided regarding the importance of this vaccine. Patient has been advised to call insurance company to determine out of pocket expense if they have not yet received this vaccine. Advised may also receive vaccine at local pharmacy or Health Dept. Verbalized acceptance and understanding.  Screening Tests Health Maintenance  Topic Date Due   TETANUS/TDAP  10/11/2020   COVID-19 Vaccine (3 - Moderna series) 08/27/2022 (Originally  07/25/2020)   Zoster Vaccines- Shingrix (1 of 2) 11/11/2022 (Originally 09/01/1997)   INFLUENZA VACCINE  01/09/2023 (Originally 05/11/2022)   Lung Cancer Screening  06/01/2023   Medicare Annual Wellness (AWV)  08/12/2023   COLONOSCOPY (Pts 45-53yr Insurance coverage will need to be confirmed)  03/22/2032   Pneumonia Vaccine 75 Years old  Completed   Hepatitis C Screening  Completed   HPV VACCINES  Aged Out    Health Maintenance  Health Maintenance Due  Topic Date Due   TETANUS/TDAP  10/11/2020    Colorectal cancer screening: No longer required.   Lung Cancer Screening: (Low Dose CT Chest recommended if Age 75-80years, 30 pack-year currently smoking OR have quit w/in 15years.) does qualify.   Lung Cancer Screening Referral: had appointment 2023 due 2024  Additional Screening:  Hepatitis C Screening: does not qualify; Completed 2016  Vision Screening:  Recommended annual ophthalmology exams for early detection of glaucoma and other disorders of the eye. Is the patient up to date with their annual eye exam?  No  Who is the provider or what is the name of the office in which the patient attends annual eye exams?  If pt is not established with a provider, would they like to be referred to a provider to establish care? No .   Dental Screening: Recommended annual dental exams for proper oral hygiene  Community Resource Referral / Chronic Care Management: CRR required this visit?  No   CCM required this visit?  No      Plan:     I have personally reviewed and noted the following in the patient's chart:   Medical and social history Use of alcohol, tobacco or illicit drugs  Current medications and supplements including opioid prescriptions. Patient is not currently taking opioid prescriptions. Functional ability and status Nutritional status Physical activity Advanced directives List of other physicians Hospitalizations, surgeries, and ER visits in previous 12 months Vitals Screenings to include cognitive, depression, and falls Referrals and appointments  In addition, I have reviewed and discussed with patient certain preventive protocols, quality metrics, and best practice recommendations. A written personalized care plan for preventive services as well as general preventive health recommendations were provided to patient.     JLeroy Kennedy LPN   182/05/33  Nurse Notes:

## 2022-08-25 ENCOUNTER — Ambulatory Visit: Payer: Medicare Other

## 2022-09-08 ENCOUNTER — Ambulatory Visit (INDEPENDENT_AMBULATORY_CARE_PROVIDER_SITE_OTHER): Payer: Medicare Other

## 2022-09-08 DIAGNOSIS — E538 Deficiency of other specified B group vitamins: Secondary | ICD-10-CM

## 2022-09-08 MED ORDER — CYANOCOBALAMIN 1000 MCG/ML IJ SOLN
1000.0000 ug | Freq: Once | INTRAMUSCULAR | Status: AC
  Administered 2022-09-08: 1000 ug via INTRAMUSCULAR

## 2022-09-08 NOTE — Progress Notes (Signed)
Per orders of Karl Ito, NP , injection of B-12  given by Barkley Bruns in right deltoid. Patient tolerated injection well.

## 2022-09-20 ENCOUNTER — Encounter
Admission: EM | Disposition: A | Discharge status: Home Health | Payer: Self-pay | Source: Home / Self Care | Attending: Internal Medicine

## 2022-09-20 ENCOUNTER — Inpatient Hospital Stay
Admission: EM | Admit: 2022-09-20 | Discharge: 2022-09-24 | DRG: 321 | Disposition: A | Discharge status: Home Health | Payer: Medicare Other | Attending: Internal Medicine | Admitting: Internal Medicine

## 2022-09-20 ENCOUNTER — Other Ambulatory Visit: Payer: Self-pay

## 2022-09-20 ENCOUNTER — Emergency Department: Payer: Medicare Other

## 2022-09-20 DIAGNOSIS — F17210 Nicotine dependence, cigarettes, uncomplicated: Secondary | ICD-10-CM | POA: Diagnosis present

## 2022-09-20 DIAGNOSIS — Z7982 Long term (current) use of aspirin: Secondary | ICD-10-CM

## 2022-09-20 DIAGNOSIS — R0602 Shortness of breath: Secondary | ICD-10-CM | POA: Diagnosis not present

## 2022-09-20 DIAGNOSIS — G4700 Insomnia, unspecified: Secondary | ICD-10-CM | POA: Diagnosis present

## 2022-09-20 DIAGNOSIS — Z800 Family history of malignant neoplasm of digestive organs: Secondary | ICD-10-CM

## 2022-09-20 DIAGNOSIS — N138 Other obstructive and reflux uropathy: Secondary | ICD-10-CM | POA: Diagnosis not present

## 2022-09-20 DIAGNOSIS — I213 ST elevation (STEMI) myocardial infarction of unspecified site: Principal | ICD-10-CM

## 2022-09-20 DIAGNOSIS — I200 Unstable angina: Secondary | ICD-10-CM | POA: Diagnosis not present

## 2022-09-20 DIAGNOSIS — Z9861 Coronary angioplasty status: Secondary | ICD-10-CM | POA: Diagnosis not present

## 2022-09-20 DIAGNOSIS — F17200 Nicotine dependence, unspecified, uncomplicated: Secondary | ICD-10-CM | POA: Diagnosis not present

## 2022-09-20 DIAGNOSIS — I21A9 Other myocardial infarction type: Secondary | ICD-10-CM | POA: Diagnosis present

## 2022-09-20 DIAGNOSIS — Z79899 Other long term (current) drug therapy: Secondary | ICD-10-CM | POA: Diagnosis not present

## 2022-09-20 DIAGNOSIS — Z8249 Family history of ischemic heart disease and other diseases of the circulatory system: Secondary | ICD-10-CM

## 2022-09-20 DIAGNOSIS — Z720 Tobacco use: Secondary | ICD-10-CM | POA: Diagnosis not present

## 2022-09-20 DIAGNOSIS — I4510 Unspecified right bundle-branch block: Secondary | ICD-10-CM | POA: Diagnosis present

## 2022-09-20 DIAGNOSIS — R0789 Other chest pain: Secondary | ICD-10-CM | POA: Diagnosis not present

## 2022-09-20 DIAGNOSIS — Y840 Cardiac catheterization as the cause of abnormal reaction of the patient, or of later complication, without mention of misadventure at the time of the procedure: Secondary | ICD-10-CM | POA: Diagnosis present

## 2022-09-20 DIAGNOSIS — M4802 Spinal stenosis, cervical region: Secondary | ICD-10-CM | POA: Diagnosis not present

## 2022-09-20 DIAGNOSIS — Z888 Allergy status to other drugs, medicaments and biological substances status: Secondary | ICD-10-CM | POA: Diagnosis not present

## 2022-09-20 DIAGNOSIS — I5021 Acute systolic (congestive) heart failure: Secondary | ICD-10-CM | POA: Diagnosis not present

## 2022-09-20 DIAGNOSIS — E785 Hyperlipidemia, unspecified: Secondary | ICD-10-CM | POA: Diagnosis not present

## 2022-09-20 DIAGNOSIS — F419 Anxiety disorder, unspecified: Secondary | ICD-10-CM | POA: Diagnosis present

## 2022-09-20 DIAGNOSIS — F32A Depression, unspecified: Secondary | ICD-10-CM | POA: Diagnosis not present

## 2022-09-20 DIAGNOSIS — I2102 ST elevation (STEMI) myocardial infarction involving left anterior descending coronary artery: Secondary | ICD-10-CM | POA: Diagnosis not present

## 2022-09-20 DIAGNOSIS — I472 Ventricular tachycardia, unspecified: Secondary | ICD-10-CM | POA: Diagnosis not present

## 2022-09-20 DIAGNOSIS — I499 Cardiac arrhythmia, unspecified: Secondary | ICD-10-CM | POA: Diagnosis not present

## 2022-09-20 DIAGNOSIS — I10 Essential (primary) hypertension: Secondary | ICD-10-CM | POA: Diagnosis present

## 2022-09-20 DIAGNOSIS — Z8042 Family history of malignant neoplasm of prostate: Secondary | ICD-10-CM | POA: Diagnosis not present

## 2022-09-20 DIAGNOSIS — I2510 Atherosclerotic heart disease of native coronary artery without angina pectoris: Secondary | ICD-10-CM | POA: Diagnosis present

## 2022-09-20 DIAGNOSIS — N401 Enlarged prostate with lower urinary tract symptoms: Secondary | ICD-10-CM | POA: Diagnosis present

## 2022-09-20 DIAGNOSIS — Z743 Need for continuous supervision: Secondary | ICD-10-CM | POA: Diagnosis not present

## 2022-09-20 DIAGNOSIS — I255 Ischemic cardiomyopathy: Secondary | ICD-10-CM | POA: Diagnosis present

## 2022-09-20 DIAGNOSIS — D696 Thrombocytopenia, unspecified: Secondary | ICD-10-CM | POA: Diagnosis not present

## 2022-09-20 DIAGNOSIS — I219 Acute myocardial infarction, unspecified: Secondary | ICD-10-CM | POA: Diagnosis not present

## 2022-09-20 DIAGNOSIS — T82855A Stenosis of coronary artery stent, initial encounter: Secondary | ICD-10-CM | POA: Diagnosis not present

## 2022-09-20 DIAGNOSIS — I201 Angina pectoris with documented spasm: Secondary | ICD-10-CM | POA: Diagnosis not present

## 2022-09-20 DIAGNOSIS — I959 Hypotension, unspecified: Secondary | ICD-10-CM | POA: Diagnosis not present

## 2022-09-20 DIAGNOSIS — R079 Chest pain, unspecified: Secondary | ICD-10-CM | POA: Diagnosis not present

## 2022-09-20 DIAGNOSIS — I9589 Other hypotension: Secondary | ICD-10-CM | POA: Diagnosis not present

## 2022-09-20 DIAGNOSIS — I872 Venous insufficiency (chronic) (peripheral): Secondary | ICD-10-CM | POA: Diagnosis not present

## 2022-09-20 HISTORY — PX: LEFT HEART CATH AND CORONARY ANGIOGRAPHY: CATH118249

## 2022-09-20 HISTORY — PX: CORONARY/GRAFT ACUTE MI REVASCULARIZATION: CATH118305

## 2022-09-20 LAB — CBC WITH DIFFERENTIAL/PLATELET
Abs Immature Granulocytes: 0.07 10*3/uL (ref 0.00–0.07)
Basophils Absolute: 0 10*3/uL (ref 0.0–0.1)
Basophils Relative: 0 %
Eosinophils Absolute: 0 10*3/uL (ref 0.0–0.5)
Eosinophils Relative: 0 %
HCT: 47.5 % (ref 39.0–52.0)
Hemoglobin: 16.2 g/dL (ref 13.0–17.0)
Immature Granulocytes: 1 %
Lymphocytes Relative: 14 %
Lymphs Abs: 1.5 10*3/uL (ref 0.7–4.0)
MCH: 32.6 pg (ref 26.0–34.0)
MCHC: 34.1 g/dL (ref 30.0–36.0)
MCV: 95.6 fL (ref 80.0–100.0)
Monocytes Absolute: 0.5 10*3/uL (ref 0.1–1.0)
Monocytes Relative: 5 %
Neutro Abs: 8.7 10*3/uL — ABNORMAL HIGH (ref 1.7–7.7)
Neutrophils Relative %: 80 %
Platelets: 209 10*3/uL (ref 150–400)
RBC: 4.97 MIL/uL (ref 4.22–5.81)
RDW: 12 % (ref 11.5–15.5)
WBC: 10.8 10*3/uL — ABNORMAL HIGH (ref 4.0–10.5)
nRBC: 0 % (ref 0.0–0.2)

## 2022-09-20 LAB — PROTIME-INR
INR: 1.2 (ref 0.8–1.2)
Prothrombin Time: 14.8 seconds (ref 11.4–15.2)

## 2022-09-20 LAB — LIPID PANEL
Cholesterol: 232 mg/dL — ABNORMAL HIGH (ref 0–200)
HDL: 32 mg/dL — ABNORMAL LOW (ref >40)
LDL Cholesterol: 157 mg/dL — ABNORMAL HIGH (ref 0–99)
Total CHOL/HDL Ratio: 7.3 RATIO
Triglycerides: 217 mg/dL — ABNORMAL HIGH (ref <150)
VLDL: 43 mg/dL — ABNORMAL HIGH (ref 0–40)

## 2022-09-20 LAB — COMPREHENSIVE METABOLIC PANEL
ALT: 15 U/L (ref 0–44)
AST: 23 U/L (ref 15–41)
Albumin: 4.4 g/dL (ref 3.5–5.0)
Alkaline Phosphatase: 61 U/L (ref 38–126)
Anion gap: 9 (ref 5–15)
BUN: 10 mg/dL (ref 8–23)
CO2: 24 mmol/L (ref 22–32)
Calcium: 10 mg/dL (ref 8.9–10.3)
Chloride: 105 mmol/L (ref 98–111)
Creatinine, Ser: 1.2 mg/dL (ref 0.61–1.24)
GFR, Estimated: >60 mL/min (ref >60)
Glucose, Bld: 131 mg/dL — ABNORMAL HIGH (ref 70–99)
Potassium: 4.5 mmol/L (ref 3.5–5.1)
Sodium: 138 mmol/L (ref 135–145)
Total Bilirubin: 1.1 mg/dL (ref 0.3–1.2)
Total Protein: 8.2 g/dL — ABNORMAL HIGH (ref 6.5–8.1)

## 2022-09-20 LAB — MRSA NEXT GEN BY PCR, NASAL: MRSA by PCR Next Gen: NOT DETECTED

## 2022-09-20 LAB — APTT: aPTT: 95 seconds — ABNORMAL HIGH (ref 24–36)

## 2022-09-20 LAB — TROPONIN I (HIGH SENSITIVITY)
Troponin I (High Sensitivity): 254 ng/L (ref <18)
Troponin I (High Sensitivity): >24000 ng/L (ref <18)

## 2022-09-20 SURGERY — CORONARY/GRAFT ACUTE MI REVASCULARIZATION
Anesthesia: Moderate Sedation

## 2022-09-20 MED ORDER — TIROFIBAN HCL IV 12.5 MG/250 ML
INTRAVENOUS | Status: AC | PRN
  Administered 2022-09-20: .075 ug/kg/min via INTRAVENOUS

## 2022-09-20 MED ORDER — ROSUVASTATIN CALCIUM 10 MG PO TABS
10.0000 mg | ORAL_TABLET | Freq: Every day | ORAL | Status: DC
  Administered 2022-09-20 – 2022-09-21 (×2): 10 mg via ORAL
  Filled 2022-09-20 (×3): qty 1

## 2022-09-20 MED ORDER — ONDANSETRON HCL 4 MG/2ML IJ SOLN: 4.0000 mg | Freq: Four times a day (QID) | INTRAMUSCULAR | Status: DC | PRN

## 2022-09-20 MED ORDER — IOHEXOL 300 MG/ML  SOLN
INTRAMUSCULAR | Status: DC | PRN
  Administered 2022-09-20: 205 mL

## 2022-09-20 MED ORDER — NITROGLYCERIN 0.4 MG SL SUBL
0.4000 mg | SUBLINGUAL_TABLET | SUBLINGUAL | Status: DC | PRN
  Administered 2022-09-20: 0.4 mg via SUBLINGUAL

## 2022-09-20 MED ORDER — LIDOCAINE HCL 1 % IJ SOLN
INTRAMUSCULAR | Status: AC
  Filled 2022-09-20: qty 20

## 2022-09-20 MED ORDER — SODIUM CHLORIDE 0.9% FLUSH
3.0000 mL | INTRAVENOUS | Status: DC | PRN
  Administered 2022-09-20: 3 mL via INTRAVENOUS

## 2022-09-20 MED ORDER — NICOTINE 21 MG/24HR TD PT24
21.0000 mg | MEDICATED_PATCH | Freq: Every day | TRANSDERMAL | Status: DC | PRN
  Administered 2022-09-20 – 2022-09-23 (×3): 21 mg via TRANSDERMAL
  Filled 2022-09-20 (×4): qty 1

## 2022-09-20 MED ORDER — FLUTICASONE PROPIONATE 50 MCG/ACT NA SUSP
2.0000 | Freq: Every day | NASAL | Status: DC
  Administered 2022-09-21 – 2022-09-24 (×3): 2 via NASAL
  Filled 2022-09-20: qty 16

## 2022-09-20 MED ORDER — VERAPAMIL HCL 2.5 MG/ML IV SOLN
INTRAVENOUS | Status: AC
  Filled 2022-09-20: qty 2

## 2022-09-20 MED ORDER — MORPHINE SULFATE (PF) 2 MG/ML IV SOLN: 2.0000 mg | INTRAVENOUS | Status: DC | PRN

## 2022-09-20 MED ORDER — SENNOSIDES-DOCUSATE SODIUM 8.6-50 MG PO TABS: 1.0000 | ORAL_TABLET | Freq: Every evening | ORAL | Status: DC | PRN

## 2022-09-20 MED ORDER — TIROFIBAN HCL IV 12.5 MG/250 ML: 0.0750 ug/kg/min | INTRAVENOUS | Status: DC

## 2022-09-20 MED ORDER — ACETAMINOPHEN 650 MG RE SUPP: 650.0000 mg | Freq: Four times a day (QID) | RECTAL | Status: DC | PRN

## 2022-09-20 MED ORDER — MORPHINE SULFATE (PF) 2 MG/ML IV SOLN
2.0000 mg | Freq: Once | INTRAVENOUS | Status: AC
  Administered 2022-09-20: 2 mg via INTRAVENOUS

## 2022-09-20 MED ORDER — ASPIRIN 81 MG PO CHEW
81.0000 mg | CHEWABLE_TABLET | Freq: Every day | ORAL | Status: DC
  Administered 2022-09-21 – 2022-09-24 (×4): 81 mg via ORAL
  Filled 2022-09-20 (×3): qty 1

## 2022-09-20 MED ORDER — NITROGLYCERIN 2 % TD OINT
0.5000 [in_us] | TOPICAL_OINTMENT | Freq: Once | TRANSDERMAL | Status: AC
  Administered 2022-09-20: 0.5 [in_us] via TOPICAL

## 2022-09-20 MED ORDER — FENTANYL CITRATE (PF) 100 MCG/2ML IJ SOLN
INTRAMUSCULAR | Status: DC | PRN
  Administered 2022-09-20: 25 ug via INTRAVENOUS

## 2022-09-20 MED ORDER — ACETAMINOPHEN 325 MG PO TABS
650.0000 mg | ORAL_TABLET | Freq: Four times a day (QID) | ORAL | Status: DC | PRN
  Administered 2022-09-20 – 2022-09-24 (×3): 650 mg via ORAL
  Filled 2022-09-20 (×3): qty 2

## 2022-09-20 MED ORDER — MELATONIN 5 MG PO TABS
5.0000 mg | ORAL_TABLET | Freq: Every evening | ORAL | Status: DC | PRN
  Administered 2022-09-21 – 2022-09-22 (×2): 5 mg via ORAL
  Filled 2022-09-20 (×2): qty 1

## 2022-09-20 MED ORDER — CHLORHEXIDINE GLUCONATE CLOTH 2 % EX PADS
6.0000 | MEDICATED_PAD | Freq: Every day | CUTANEOUS | Status: DC
  Administered 2022-09-20 – 2022-09-24 (×4): 6 via TOPICAL

## 2022-09-20 MED ORDER — NITROGLYCERIN 2 % TD OINT
1.0000 [in_us] | TOPICAL_OINTMENT | Freq: Four times a day (QID) | TRANSDERMAL | Status: DC
  Administered 2022-09-21 – 2022-09-24 (×9): 1 [in_us] via TOPICAL
  Filled 2022-09-20 (×9): qty 1

## 2022-09-20 MED ORDER — ONDANSETRON HCL 4 MG PO TABS: 4.0000 mg | ORAL_TABLET | Freq: Four times a day (QID) | ORAL | Status: DC | PRN

## 2022-09-20 MED ORDER — SODIUM CHLORIDE 0.9% FLUSH
3.0000 mL | Freq: Two times a day (BID) | INTRAVENOUS | Status: DC
  Administered 2022-09-20 – 2022-09-23 (×6): 3 mL via INTRAVENOUS

## 2022-09-20 MED ORDER — TICAGRELOR 90 MG PO TABS
180.0000 mg | ORAL_TABLET | Freq: Once | ORAL | Status: AC
  Administered 2022-09-20: 180 mg via ORAL

## 2022-09-20 MED ORDER — SODIUM CHLORIDE 0.9 % IV SOLN: 250.0000 mL | INTRAVENOUS | Status: DC | PRN

## 2022-09-20 MED ORDER — ACETAMINOPHEN 325 MG PO TABS: 650.0000 mg | ORAL_TABLET | ORAL | Status: DC | PRN

## 2022-09-20 MED ORDER — CARVEDILOL 3.125 MG PO TABS: 3.1250 mg | ORAL_TABLET | Freq: Two times a day (BID) | ORAL | Status: DC

## 2022-09-20 MED ORDER — MIDAZOLAM HCL 2 MG/2ML IJ SOLN
INTRAMUSCULAR | Status: AC
  Filled 2022-09-20: qty 2

## 2022-09-20 MED ORDER — ENOXAPARIN SODIUM 40 MG/0.4ML IJ SOSY: 40.0000 mg | PREFILLED_SYRINGE | INTRAMUSCULAR | Status: DC

## 2022-09-20 MED ORDER — HEPARIN SODIUM (PORCINE) 1000 UNIT/ML IJ SOLN
INTRAMUSCULAR | Status: DC | PRN
  Administered 2022-09-20: 4000 [IU] via INTRAVENOUS

## 2022-09-20 MED ORDER — SODIUM CHLORIDE 0.9 % IV SOLN: INTRAVENOUS | Status: DC

## 2022-09-20 MED ORDER — TIROFIBAN (AGGRASTAT) BOLUS VIA INFUSION
INTRAVENOUS | Status: DC | PRN
  Administered 2022-09-20: 2155 ug via INTRAVENOUS

## 2022-09-20 MED ORDER — VERAPAMIL HCL 2.5 MG/ML IV SOLN
INTRAVENOUS | Status: DC | PRN
  Administered 2022-09-20: 2.5 mg via INTRAVENOUS

## 2022-09-20 MED ORDER — FENTANYL CITRATE (PF) 100 MCG/2ML IJ SOLN
INTRAMUSCULAR | Status: AC
  Filled 2022-09-20: qty 2

## 2022-09-20 MED ORDER — DIAZEPAM 2 MG PO TABS
2.0000 mg | ORAL_TABLET | Freq: Two times a day (BID) | ORAL | Status: DC | PRN
  Administered 2022-09-21: 2 mg via ORAL
  Filled 2022-09-20: qty 1

## 2022-09-20 MED ORDER — MIDAZOLAM HCL 2 MG/2ML IJ SOLN
INTRAMUSCULAR | Status: DC | PRN
  Administered 2022-09-20 (×2): 1 mg via INTRAVENOUS

## 2022-09-20 MED ORDER — CARVEDILOL 3.125 MG PO TABS
3.1250 mg | ORAL_TABLET | Freq: Two times a day (BID) | ORAL | Status: DC
  Administered 2022-09-20 – 2022-09-24 (×7): 3.125 mg via ORAL
  Filled 2022-09-20 (×7): qty 1

## 2022-09-20 MED ORDER — ASPIRIN 81 MG PO CHEW: 324.0000 mg | CHEWABLE_TABLET | Freq: Once | ORAL | Status: DC

## 2022-09-20 MED ORDER — HEPARIN SODIUM (PORCINE) 5000 UNIT/ML IJ SOLN
5000.0000 [IU] | Freq: Three times a day (TID) | INTRAMUSCULAR | Status: AC
  Administered 2022-09-21 (×3): 5000 [IU] via SUBCUTANEOUS
  Filled 2022-09-20 (×3): qty 1

## 2022-09-20 MED ORDER — TIROFIBAN HCL IV 12.5 MG/250 ML
INTRAVENOUS | Status: AC
  Filled 2022-09-20: qty 250

## 2022-09-20 MED ORDER — HEPARIN SODIUM (PORCINE) 1000 UNIT/ML IJ SOLN
INTRAMUSCULAR | Status: AC
  Filled 2022-09-20: qty 10

## 2022-09-20 MED ORDER — NITROGLYCERIN 1 MG/10 ML FOR IR/CATH LAB
INTRA_ARTERIAL | Status: AC
  Filled 2022-09-20: qty 10

## 2022-09-20 MED ORDER — HEPARIN (PORCINE) IN NACL 1000-0.9 UT/500ML-% IV SOLN
INTRAVENOUS | Status: AC
  Filled 2022-09-20: qty 1000

## 2022-09-20 MED ORDER — MORPHINE SULFATE (PF) 2 MG/ML IV SOLN
INTRAVENOUS | Status: AC
  Filled 2022-09-20: qty 1

## 2022-09-20 MED ORDER — HEPARIN SODIUM (PORCINE) 5000 UNIT/ML IJ SOLN
5000.0000 [IU] | Freq: Once | INTRAMUSCULAR | Status: AC
  Administered 2022-09-20: 5000 [IU] via INTRAVENOUS

## 2022-09-20 MED ORDER — TICAGRELOR 90 MG PO TABS: 90.0000 mg | ORAL_TABLET | Freq: Two times a day (BID) | ORAL | Status: DC

## 2022-09-20 MED ORDER — NITROGLYCERIN 0.4 MG SL SUBL
0.4000 mg | SUBLINGUAL_TABLET | SUBLINGUAL | Status: DC | PRN
  Administered 2022-09-20 (×2): 0.4 mg via SUBLINGUAL
  Filled 2022-09-20 (×2): qty 1

## 2022-09-20 SURGICAL SUPPLY — 22 items
BALLN TREK RX 2.5X15 (BALLOONS) ×1
BALLN TREK RX 3.0X20 (BALLOONS) ×1
BALLN ~~LOC~~ TREK NEO RX 3.5X20 (BALLOONS) IMPLANT
BALLOON TREK RX 2.5X15 (BALLOONS) IMPLANT
BALLOON TREK RX 3.0X20 (BALLOONS) IMPLANT
CATH INFINITI JR4 5F (CATHETERS) IMPLANT
CATH LAUNCHER 6FR EBU3.5 (CATHETERS) IMPLANT
DEVICE RAD TR BAND REGULAR (VASCULAR PRODUCTS) IMPLANT
DRAPE BRACHIAL (DRAPES) IMPLANT
GLIDESHEATH SLEND SS 6F .021 (SHEATH) IMPLANT
GUIDEWIRE INQWIRE 1.5J.035X260 (WIRE) IMPLANT
INQWIRE 1.5J .035X260CM (WIRE) ×1
KIT ENCORE 26 ADVANTAGE (KITS) IMPLANT
PACK CARDIAC CATH (CUSTOM PROCEDURE TRAY) ×1 IMPLANT
PROTECTION STATION PRESSURIZED (MISCELLANEOUS) ×1
SET ATX SIMPLICITY (MISCELLANEOUS) IMPLANT
STATION PROTECTION PRESSURIZED (MISCELLANEOUS) IMPLANT
STENT ONYX FRONTIER 3.5X18 (Permanent Stent) IMPLANT
STENT ONYX FRONTIER 3.5X26 (Permanent Stent) IMPLANT
TUBING CIL FLEX 10 FLL-RA (TUBING) IMPLANT
WIRE RUNTHROUGH .014X180CM (WIRE) IMPLANT
WIRE RUNTHROUGH IZANAI 014 180 (WIRE) IMPLANT

## 2022-09-20 NOTE — Consult Note (Signed)
Cardiology Consultation   Patient ID: Matthew Norton MRN: [id number]; DOB: [birth year]  Admit date: 09/20/2022 Date of Consult: 09/20/2022  PCP:  Tonia Ghent, MD   [facility] Cardiologist:  Matthew Norton   Patient Profile:   Matthew Norton is a 76 y.o. male with a hx of coronary artery disease status post MI and stent placement x 3 who is being seen 09/20/2022 for the evaluation of anterior STEMI at the request of Dr. Rip Harbour.  History of Present Illness:   Matthew Norton is a 76 year old male with known history of coronary artery disease status post remote MI with stent placement to the LAD and 2 stent placement to the right coronary artery.  This was more than 10 years ago.  He has known history of active tobacco use, essential hypertension and hyperlipidemia with reported intolerance to atorvastatin and pravastatin. He has not seen Matthew Norton since October 2021.  He reports having intermittent exertional chest pain over the last 6 months that resolved with rest.  He started using nitroglycerin recently and the chest pain was always responsive to nitroglycerin.  However, he went to his mailbox this evening and started having severe substernal chest pain that did not resolve with 3 sublingual nitroglycerin and thus he called 911.  An EKG was performed and showed evidence of anterior ST elevation.  A code STEMI was activated.  His EKG improved in the ED but he was still having significant pain.  The patient was given 5000 units of unfractionated heparin and 180 mg of Brilinta orally.  I recommended proceeding with emergent cardiac catheterization.  Past Medical History:  Diagnosis Date   Anxiety    takes Valium daily as needed   Arthritis    Cataracts, bilateral    Complication of anesthesia    slow to wake up after one surgery   Coronary artery disease    Depression    Hyperlipemia    has tried meds but not on any bc of joint aches and pains   Hypertension     takes Metoprolol and Losartan daily   Insomnia    Joint pain    Peripheral edema    Shortness of breath    with exertion    Weakness    numbness in both hands and feet.    Past Surgical History:  Procedure Laterality Date   ANTERIOR CERVICAL DECOMP/DISCECTOMY FUSION N/A 03/24/2015   Procedure: Cervical four- five, anterior cervical decompression with fusion, plating and bonegraft;  Surgeon: Jovita Gamma, MD;  Location: [place] [facility];  Service: Neurosurgery;  Laterality: N/A;  C45 anterior cervical decompression with fusion plating and bonegraft   BACK SURGERY  2001   lumb lam   CARDIAC CATHETERIZATION  08,11   stents x2   [place]   fx rt    COLONOSCOPY WITH PROPOFOL N/A 03/31/2017   Procedure: COLONOSCOPY WITH PROPOFOL;  Surgeon: Jonathon Bellows, MD;  Location: [facility] ENDOSCOPY;  Service: Endoscopy;  Laterality: N/A;   COLONOSCOPY WITH PROPOFOL N/A 08/25/2017   Procedure: COLONOSCOPY WITH PROPOFOL;  Surgeon: Jonathon Bellows, MD;  Location: [facility] ENDOSCOPY;  Service: Endoscopy;  Laterality: N/A;   COLONOSCOPY WITH PROPOFOL N/A 03/22/2022   Procedure: COLONOSCOPY WITH PROPOFOL;  Surgeon: Jonathon Bellows, MD;  Location: [facility] ENDOSCOPY;  Service: Gastroenterology;  Laterality: N/A;   CORONARY ANGIOPLASTY     2 stents   ESOPHAGOGASTRODUODENOSCOPY     HERNIA REPAIR  2007   rt and lt ing  NAILBED REPAIR Left 09/13/2013   Procedure: LEFT THUMB NAIL REMOVAL, NAIL BED RECONSTRUCTION PARTIAL EXCISION DISTAL PHALANX WITH COMPLEX RECONSTRUCTION ;  Surgeon: Roseanne Kaufman, MD;  Location: [place];  Service: Orthopedics;  Laterality: Left;   parotid mass removed     REPLANTATION THUMB  2003   left     Home Medications:  Prior to Admission medications   Medication Sig Start Date End Date Taking? Authorizing Provider  acetaminophen (TYLENOL) 500 MG tablet Take 2 tablets (1,000 mg total) by mouth every 8 (eight) hours as needed (for pain). 03/01/16   Tonia Ghent, MD   aspirin 81 MG tablet Take 81 mg by mouth daily.    [provider]  cyanocobalamin (,VITAMIN B-12,) 1000 MCG/ML injection 1000 mcg IM every 14 days 08/16/20   Tonia Ghent, MD  diazepam (VALIUM) 5 MG tablet TAKE 1/2 TO 1 (ONE-HALF TO ONE) TABLET BY MOUTH ONCE DAILY IN THE MORNING AND 1 TO 2 TABLETS BY MOUTH NIGHTLY-MAX OF 3 TABLETS PER DAY 07/27/22   Tonia Ghent, MD  fluticasone (FLONASE) 50 MCG/ACT nasal spray Place 2 sprays into both nostrils daily. For allergies 04/29/20   Tonia Ghent, MD  loratadine (CLARITIN) 10 MG tablet Take 10 mg by mouth daily.    [provider]  losartan (COZAAR) 25 MG tablet Take 25 mg by mouth daily.    [provider]  nitroGLYCERIN (NITROSTAT) 0.4 MG SL tablet Place 1 tablet (0.4 mg total) under the tongue every 5 (five) minutes as needed for chest pain. Max 3 doses in 15 minutes. 02/19/22   Tonia Ghent, MD  OVER THE COUNTER MEDICATION Beta Prostate, take one by mouth daily    [provider]    Inpatient Medications: Scheduled Meds:  [START ON 09/21/2022] aspirin  81 mg Oral Daily   [START ON 09/21/2022] carvedilol  3.125 mg Oral BID WC   Chlorhexidine Gluconate Cloth  6 each Topical Daily   [START ON 09/21/2022] enoxaparin (LOVENOX) injection  40 mg Subcutaneous Q24H   [START ON 09/21/2022] fluticasone  2 spray Each Nare Daily   rosuvastatin  10 mg Oral Daily   sodium chloride flush  3 mL Intravenous Q12H   [START ON 09/21/2022] ticagrelor  90 mg Oral BID   Continuous Infusions:  sodium chloride 20 mL/hr at 09/20/22 1840   sodium chloride     tirofiban     PRN Meds: sodium chloride, acetaminophen, nicotine, nitroGLYCERIN, ondansetron (ZOFRAN) IV, sodium chloride flush  Allergies:    Allergies  Allergen Reactions   Methocarbamol Shortness Of Breath and Palpitations   Bupropion Other (See Comments)   Hydrochlorothiazide Other (See Comments)    Unspecified   Paroxetine Hcl Other (See Comments)     Unspecified   Tramadol Hcl Other (See Comments)    Nightmares    Venlafaxine Other (See Comments)    Unspecified   Flexeril [Cyclobenzaprine] Other (See Comments)    Likely sedation reaction   Gabapentin Other (See Comments)    Headache,weakness   Hydroxyzine Other (See Comments)    Lack of effect for anxiety.    Lipitor [Atorvastatin] Other (See Comments)    aches   Pravastatin Other (See Comments)    nightmares    Social History:   Social History   Socioeconomic History   Marital status: Single    Spouse name: Not on file   Number of children: Not on file   Years of education: Not on file  Highest education level: Not on file  Occupational History   Not on file  Tobacco Use   Smoking status: Every Day    Packs/day: 1.00    Years: 50.00    Total pack years: 50.00    Types: Cigarettes   Smokeless tobacco: Never  Vaping Use   Vaping Use: Never used  Substance and Sexual Activity   Alcohol use: No    Alcohol/week: 0.0 standard drinks of alcohol   Drug use: No   Sexual activity: Never  Other Topics Concern   Not on file  Social History Narrative   2 kids   In relationship with long term girlfriend   Social Determinants of Health   Financial Resource Strain: High Risk (08/11/2022)   Overall Financial Resource Strain (CARDIA)    Difficulty of Paying Living Expenses: Very hard  Food Insecurity: No Food Insecurity (08/06/2021)   Hunger Vital Sign    Worried About Running Out of Food in the Last Year: Never true    Ran Out of Food in the Last Year: Never true  Transportation Needs: No Transportation Needs (08/11/2022)   PRAPARE - Hydrologist (Medical): No    Lack of Transportation (Non-Medical): No  Physical Activity: Inactive (08/11/2022)   Exercise Vital Sign    Days of Exercise per Week: 0 days    Minutes of Exercise per Session: 0 min  Stress: No Stress Concern Present (08/11/2022)   [place]    Feeling of Stress : Not at all  Social Connections: Unknown (08/11/2022)   Social Connection and Isolation Panel [NHANES]    Frequency of Communication with Friends and Family: Once a week    Frequency of Social Gatherings with Friends and Family: Never    Attends Religious Services: Never    Marine scientist or Organizations: No    Attends Archivist Meetings: Never    Marital Status: Not on file  Intimate Partner Violence: Not At Risk (08/11/2022)   Humiliation, Afraid, Rape, and Kick questionnaire    Fear of Current or Ex-Partner: No    Emotionally Abused: No    Physically Abused: No    Sexually Abused: No    Family History:    Family History  Problem Relation Age of Onset   Hypertension Mother    Cancer Mother    Cancer Father    Colon cancer Father    Prostate cancer Father      ROS:  Please see the history of present illness.   All other ROS reviewed and negative.     Physical Exam/Data:   Vitals:   09/20/22 2005 09/20/22 2010 09/20/22 2030 09/20/22 2035  BP: (!) 133/92 (!) 133/92  122/76  Pulse: 78 (!) 0 (!) 0 75  Resp: (!) 21   (!) 21  Temp:    97.6 F (36.4 C)  TempSrc:    Oral  SpO2: 98% 99%  97%  Weight:    85.4 kg  Height:    '5\' 8"'$  (1.727 m)   No intake or output data in the 24 hours ending 09/20/22 2106    09/20/2022    8:35 PM 09/20/2022    6:33 PM 07/16/2022   10:01 AM  Last 3 Weights  Weight (lbs) 188 lb 4.4 oz 190 lb 187 lb 2 oz  Weight (kg) 85.4 kg 86.183 kg 84.879 kg     Body mass index is 28.63 kg/m.  General:  Well nourished, well developed, in moderate distress due to pain HEENT: normal Neck: no JVD Vascular: No carotid bruits; Distal pulses 2+ bilaterally Cardiac:  normal S1, S2; RRR; no murmur  Lungs:  clear to auscultation bilaterally, no wheezing, rhonchi or rales  Abd: soft, nontender, no hepatomegaly  Ext: no edema Musculoskeletal:  No deformities, BUE and BLE strength  normal and equal Skin: warm and dry  Neuro:  CNs 2-12 intact, no focal abnormalities noted Psych:  Normal affect   EKG:  The EKG was personally reviewed and demonstrates: Sinus rhythm with right bundle branch block and anterior ST elevation starting in V2 to V6. Telemetry:  Telemetry was personally reviewed and demonstrates:    Relevant CV Studies:   Laboratory Data:  High Sensitivity Troponin:   Recent Labs  Lab 09/20/22 1836  TROPONINIHS 254*     Chemistry Recent Labs  Lab 09/20/22 1836  NA 138  K 4.5  CL 105  CO2 24  GLUCOSE 131*  BUN 10  CREATININE 1.20  CALCIUM 10.0  GFRNONAA >60  ANIONGAP 9    Recent Labs  Lab 09/20/22 1836  PROT 8.2*  ALBUMIN 4.4  AST 23  ALT 15  ALKPHOS 61  BILITOT 1.1   Lipids  Recent Labs  Lab 09/20/22 1836  CHOL 232*  TRIG 217*  HDL 32*  LDLCALC 157*  CHOLHDL 7.3    Hematology Recent Labs  Lab 09/20/22 1836  WBC 10.8*  RBC 4.97  HGB 16.2  HCT 47.5  MCV 95.6  MCH 32.6  MCHC 34.1  RDW 12.0  PLT 209   Thyroid No results for input(s): "TSH", "FREET4" in the last 168 hours.  BNPNo results for input(s): "BNP", "PROBNP" in the last 168 hours.  DDimer No results for input(s): "DDIMER" in the last 168 hours.   Radiology/Studies:  CARDIAC CATHETERIZATION  Result Date: 09/20/2022   Mid Cx lesion is 50% stenosed.   Ost LAD to Mid LAD lesion is 100% stenosed.   Mid LM to Dist LM lesion is 50% stenosed.   Dist LAD lesion is 70% stenosed.   Prox RCA lesion is 99% stenosed.   Prox RCA to Mid RCA lesion is 40% stenosed.   Dist RCA lesion is 40% stenosed.   A drug-eluting stent was successfully placed using a STENT ONYX FRONTIER 3.5X26.   Post intervention, there is a 0% residual stenosis.   There is moderate left ventricular systolic dysfunction.   The left ventricular ejection fraction is 25-35% by visual estimate. 1.  Anterior ST elevation myocardial infarction due to an occluded proximal LAD stent (stent thrombosis in addition  to significant in-stent restenosis).  There is also significant stenosis extending distal to the stent.  There is moderate distal left main stenosis and moderate left circumflex disease.  The proximal RCA severely diseased but appears to be chronic with some right to right and left-to-right collaterals. 2.  Moderately reduced LV systolic function with severe mid to distal anterior and apical hypokinesis. 3.  Moderately elevated left ventricular end-diastolic pressure at 28 mmHg. 4.  Successful angioplasty and 2 overlapped drug-eluting stent placement to the proximal LAD extending beyond the previously placed stent and all the way back to the ostium. Recommendations: Dual antiplatelet therapy for at least 12 months and preferably intermittently. Aggrastat infusion for 12 hours. Staged RCA PCI in 2 days before hospital discharge. History of intolerance to atorvastatin and pravastatin.  Will try small dose rosuvastatin. [facility] cardiology to follow   DG  Chest Port 1 View  Result Date: 09/20/2022 CLINICAL DATA:  Chest pain beginning at 1400 hours after walking to the mailbox, history coronary artery disease post MI x2 and coronary stenting x3 EXAM: PORTABLE CHEST 1 VIEW COMPARISON:  Portable exam 1845 hours compared to 07/05/2019 FINDINGS: External pacing leads project over chest. Normal heart size, mediastinal contours, and pulmonary vascularity. Chronic accentuation of basilar markings stable. Lungs otherwise clear. No pulmonary infiltrate, pleural effusion, or pneumothorax. Prior cervical spine fusion. IMPRESSION: No acute abnormalities. Electronically Signed   By: Lavonia Dana M.D.   On: 09/20/2022 19:12     Assessment and Plan:   Anterior ST elevation myocardial infarction: The patient was given aspirin and heparin.  I recommended proceeding with emergent cardiac catheterization which was done via the right radial artery.  It showed occluded proximal LAD stent into the ostium with significant disease distal to  the stent.  I performed successful PCI and 2 overlapped drug-eluting stent placement to the LAD.  The procedure was difficult due to extension of disease into the ostial LAD with moderate distal left main disease.  In addition, the patient was found to have subtotal occlusion of the proximal right coronary artery with some collaterals.  Recommend staged RCA PCI before hospital discharge likely on Wednesday if he remains stable.  Continue dual antiplatelet therapy for at least 12 months and preferably indefinitely. Acute systolic heart failure due to ischemic cardiomyopathy: EF of 30 to 35%.  I requested an echocardiogram.  I started small dose carvedilol.  LVEDP was 28.  If if he develops dyspnea, will give 1 dose of IV furosemide.  If renal function remains stable, recommend adding an ACE inhibitor/ARB and spironolactone. Hyperlipidemia: Reported intolerance to statins.  Will try small dose rosuvastatin. 4.  Tobacco use: Discussed with the patient the importance of smoking cessation.  [facility] cardiology to round on the patient starting tomorrow.  I communicated with their team.   Risk Assessment/Risk Scores:     TIMI Risk Score for ST  Elevation MI:   The patient's TIMI risk score is 5, which indicates a 12.4% risk of all cause mortality at 30 days.{   For questions or updates, please contact [place] Please consult [email or web address] for contact info under    Signed, Kathlyn Sacramento, MD  09/20/2022 9:06 PM

## 2022-09-20 NOTE — Progress Notes (Signed)
Chaplain provided Matthew Norton supportive presence upon arrival. Matthew Norton asked that his friend of 60 years, [name], be updated. Chaplain updated [name] who plans to visit tomorrow.     09/20/22 1900  Clinical Encounter Type  Visited With Patient  Visit Type ED

## 2022-09-20 NOTE — ED Triage Notes (Signed)
Pt arrives from home via EMS w/ c/o CP that started today around 1400 after walking to the mailbox. Hx x2 MIs with x3 stents. Pt has taken x4 nitro prior to EMS arrival without relief, was given x1 nitro by EMS, given 324 asa by EMS. Pt was SOB and diaphoretic on EMS arrival. Pt is AO4/GCS15.

## 2022-09-20 NOTE — Assessment & Plan Note (Signed)
-   Patient would benefit from tobacco cessation however he is not ready to quit

## 2022-09-20 NOTE — Assessment & Plan Note (Addendum)
-   Presumed secondary to angina secondary to CAD - Complete echo ordered per cardiology

## 2022-09-20 NOTE — Assessment & Plan Note (Addendum)
-   Status post successful DES to proximal LAD - Patient also required 2 additional stents at prior areas of stent placement - Per interventional cardiologist, patient also was noted to have 99% stenosis at RCA however this appears chronic and does have collateral.  He states that patient will need another catheterization (likely) Wednesday due to patient receiving contrast today and complicated catheterization - Per cardiologist patient does not require heparin GGT - Aspirin 81 mg daily resumed and Brilinta - Patient will require Aggrastat gtt. for 12 hours, this has been ordered by cardiology

## 2022-09-20 NOTE — Progress Notes (Signed)
Communicated with Dr. Fletcher Anon cardiology regarding changed in EKG in presence of 2/10 chest tightness. Dr. Fletcher Anon stated this was consistent with post procedure reports.

## 2022-09-20 NOTE — Progress Notes (Signed)
ANTICOAGULATION CONSULT NOTE  Pharmacy Consult for Aggrastat Indication: Post PCI, continue tirofiban (AGGRASTAT) for 12 hours  Allergies  Allergen Reactions   Methocarbamol Shortness Of Breath and Palpitations   Bupropion Other (See Comments)   Hydrochlorothiazide Other (See Comments)    Unspecified   Paroxetine Hcl Other (See Comments)    Unspecified   Tramadol Hcl Other (See Comments)    Nightmares    Venlafaxine Other (See Comments)    Unspecified   Flexeril [Cyclobenzaprine] Other (See Comments)    Likely sedation reaction   Gabapentin Other (See Comments)    Headache,weakness   Hydroxyzine Other (See Comments)    Lack of effect for anxiety.    Lipitor [Atorvastatin] Other (See Comments)    aches   Pravastatin Other (See Comments)    nightmares    Patient Measurements: Height: '5\' 8"'$  (172.7 cm) Weight: 85.4 kg (188 lb 4.4 oz) IBW/kg (Calculated) : 68.4  Vital Signs: Temp: 97.6 F (36.4 C) (12/11 2035) Temp Source: Oral (12/11 2035) BP: 122/76 (12/11 2035) Pulse Rate: 75 (12/11 2035)  Labs: Recent Labs    09/20/22 1836  HGB 16.2  HCT 47.5  PLT 209  CREATININE 1.20  TROPONINIHS 254*    Estimated Creatinine Clearance: 56.6 mL/min (by C-G formula based on SCr of 1.2 mg/dL).   Medical History: Past Medical History:  Diagnosis Date   Anxiety    takes Valium daily as needed   Arthritis    Cataracts, bilateral    Complication of anesthesia    slow to wake up after one surgery   Coronary artery disease    Depression    Hyperlipemia    has tried meds but not on any bc of joint aches and pains   Hypertension    takes Metoprolol and Losartan daily   Insomnia    Joint pain    Peripheral edema    Shortness of breath    with exertion    Weakness    numbness in both hands and feet.    Medications:  Scheduled:   [START ON 09/21/2022] aspirin  81 mg Oral Daily   [START ON 09/21/2022] carvedilol  3.125 mg Oral BID WC   Chlorhexidine Gluconate Cloth   6 each Topical Daily   [START ON 09/21/2022] enoxaparin (LOVENOX) injection  40 mg Subcutaneous Q24H   [START ON 09/21/2022] fluticasone  2 spray Each Nare Daily   rosuvastatin  10 mg Oral Daily   sodium chloride flush  3 mL Intravenous Q12H   [START ON 09/21/2022] ticagrelor  90 mg Oral BID    Assessment: 76 y.o. male who comes in with EKG transmitted by EMS showing ST elevation in all the precordial leads consistent with STEMI now s/p PCI  Goal of Therapy:  Monitor platelets by anticoagulation protocol: Yes   Plan: start tirofiban 0.075 mcg/kg/min (based on body weight and renal function) and infuse for 12 hours   Dallie Piles 09/20/2022,8:55 PM

## 2022-09-20 NOTE — Hospital Course (Addendum)
[name] is a 76 year old male with history of CAD status post 2 heart catheterization, hypertension, current tobacco use, hyperlipidemia, who presents emergency department for chief concerns of chest pain not relieved with home nitroglycerin. Troponin was more than 24,000, EKG showed ST elevation.  Emergent coronary angiogram was performed on 5/11, showed 100% stenosis in the ostial LAD to mid LAD, proximal 99% stenosis.  A drug-eluting stent was placed within the occluded LAD stent.  RCA stenosis was considered chronic.  Repeated angiogram was performed on 13, distal LAD still have 70% stenosis, proximal RCA still 99% stenosis.  But proximal LAD stenosis has resolved. Patient developed hypotension earlier this morning on 12/14.  Received IV fluid bolus.

## 2022-09-20 NOTE — Assessment & Plan Note (Addendum)
-   As needed nicotine patch ordered - Patient states he is not ready to quit

## 2022-09-20 NOTE — Assessment & Plan Note (Signed)
-   Dual antiplatelet therapy, hyperlipidemia, beta-blockade ordered

## 2022-09-20 NOTE — Assessment & Plan Note (Signed)
-   Losartan hold on admission per cardiology - Carvedilol 3.125 mg p.o. twice daily initiated

## 2022-09-20 NOTE — ED Provider Notes (Signed)
[facility] Provider Note    Event Date/Time   First MD Initiated Contact with Patient 09/20/22 1850     (approximate)   History   Chest Pain   HPI  Matthew Norton is a 76 y.o. male who comes in with EKG transmitted by EMS showing ST elevation in all the precordial leads except for V1.  This is consistent with STEMI.  Patient reports he went to get his mail and was walking back and developed substernal chest pain.  It was like his usual angina but did not go away.  He took 3 nitros without relief.  EMS arrived gave him 1 further nitro without relief.  EMS then transported him here after giving him 4 baby aspirin's which he chewed.  Patient has had stents in the past.   Patient continues to smoke cigarettes  Physical Exam   Triage Vital Signs: ED Triage Vitals  Enc Vitals Group     BP 09/20/22 1831 113/61     Pulse Rate 09/20/22 1831 84     Resp 09/20/22 1831 20     Temp 09/20/22 1831 (!) 97.3 F (36.3 C)     Temp Source 09/20/22 1831 Oral     SpO2 09/20/22 1831 99 %     Weight 09/20/22 1833 190 lb (86.2 kg)     Height 09/20/22 1833 '5\' 8"'$  (1.727 m)     Head Circumference --      Peak Flow --      Pain Score --      Pain Loc --      Pain Edu? --      Excl. in [place]? --     Most recent vital signs: Vitals:   09/20/22 1831 09/20/22 1845  BP: 113/61 (!) 108/59  Pulse: 84 71  Resp: 20 (!) 22  Temp: (!) 97.3 F (36.3 C)   SpO2: 99% 100%     General: Awake, looks uncomfortable CV:  Good peripheral perfusion.  Heart regular rate and rhythm no audible murmurs Resp:  Normal effort.  Lungs are clear Abd:  No distention.  Soft and nontender Extremities with no edema  ED Results / Procedures / Treatments   Labs (all labs ordered are listed, but only abnormal results are displayed) Labs Reviewed  CBC WITH DIFFERENTIAL/PLATELET - Abnormal; Notable for the following components:      Result Value   WBC 10.8 (*)    Neutro Abs 8.7 (*)    All other  components within normal limits  HEMOGLOBIN A1C  PROTIME-INR  APTT  COMPREHENSIVE METABOLIC PANEL  LIPID PANEL  TROPONIN I (HIGH SENSITIVITY)     EKG  EKG here read and interpreted by me shows normal sinus rhythm rate of 71 irregularly irregular due to I think respiratory variation.  There are P waves in front of all QRS complexes that I can see.  Normal axis right bundle branch block there is ST segment elevation now only in V2 and V4. EKG #2 read interpreted by me shows sinus bradycardia at a rate of 57 normal axis still with some irregularity right bundle branch block still with ST segment elevation in V2 and V4  RADIOLOGY    PROCEDURES:  Critical Care performed: Critical care time half an hour this includes interpreting the initial 2 EMS EKGs speaking with Dr. Velva Harman and supervising the patient's care until cardiology arrived.  Procedures   MEDICATIONS ORDERED IN ED: Medications  0.9 %  sodium chloride infusion (  Intravenous New Bag/Given 09/20/22 1840)  aspirin chewable tablet 324 mg (324 mg Oral Not Given 09/20/22 1837)  nitroGLYCERIN (NITROSTAT) SL tablet 0.4 mg (0.4 mg Sublingual Given 09/20/22 1839)  heparin injection 5,000 Units (5,000 Units Intravenous Given 09/20/22 1829)  morphine (PF) 2 MG/ML injection 2 mg (2 mg Intravenous Given 09/20/22 1836)  ticagrelor (BRILINTA) tablet 180 mg (180 mg Oral Given 09/20/22 1829)  nitroGLYCERIN (NITROGLYN) 2 % ointment 0.5 inch (0.5 inches Topical Given 09/20/22 1848)     IMPRESSION / MDM / ASSESSMENT AND PLAN / ED COURSE  I reviewed the triage vital signs and the nursing notes.                              Differential diagnosis includes, but is not limited to, STEMI  Patient's presentation is most consistent with acute presentation with potential threat to life or bodily function.  The patient is on the cardiac monitor to evaluate for evidence of arrhythmia and/or significant heart rate changes.  No significant  arrhythmias were seen   Patient's pain did not improve markedly.  He received heparin bolus and Brilinta.  And he received sublingual nitro and 2 of morphine without any change in his pain.  We are now putting on half inch of Nitropaste.  His blood pressures come down somewhat from the 397 systolic immediately before the sublingual nitro.  Giving him about 250 cc of fluid  FINAL CLINICAL IMPRESSION(S) / ED DIAGNOSES   Final diagnoses:  ST elevation myocardial infarction (STEMI), unspecified artery ([place])     Rx / DC Orders   ED Discharge Orders     None        Note:  This document was prepared using Dragon voice recognition software and may include unintentional dictation errors.   Nena Polio, MD 09/20/22 [phone]

## 2022-09-20 NOTE — Assessment & Plan Note (Signed)
-   Rosuvastatin 10 mg daily ordered per cardiology

## 2022-09-20 NOTE — H&P (Addendum)
History and Physical   [name] ZLD:[id number] DOB: [birth year] DOA: 09/20/2022  PCP: Tonia Ghent, MD  Outpatient Specialists: Dr. Saralyn Pilar, [facility] cardiology Patient coming from: home via EMS  I have personally briefly reviewed patient's old medical records in [place].  Chief Concern: Chest pain  HPI: [name] is a 76 year old male with history of CAD status post 2 heart catheterization, hypertension, current tobacco use, hyperlipidemia, who presents emergency department for chief concerns of chest pain not relieved with home nitroglycerin.  Initial vitals in the emergency department showed temperature of 97.3, respiration rate of 22, heart rate 71, blood pressure 108/59, SpO2 100% on room air.  Serum sodium is 138, potassium 4.5, chloride 105, bicarb 24, BUN of 10, serum creatinine of 1.20, EGFR greater than 60, nonfasting blood glucose 131, WBC 10.8, hemoglobin 16.2, platelets of 209.  EKG showed sinus rhythm with rate of 57, QTc 420, ST T wave changes in V1 and ST elevation in leads V2 to V3.  ED treatment: Aspirin 325 mg p.o. one-time dose, patient was started on heparin bolus and GGT.    Patient met criteria for STEMI, code STEMI was called. Patient was taken to the Cath Lab for cardiac intervention.  Patient is postcatheterization, hospitalist service was consulted for admission to the hospital. ------------------------------------- At bedside patient is able to tell me his name, age, he knows he is in the hospital.  He does not appear to be in acute distress at bedside.  He reports he has been having chest pressure for the last 2 months however the chest pressure has responded to nitroglycerin except today.  He reports he has been having shortness of breath with exertion.  At bedside he reports the chest pressure that he experienced today has completely gone away.  He does endorse that he has residual chest pain that has been present for a long  time and related to spinal pain. This residual pain is a 2/10 level.   He denies nausea, vomiting, dysuria, diarrhea, syncope.  Social history: He lives at home by himself.  He endorses tobacco use smoking 0.75 packs of cigarettes per day.  He denies EtOH and recreational drug use.  He is retired.  ROS: Constitutional: no weight change, no fever ENT/Mouth: no sore throat, no rhinorrhea Eyes: no eye pain, no vision changes Cardiovascular: + chest pain, + dyspnea,  no edema, no palpitations Respiratory: no cough, no sputum, no wheezing Gastrointestinal: no nausea, no vomiting, no diarrhea, no constipation Genitourinary: no urinary incontinence, no dysuria, no hematuria Musculoskeletal: no arthralgias, no myalgias Skin: no skin lesions, no pruritus, Neuro: + weakness, no loss of consciousness, no syncope Psych: no anxiety, no depression, no decrease appetite Heme/Lymph: no bruising, no bleeding  Assessment/Plan  Principal Problem:   Acute ST elevation myocardial infarction (STEMI) due to occlusion of left anterior descending (LAD) coronary artery (HCC) Active Problems:   CAD in native artery   HTN (hypertension)   HLD (hyperlipidemia)   Spinal stenosis in cervical region   Chronic venous insufficiency   Anxiety and depression   SOB (shortness of breath) on exertion   Tobacco use disorder   BPH with obstruction/lower urinary tract symptoms   Insomnia   Assessment and Plan:  * Acute ST elevation myocardial infarction (STEMI) due to occlusion of left anterior descending (LAD) coronary artery (HCC) - Status post successful DES to proximal LAD - Patient also required 2 additional stents at prior areas of stent placement - Per interventional  cardiologist, patient also was noted to have 99% stenosis at RCA however this appears chronic and does have collateral.  He states that patient will need another catheterization (likely) Wednesday due to patient receiving contrast today and  complicated catheterization - Per cardiologist patient does not require heparin GGT - Aspirin 81 mg daily resumed and Brilinta - Patient will require Aggrastat gtt. for 12 hours, this has been ordered by cardiology  Insomnia - As needed melatonin 5 mg nightly pain for sleep ordered  Tobacco use disorder - As needed nicotine patch ordered - Patient states he is not ready to quit  SOB (shortness of breath) on exertion - Presumed secondary to angina secondary to CAD - Complete echo ordered per cardiology  Chronic venous insufficiency - Patient would benefit from tobacco cessation however he is not ready to quit  Spinal stenosis in cervical region - This is chronic  HLD (hyperlipidemia) - Rosuvastatin 10 mg daily ordered per cardiology  HTN (hypertension) - Losartan hold on admission per cardiology - Carvedilol 3.125 mg p.o. twice daily initiated  CAD in native artery - Dual antiplatelet therapy, hyperlipidemia, beta-blockade ordered  Chart reviewed.   DVT prophylaxis: Patient received heparin on admission; heparin 5000 units subcutaneous every 8 hours, 3 doses ordered for 09/21/2022 in anticipation of cardiac catheterization on 09/22/2022 Code Status: Full code Diet: Heart healthy Family Communication: No Disposition Plan: Pending clinical course Consults called: Cardiology Admission status: Stepdown, inpatient  Past Medical History:  Diagnosis Date   Anxiety    takes Valium daily as needed   Arthritis    Cataracts, bilateral    Complication of anesthesia    slow to wake up after one surgery   Coronary artery disease    Depression    Hyperlipemia    has tried meds but not on any bc of joint aches and pains   Hypertension    takes Metoprolol and Losartan daily   Insomnia    Joint pain    Peripheral edema    Shortness of breath    with exertion    Weakness    numbness in both hands and feet.   Past Surgical History:  Procedure Laterality Date   ANTERIOR  CERVICAL DECOMP/DISCECTOMY FUSION N/A 03/24/2015   Procedure: Cervical four- five, anterior cervical decompression with fusion, plating and bonegraft;  Surgeon: Jovita Gamma, MD;  Location: [place] [facility];  Service: Neurosurgery;  Laterality: N/A;  C45 anterior cervical decompression with fusion plating and bonegraft   BACK SURGERY  2001   lumb lam   CARDIAC CATHETERIZATION  08,11   stents x2   [place]   fx rt    COLONOSCOPY WITH PROPOFOL N/A 03/31/2017   Procedure: COLONOSCOPY WITH PROPOFOL;  Surgeon: Jonathon Bellows, MD;  Location: [facility] ENDOSCOPY;  Service: Endoscopy;  Laterality: N/A;   COLONOSCOPY WITH PROPOFOL N/A 08/25/2017   Procedure: COLONOSCOPY WITH PROPOFOL;  Surgeon: Jonathon Bellows, MD;  Location: [facility] ENDOSCOPY;  Service: Endoscopy;  Laterality: N/A;   COLONOSCOPY WITH PROPOFOL N/A 03/22/2022   Procedure: COLONOSCOPY WITH PROPOFOL;  Surgeon: Jonathon Bellows, MD;  Location: [facility] ENDOSCOPY;  Service: Gastroenterology;  Laterality: N/A;   CORONARY ANGIOPLASTY     2 stents   ESOPHAGOGASTRODUODENOSCOPY     HERNIA REPAIR  2007   rt and lt ing    NAILBED REPAIR Left 09/13/2013   Procedure: LEFT THUMB NAIL REMOVAL, NAIL BED RECONSTRUCTION PARTIAL EXCISION DISTAL PHALANX WITH COMPLEX RECONSTRUCTION ;  Surgeon: Roseanne Kaufman, MD;  Location: [facility];  Service: Orthopedics;  Laterality: Left;   parotid mass removed     REPLANTATION THUMB  2003   left   Social History:  reports that he has been smoking cigarettes. He has a 50.00 pack-year smoking history. He has never used smokeless tobacco. He reports that he does not drink alcohol and does not use drugs.  Allergies  Allergen Reactions   Methocarbamol Shortness Of Breath and Palpitations   Bupropion Other (See Comments)   Hydrochlorothiazide Other (See Comments)    Unspecified   Paroxetine Hcl Other (See Comments)    Unspecified   Tramadol Hcl Other (See Comments)    Nightmares    Venlafaxine Other (See  Comments)    Unspecified   Flexeril [Cyclobenzaprine] Other (See Comments)    Likely sedation reaction   Gabapentin Other (See Comments)    Headache,weakness   Hydroxyzine Other (See Comments)    Lack of effect for anxiety.    Lipitor [Atorvastatin] Other (See Comments)    aches   Pravastatin Other (See Comments)    nightmares   Family History  Problem Relation Age of Onset   Hypertension Mother    Cancer Mother    Cancer Father    Colon cancer Father    Prostate cancer Father    Family history: Family history reviewed and not pertinent  Prior to Admission medications   Medication Sig Start Date End Date Taking? Authorizing Provider  acetaminophen (TYLENOL) 500 MG tablet Take 2 tablets (1,000 mg total) by mouth every 8 (eight) hours as needed (for pain). 03/01/16   Tonia Ghent, MD  aspirin 81 MG tablet Take 81 mg by mouth daily.    [provider]  cyanocobalamin (,VITAMIN B-12,) 1000 MCG/ML injection 1000 mcg IM every 14 days 08/16/20   Tonia Ghent, MD  diazepam (VALIUM) 5 MG tablet TAKE 1/2 TO 1 (ONE-HALF TO ONE) TABLET BY MOUTH ONCE DAILY IN THE MORNING AND 1 TO 2 TABLETS BY MOUTH NIGHTLY-MAX OF 3 TABLETS PER DAY 07/27/22   Tonia Ghent, MD  fluticasone (FLONASE) 50 MCG/ACT nasal spray Place 2 sprays into both nostrils daily. For allergies 04/29/20   Tonia Ghent, MD  loratadine (CLARITIN) 10 MG tablet Take 10 mg by mouth daily.    [provider]  losartan (COZAAR) 25 MG tablet Take 25 mg by mouth daily.    [provider]  nitroGLYCERIN (NITROSTAT) 0.4 MG SL tablet Place 1 tablet (0.4 mg total) under the tongue every 5 (five) minutes as needed for chest pain. Max 3 doses in 15 minutes. 02/19/22   Tonia Ghent, MD  OVER THE COUNTER MEDICATION Beta Prostate, take one by mouth daily    [provider]   Physical Exam: Vitals:   09/20/22 2010 09/20/22 2030 09/20/22 2035 09/20/22 2100  BP: (!) 133/92  122/76   Pulse: (!) 0  (!) 0 75   Resp:   (!) 21   Temp:   97.6 F (36.4 C) 97.9 F (36.6 C)  TempSrc:   Oral   SpO2: 99%  97%   Weight:   85.4 kg   Height:   _0  (1.727 m)    Constitutional: appears age-appropriate, NAD, calm, comfortable Eyes: PERRL, lids and conjunctivae normal ENMT: Mucous membranes are moist. Posterior pharynx clear of any exudate or lesions. Age-appropriate dentition. Hearing appropriate Neck: normal, supple, no masses, no thyromegaly Respiratory: clear to auscultation bilaterally, no wheezing, no crackles. Normal respiratory effort. No accessory muscle  use.  Cardiovascular: Regular rate and rhythm, no murmurs / rubs / gallops. No extremity edema. 2+ pedal pulses. No carotid bruits.  Abdomen: no tenderness, no masses palpated, no hepatosplenomegaly. Bowel sounds positive.  Musculoskeletal: no clubbing / cyanosis. No joint deformity upper and lower extremities. Good ROM, no contractures, no atrophy. Normal muscle tone.  Skin: no rashes, lesions, ulcers. No induration Neurologic: Sensation intact. Strength 5/5 in all 4.  Psychiatric: Normal judgment and insight. Alert and oriented x 3. Normal mood.   EKG: independently reviewed, showing sinus rhythm with rate of 57, QTc 420, ST-T wave changes in precordial leads.  Chest x-ray on Admission: I personally reviewed and I agree with radiologist reading as below.  CARDIAC CATHETERIZATION  Result Date: 09/20/2022   Mid Cx lesion is 50% stenosed.   Ost LAD to Mid LAD lesion is 100% stenosed.   Mid LM to Dist LM lesion is 50% stenosed.   Dist LAD lesion is 70% stenosed.   Prox RCA lesion is 99% stenosed.   Prox RCA to Mid RCA lesion is 40% stenosed.   Dist RCA lesion is 40% stenosed.   A drug-eluting stent was successfully placed using a STENT ONYX FRONTIER 3.5X26.   Post intervention, there is a 0% residual stenosis.   There is moderate left ventricular systolic dysfunction.   The left ventricular ejection fraction is 25-35% by visual estimate.  1.  Anterior ST elevation myocardial infarction due to an occluded proximal LAD stent (stent thrombosis in addition to significant in-stent restenosis).  There is also significant stenosis extending distal to the stent.  There is moderate distal left main stenosis and moderate left circumflex disease.  The proximal RCA severely diseased but appears to be chronic with some right to right and left-to-right collaterals. 2.  Moderately reduced LV systolic function with severe mid to distal anterior and apical hypokinesis. 3.  Moderately elevated left ventricular end-diastolic pressure at 28 mmHg. 4.  Successful angioplasty and 2 overlapped drug-eluting stent placement to the proximal LAD extending beyond the previously placed stent and all the way back to the ostium. Recommendations: Dual antiplatelet therapy for at least 12 months and preferably intermittently. Aggrastat infusion for 12 hours. Staged RCA PCI in 2 days before hospital discharge. History of intolerance to atorvastatin and pravastatin.  Will try small dose rosuvastatin. [facility] cardiology to follow   DG Chest Port 1 View  Result Date: 09/20/2022 CLINICAL DATA:  Chest pain beginning at 1400 hours after walking to the mailbox, history coronary artery disease post MI x2 and coronary stenting x3 EXAM: PORTABLE CHEST 1 VIEW COMPARISON:  Portable exam 1845 hours compared to 07/05/2019 FINDINGS: External pacing leads project over chest. Normal heart size, mediastinal contours, and pulmonary vascularity. Chronic accentuation of basilar markings stable. Lungs otherwise clear. No pulmonary infiltrate, pleural effusion, or pneumothorax. Prior cervical spine fusion. IMPRESSION: No acute abnormalities. Electronically Signed   By: Lavonia Dana M.D.   On: 09/20/2022 19:12    Labs on Admission: I have personally reviewed following labs  CBC: Recent Labs  Lab 09/20/22 1836  WBC 10.8*  NEUTROABS 8.7*  HGB 16.2  HCT 47.5  MCV 95.6  PLT 627   Basic Metabolic  Panel: Recent Labs  Lab 09/20/22 1836  NA 138  K 4.5  CL 105  CO2 24  GLUCOSE 131*  BUN 10  CREATININE 1.20  CALCIUM 10.0   GFR: Estimated Creatinine Clearance: 56.6 mL/min (by C-G formula based on SCr of 1.2 mg/dL).  Liver Function Tests:  Recent Labs  Lab 09/20/22 1836  AST 23  ALT 15  ALKPHOS 61  BILITOT 1.1  PROT 8.2*  ALBUMIN 4.4   Lipid Profile: Recent Labs    09/20/22 1836  CHOL 232*  HDL 32*  LDLCALC 157*  TRIG 217*  CHOLHDL 7.3   Urine analysis:    Component Value Date/Time   COLORURINE STRAW (A) 04/25/2016 1155   APPEARANCEUR CLEAR (A) 04/25/2016 1155   LABSPEC 1.009 04/25/2016 1155   PHURINE 6.0 04/25/2016 1155   GLUCOSEU NEGATIVE 04/25/2016 1155   HGBUR 1+ (A) 04/25/2016 1155   BILIRUBINUR neg 02/19/2022 1047   KETONESUR NEGATIVE 04/25/2016 1155   PROTEINUR Negative 02/19/2022 1047   PROTEINUR NEGATIVE 04/25/2016 1155   UROBILINOGEN 0.2 02/19/2022 1047   NITRITE neg 02/19/2022 1047   NITRITE NEGATIVE 04/25/2016 1155   LEUKOCYTESUR Negative 02/19/2022 1047   Dr. Tobie Poet [facility]  If 7PM-7AM, please contact overnight-coverage provider If 7AM-7PM, please contact day coverage provider [email or web address]  09/20/2022, 10:25 PM

## 2022-09-20 NOTE — Assessment & Plan Note (Signed)
-   As needed melatonin 5 mg nightly pain for sleep ordered

## 2022-09-20 NOTE — Assessment & Plan Note (Signed)
This is chronic.  ?

## 2022-09-21 ENCOUNTER — Encounter: Payer: Self-pay | Admitting: Cardiovascular Disease

## 2022-09-21 ENCOUNTER — Inpatient Hospital Stay
Admit: 2022-09-21 | Discharge: 2022-09-21 | Disposition: A | Discharge status: Home / Self Care | Payer: Medicare Other | Attending: Internal Medicine | Admitting: Internal Medicine

## 2022-09-21 DIAGNOSIS — Z720 Tobacco use: Secondary | ICD-10-CM

## 2022-09-21 DIAGNOSIS — I10 Essential (primary) hypertension: Secondary | ICD-10-CM

## 2022-09-21 LAB — BASIC METABOLIC PANEL
Anion gap: 5 (ref 5–15)
BUN: 12 mg/dL (ref 8–23)
CO2: 23 mmol/L (ref 22–32)
Calcium: 9.1 mg/dL (ref 8.9–10.3)
Chloride: 108 mmol/L (ref 98–111)
Creatinine, Ser: 1.02 mg/dL (ref 0.61–1.24)
GFR, Estimated: >60 mL/min (ref >60)
Glucose, Bld: 106 mg/dL — ABNORMAL HIGH (ref 70–99)
Potassium: 4.5 mmol/L (ref 3.5–5.1)
Sodium: 136 mmol/L (ref 135–145)

## 2022-09-21 LAB — ECHOCARDIOGRAM COMPLETE
AR max vel: 2.6 cm2
AV Area VTI: 2.72 cm2
AV Area mean vel: 2.54 cm2
AV Mean grad: 3 mmHg
AV Peak grad: 5.1 mmHg
Ao pk vel: 1.13 m/s
Area-P 1/2: 6.02 cm2
Height: 68 in
S' Lateral: 3.3 cm
Weight: 3012.37 oz

## 2022-09-21 LAB — HEPATIC FUNCTION PANEL
ALT: 89 U/L — ABNORMAL HIGH (ref 0–44)
AST: 361 U/L — ABNORMAL HIGH (ref 15–41)
Albumin: 3.7 g/dL (ref 3.5–5.0)
Alkaline Phosphatase: 50 U/L (ref 38–126)
Bilirubin, Direct: 0.1 mg/dL (ref 0.0–0.2)
Indirect Bilirubin: 0.9 mg/dL (ref 0.3–0.9)
Total Bilirubin: 1 mg/dL (ref 0.3–1.2)
Total Protein: 6.7 g/dL (ref 6.5–8.1)

## 2022-09-21 LAB — CBC
HCT: 41.5 % (ref 39.0–52.0)
Hemoglobin: 14.4 g/dL (ref 13.0–17.0)
MCH: 32.6 pg (ref 26.0–34.0)
MCHC: 34.7 g/dL (ref 30.0–36.0)
MCV: 93.9 fL (ref 80.0–100.0)
Platelets: 170 10*3/uL (ref 150–400)
RBC: 4.42 MIL/uL (ref 4.22–5.81)
RDW: 12 % (ref 11.5–15.5)
WBC: 8 10*3/uL (ref 4.0–10.5)
nRBC: 0 % (ref 0.0–0.2)

## 2022-09-21 LAB — GLUCOSE, CAPILLARY: Glucose-Capillary: 98 mg/dL (ref 70–99)

## 2022-09-21 LAB — HEMOGLOBIN A1C
Hgb A1c MFr Bld: 5.6 % (ref 4.8–5.6)
Mean Plasma Glucose: 114 mg/dL

## 2022-09-21 LAB — POCT ACTIVATED CLOTTING TIME: Activated Clotting Time: 406 seconds

## 2022-09-21 LAB — BRAIN NATRIURETIC PEPTIDE: B Natriuretic Peptide: 132.4 pg/mL — ABNORMAL HIGH (ref 0.0–100.0)

## 2022-09-21 MED ORDER — FUROSEMIDE 10 MG/ML IJ SOLN
40.0000 mg | Freq: Once | INTRAMUSCULAR | Status: AC
  Administered 2022-09-21: 40 mg via INTRAVENOUS
  Filled 2022-09-21: qty 4

## 2022-09-21 MED ORDER — LIDOCAINE 5 % EX PTCH
1.0000 | MEDICATED_PATCH | CUTANEOUS | Status: DC
  Administered 2022-09-21 – 2022-09-22 (×2): 1 via TRANSDERMAL
  Filled 2022-09-21 (×3): qty 1

## 2022-09-21 MED ORDER — CLOPIDOGREL BISULFATE 75 MG PO TABS
75.0000 mg | ORAL_TABLET | Freq: Every day | ORAL | Status: DC
  Administered 2022-09-22 – 2022-09-24 (×3): 75 mg via ORAL
  Filled 2022-09-21 (×3): qty 1

## 2022-09-21 MED ORDER — CLOPIDOGREL BISULFATE 75 MG PO TABS
300.0000 mg | ORAL_TABLET | Freq: Once | ORAL | Status: AC
  Administered 2022-09-21: 300 mg via ORAL
  Filled 2022-09-21: qty 4

## 2022-09-21 MED ORDER — PERFLUTREN LIPID MICROSPHERE
1.0000 mL | INTRAVENOUS | Status: AC | PRN
  Administered 2022-09-21: 3 mL via INTRAVENOUS

## 2022-09-21 NOTE — Progress Notes (Signed)
PROGRESS NOTE  Matthew Norton    DOB: Aug 05, 1946, 76 y.o.  PYK:[id number]    Code Status: Full Code   DOA: 09/20/2022   LOS: 1   Brief hospital course  Matthew Norton is a 76 y.o. male with a PMH significant for CAD status post 2 heart catheterization, hypertension, current tobacco use, hyperlipidemia.  They presented from home to the ED via EMS on 09/20/2022 with chest pressure for 2 months which responded well to nitroglycerin but stopped responding day of presentation. Occurred after walking to mailbox. Received ASA from EMS en route.  In the ED, it was found that they had temperature of 97.3, respiration rate of 22, heart rate 71, blood pressure 108/59, SpO2 100% on room air. Chest pain was 2/10 on arrival. Significant findings included: Labs: Serum sodium is 138, potassium 4.5, chloride 105, bicarb 24, BUN of 10, serum creatinine of 1.20, EGFR greater than 60, nonfasting blood glucose 131, WBC 10.8, hemoglobin 16.2, platelets of 209.  Imaging:  EKG showed sinus rhythm with rate of 57, QTc 420, ST T wave changes in V1 and ST elevation in leads V2 to V3.   They were initially treated with ASA and started on heparin gtt.  Cardiology was consulted and he was brought to cath lab for urgent PCI.  Patient was admitted to medicine service for further workup and management of other chronic conditions in setting of STEMI as outlined in detail below.  09/21/22 -stable  Assessment & Plan  Principal Problem:   Acute ST elevation myocardial infarction (STEMI) due to occlusion of left anterior descending (LAD) coronary artery (HCC) Active Problems:   CAD in native artery   HTN (hypertension)   HLD (hyperlipidemia)   Spinal stenosis in cervical region   Chronic venous insufficiency   Anxiety and depression   SOB (shortness of breath) on exertion   Tobacco use disorder   BPH with obstruction/lower urinary tract symptoms   Insomnia  Acute STEMI due to occlusion of LAD  CAD- Status post  successful DES to proximal LAD Per cardiology, Patient also required 2 additional stents at prior areas of stent placement - Per interventional cardiologist, patient also was noted to have 99% stenosis at RCA however this appears chronic and does have collateral.   Repeat cath planned for 12/13 due to extensive disease.  Remarkably, echo performed 12/12 is overall well appearing. Ef 55-60% with normal LV function - cardiology managing, appreciate care - Aspirin 81 mg daily resumed, add plavix - Brilinta discontinued for possible contribution to SOB - diuresis per cards - strict I/O - nitro PRN - statin therapy - BB as BP tolerates - PT/OT evaluation s/p resolution of acute phase  Arrhythmia- short runs of vtach on tele overnight. Patient asymptomatic. Self resolved. Cardiology aware - continue cardiac monitring   HTN - Losartan hold on admission per cardiology - Carvedilol 3.125 mg p.o. twice daily initiated  Spinal stenosis in cervical region - This is chronic  Insomnia - As needed melatonin 5 mg nightly pain for sleep ordered   Tobacco use disorder - As needed nicotine patch ordered - Patient states he is not ready to quit  Body mass index is 28.63 kg/m.  VTE ppx: heparin injection 5,000 Units Start: 09/21/22 0600 Place TED hose Start: 09/20/22 2127   Diet:     Diet   Diet Heart Room service appropriate? Yes; Fluid consistency: Thin   Consultants: Cardiology   Subjective 09/21/22    Pt reports abnormal sensation in  chest which is not painful. Otherwise no complaints. Questions and concerns addressed at time of encounter.    Objective   Vitals:   09/21/22 0400 09/21/22 0500 09/21/22 0617 09/21/22 0700  BP: 104/68 99/60 115/66 (!) 101/57  Pulse: 69 70 76 66  Resp: 20 (!) 21 18 (!) 22  Temp: 98.1 F (36.7 C)     TempSrc: Oral     SpO2: 96% 95% 99% 98%  Weight:      Height:        Intake/Output Summary (Last 24 hours) at 09/21/2022 0832 Last data filed  at 09/21/2022 0600 Gross per 24 hour  Intake 350.44 ml  Output 650 ml  Net -299.56 ml   Filed Weights   09/20/22 1833 09/20/22 2035  Weight: 86.2 kg 85.4 kg     Physical Exam: General: awake, alert, NAD HEENT: atraumatic, clear conjunctiva, anicteric sclera, MMM, hearing grossly normal Respiratory: normal respiratory effort. Cardiovascular: quick capillary refill, normal S1/S2, RRR, no JVD, murmurs Nervous: A&O x3. no gross focal neurologic deficits, normal speech Extremities: moves all equally, no edema, normal tone Skin: dry, intact, normal temperature, normal color. No rashes, lesions or ulcers on exposed skin Psychiatry: normal mood, congruent affect  Labs   I have personally reviewed the following labs and imaging studies CBC    Component Value Date/Time   WBC 8.0 09/21/2022 0240   RBC 4.42 09/21/2022 0240   HGB 14.4 09/21/2022 0240   HGB 15.6 10/31/2014 1043   HCT 41.5 09/21/2022 0240   HCT 47.4 10/31/2014 1043   PLT 170 09/21/2022 0240   PLT 209 10/31/2014 1043   MCV 93.9 09/21/2022 0240   MCV 95 10/31/2014 1043   MCH 32.6 09/21/2022 0240   MCHC 34.7 09/21/2022 0240   RDW 12.0 09/21/2022 0240   RDW 13.9 10/31/2014 1043   LYMPHSABS 1.5 09/20/2022 1836   LYMPHSABS 2.1 10/31/2014 1043   MONOABS 0.5 09/20/2022 1836   MONOABS 0.5 10/31/2014 1043   EOSABS 0.0 09/20/2022 1836   EOSABS 0.1 10/31/2014 1043   BASOSABS 0.0 09/20/2022 1836   BASOSABS 0.0 10/31/2014 1043      Latest Ref Rng & Units 09/21/2022    2:40 AM 09/20/2022    6:36 PM 05/31/2022    2:20 PM  BMP  Glucose 70 - 99 mg/dL 106  131  87   BUN 8 - 23 mg/dL _0 Creatinine 0.61 - 1.24 mg/dL 1.02  1.20  1.18   Sodium 135 - 145 mmol/L 136  138  137   Potassium 3.5 - 5.1 mmol/L 4.5  4.5  4.7   Chloride 98 - 111 mmol/L 108  105  103   CO2 22 - 32 mmol/L _1 Calcium 8.9 - 10.3 mg/dL 9.1  10.0  9.8     CARDIAC CATHETERIZATION  Result Date: 09/20/2022   Mid Cx lesion is 50%  stenosed.   Ost LAD to Mid LAD lesion is 100% stenosed.   Mid LM to Dist LM lesion is 50% stenosed.   Dist LAD lesion is 70% stenosed.   Prox RCA lesion is 99% stenosed.   Prox RCA to Mid RCA lesion is 40% stenosed.   Dist RCA lesion is 40% stenosed.   A drug-eluting stent was successfully placed using a STENT ONYX FRONTIER 3.5X26.   Post intervention, there is a 0% residual stenosis.   There is moderate left ventricular systolic dysfunction.   The left  ventricular ejection fraction is 25-35% by visual estimate. 1.  Anterior ST elevation myocardial infarction due to an occluded proximal LAD stent (stent thrombosis in addition to significant in-stent restenosis).  There is also significant stenosis extending distal to the stent.  There is moderate distal left main stenosis and moderate left circumflex disease.  The proximal RCA severely diseased but appears to be chronic with some right to right and left-to-right collaterals. 2.  Moderately reduced LV systolic function with severe mid to distal anterior and apical hypokinesis. 3.  Moderately elevated left ventricular end-diastolic pressure at 28 mmHg. 4.  Successful angioplasty and 2 overlapped drug-eluting stent placement to the proximal LAD extending beyond the previously placed stent and all the way back to the ostium. Recommendations: Dual antiplatelet therapy for at least 12 months and preferably intermittently. Aggrastat infusion for 12 hours. Staged RCA PCI in 2 days before hospital discharge. History of intolerance to atorvastatin and pravastatin.  Will try small dose rosuvastatin. [facility] cardiology to follow   DG Chest Port 1 View  Result Date: 09/20/2022 CLINICAL DATA:  Chest pain beginning at 1400 hours after walking to the mailbox, history coronary artery disease post MI x2 and coronary stenting x3 EXAM: PORTABLE CHEST 1 VIEW COMPARISON:  Portable exam 1845 hours compared to 07/05/2019 FINDINGS: External pacing leads project over chest. Normal heart  size, mediastinal contours, and pulmonary vascularity. Chronic accentuation of basilar markings stable. Lungs otherwise clear. No pulmonary infiltrate, pleural effusion, or pneumothorax. Prior cervical spine fusion. IMPRESSION: No acute abnormalities. Electronically Signed   By: Lavonia Dana M.D.   On: 09/20/2022 19:12    Disposition Plan & Communication  Patient status: Inpatient  Admitted From: Home Planned disposition location: Home Anticipated discharge date: 12/15 pending cardiology clearance  Family Communication: none    Author: Richarda Osmond, DO [facility] 09/21/2022, 8:32 AM   Available by Epic secure chat 7AM-7PM. If 7PM-7AM, please contact night-coverage.  [facility] contact information found on [email or web address].

## 2022-09-21 NOTE — Progress Notes (Signed)
*  PRELIMINARY RESULTS* Echocardiogram 2D Echocardiogram has been performed.  Matthew Norton Matthew Norton 09/21/2022, 11:56 AM

## 2022-09-21 NOTE — Progress Notes (Signed)
[facility] Cardiology    SUBJECTIVE: Patient resting comfortably complains of some dyspnea and mild chest tightness.  No leg swelling no fever chills or sweats no cough.  Access site for procedure looks okay   Vitals:   09/21/22 0300 09/21/22 0400 09/21/22 0500 09/21/22 0617  BP: 112/64 104/68 99/60 115/66  Pulse: 71 69 70 76  Resp: 17 20 (!) 21 18  Temp:  98.1 F (36.7 C)    TempSrc:  Oral    SpO2: 99% 96% 95% 99%  Weight:      Height:         Intake/Output Summary (Last 24 hours) at 09/21/2022 0645 Last data filed at 09/21/2022 0600 Gross per 24 hour  Intake 350.44 ml  Output 650 ml  Net -299.56 ml      PHYSICAL EXAM  General: Well developed, well nourished, in no acute distress HEENT:  Normocephalic and atramatic Neck:  No JVD.  Lungs: Clear bilaterally to auscultation and percussion. Heart: HRRR . Normal S1 and S2 without gallops or murmurs.  Abdomen: Bowel sounds are positive, abdomen soft and non-tender  Msk:  Back normal, normal gait. Normal strength and tone for age. Extremities: No clubbing, cyanosis or edema.   Neuro: Alert and oriented X 3. Psych:  Good affect, responds appropriately   LABS: Basic Metabolic Panel: Recent Labs    09/20/22 1836 09/21/22 0240  NA 138 136  K 4.5 4.5  CL 105 108  CO2 24 23  GLUCOSE 131* 106*  BUN 10 12  CREATININE 1.20 1.02  CALCIUM 10.0 9.1   Liver Function Tests: Recent Labs    09/20/22 1836 09/21/22 0240  AST 23 361*  ALT 15 89*  ALKPHOS 61 50  BILITOT 1.1 1.0  PROT 8.2* 6.7  ALBUMIN 4.4 3.7   No results for input(s): "LIPASE", "AMYLASE" in the last 72 hours. CBC: Recent Labs    09/20/22 1836 09/21/22 0240  WBC 10.8* 8.0  NEUTROABS 8.7*  --   HGB 16.2 14.4  HCT 47.5 41.5  MCV 95.6 93.9  PLT 209 170   Cardiac Enzymes: No results for input(s): "CKTOTAL", "CKMB", "CKMBINDEX", "TROPONINI" in the last 72 hours. BNP: Invalid input(s): "POCBNP" D-Dimer: No results for input(s): "DDIMER" in the last  72 hours. Hemoglobin A1C: No results for input(s): "HGBA1C" in the last 72 hours. Fasting Lipid Panel: Recent Labs    09/20/22 1836  CHOL 232*  HDL 32*  LDLCALC 157*  TRIG 217*  CHOLHDL 7.3   Thyroid Function Tests: No results for input(s): "TSH", "T4TOTAL", "T3FREE", "THYROIDAB" in the last 72 hours.  Invalid input(s): "FREET3" Anemia Panel: No results for input(s): "VITAMINB12", "FOLATE", "FERRITIN", "TIBC", "IRON", "RETICCTPCT" in the last 72 hours.  CARDIAC CATHETERIZATION  Result Date: 09/20/2022   Mid Cx lesion is 50% stenosed.   Ost LAD to Mid LAD lesion is 100% stenosed.   Mid LM to Dist LM lesion is 50% stenosed.   Dist LAD lesion is 70% stenosed.   Prox RCA lesion is 99% stenosed.   Prox RCA to Mid RCA lesion is 40% stenosed.   Dist RCA lesion is 40% stenosed.   A drug-eluting stent was successfully placed using a STENT ONYX FRONTIER 3.5X26.   Post intervention, there is a 0% residual stenosis.   There is moderate left ventricular systolic dysfunction.   The left ventricular ejection fraction is 25-35% by visual estimate. 1.  Anterior ST elevation myocardial infarction due to an occluded proximal LAD stent (stent thrombosis in addition  to significant in-stent restenosis).  There is also significant stenosis extending distal to the stent.  There is moderate distal left main stenosis and moderate left circumflex disease.  The proximal RCA severely diseased but appears to be chronic with some right to right and left-to-right collaterals. 2.  Moderately reduced LV systolic function with severe mid to distal anterior and apical hypokinesis. 3.  Moderately elevated left ventricular end-diastolic pressure at 28 mmHg. 4.  Successful angioplasty and 2 overlapped drug-eluting stent placement to the proximal LAD extending beyond the previously placed stent and all the way back to the ostium. Recommendations: Dual antiplatelet therapy for at least 12 months and preferably intermittently.  Aggrastat infusion for 12 hours. Staged RCA PCI in 2 days before hospital discharge. History of intolerance to atorvastatin and pravastatin.  Will try small dose rosuvastatin. [facility] cardiology to follow   DG Chest Port 1 View  Result Date: 09/20/2022 CLINICAL DATA:  Chest pain beginning at 1400 hours after walking to the mailbox, history coronary artery disease post MI x2 and coronary stenting x3 EXAM: PORTABLE CHEST 1 VIEW COMPARISON:  Portable exam 1845 hours compared to 07/05/2019 FINDINGS: External pacing leads project over chest. Normal heart size, mediastinal contours, and pulmonary vascularity. Chronic accentuation of basilar markings stable. Lungs otherwise clear. No pulmonary infiltrate, pleural effusion, or pneumothorax. Prior cervical spine fusion. IMPRESSION: No acute abnormalities. Electronically Signed   By: Lavonia Dana M.D.   On: 09/20/2022 19:12     Echo pending  TELEMETRY: Normal sinus rhythm rate of 72 nonspecific ST-T changes:  ASSESSMENT AND PLAN:  Principal Problem:   Acute ST elevation myocardial infarction (STEMI) due to occlusion of left anterior descending (LAD) coronary artery (HCC) Active Problems:   CAD in native artery   HTN (hypertension)   HLD (hyperlipidemia)   Spinal stenosis in cervical region   Chronic venous insufficiency   Anxiety and depression   SOB (shortness of breath) on exertion   Tobacco use disorder   BPH with obstruction/lower urinary tract symptoms   Insomnia    Plan Status post STEMI with PCI and stent performed by Dr.Arida patient is scheduled for staged procedure involving the proximal right Patient still has some mild chest tightness as well as shortness of breath no nausea vomiting EKG nonacute Continue hypertension management and control Continue statin therapy for hyperlipidemia Advised patient to refrain from tobacco abuse Because of shortness of breath I am concerned it may be related to Brilinta we will switch to Plavix load  with 300 today and then 75 mg a day thereafter discontinue Brilinta Will give dose of Lasix today for possible mild failure follow-up with a BMP Patient is a smoker may need inhalers for possible COPD type symptoms as well   Yolonda Kida, MD, 09/21/2022 6:45 AM

## 2022-09-21 NOTE — Progress Notes (Signed)
       CROSS COVER NOTE  NAME: Matthew Norton MRN: [id number] DOB : 08/10/1946 ATTENDING PHYSICIAN: Cox, Amy Delane Ginger, DO    Date of Service   09/21/2022   HPI/Events of Note   Medication request received for neck pain.  Interventions   Assessment/Plan:  Lidocaine patch    This document was prepared using Dragon voice recognition software and may include unintentional dictation errors.  Neomia Glass DNP, MBA, FNP-BC Nurse Practitioner [facility] Pager [phone]

## 2022-09-21 NOTE — TOC Initial Note (Signed)
Transition of Care [facility]) - Initial/Assessment Note    Patient Details  Name: Matthew Norton MRN: [id number] Date of Birth: [birth year]  [facility]) CM/SW Contact:    Shelbie Hutching, RN Phone Number: 09/21/2022, 4:20 PM  Clinical Narrative:                 Patient admitted to the hospital with STEMI plan for cardiac cath tomorrow.  RNCM met with patient at the bedside, patient's brother and friend, [name] are also present.  [name] is the patient's caregiver, she comes over and brings the patient food and provides all his transportation to appointments.  Patient walks with a rollator, he lives at home alone.  Patient will agree to SNF if recommended by therapy after cardiac cath.   Patient is current with PCP and uses [facility] on [address] for prescriptions.   TOC will cont to follow.   Expected Discharge Plan: [place] Barriers to Discharge: Continued Medical Work up   Patient Goals and CMS Choice Patient states their goals for this hospitalization and ongoing recovery are:: will agree to go to SNF at DC if recommended CMS Medicare.gov Compare Post Acute Care list provided to:: Patient Choice offered to / list presented to : Patient  Expected Discharge Plan and Services Expected Discharge Plan: [place]   Discharge Planning Services: CM Consult Post Acute Care Choice: [place] Living arrangements for the past 2 months: Single Family Home                 DME Arranged: N/A DME Agency: NA       HH Arranged: NA HH Agency: NA        Prior Living Arrangements/Services Living arrangements for the past 2 months: Single Family Home Lives with:: Self Patient language and need for interpreter reviewed:: Yes Do you feel safe going back to the place where you live?: Yes      Need for Family Participation in Patient Care: Yes (Comment) Care giver support system in place?: Yes (comment)   Criminal Activity/Legal  Involvement Pertinent to Current Situation/Hospitalization: No - Comment as needed  Activities of Daily Living Home Assistive Devices/Equipment: None ADL Screening (condition at time of admission) Patient's cognitive ability adequate to safely complete daily activities?: Yes Is the patient deaf or have difficulty hearing?: Yes Does the patient have difficulty seeing, even when wearing glasses/contacts?: No Does the patient have difficulty concentrating, remembering, or making decisions?: No Patient able to express need for assistance with ADLs?: Yes Does the patient have difficulty dressing or bathing?: Yes Independently performs ADLs?: No Communication: Independent Dressing (OT): Needs assistance Is this a change from baseline?: Change from baseline, expected to last >3 days Grooming: Needs assistance Is this a change from baseline?: Pre-admission baseline Feeding: Independent Bathing: Needs assistance Is this a change from baseline?: Pre-admission baseline Toileting: Needs assistance Is this a change from baseline?: Pre-admission baseline In/Out Bed: Independent Walks in Home: Independent Does the patient have difficulty walking or climbing stairs?: Yes Weakness of Legs: None Weakness of Arms/Hands: Both  Permission Sought/Granted Permission sought to share information with : Family Supports Permission granted to share information with : Yes, Verbal Permission Granted  Share Information with NAME: [place] granted to share info w Relationship: friend/ caregiver  Permission granted to share info w Contact Information: [phone]  Emotional Assessment Appearance:: Appears stated age Attitude/Demeanor/Rapport: Engaged Affect (typically observed): Accepting Orientation: : Oriented to Self,  Oriented to Place, Oriented to  Time, Oriented to Situation Alcohol / Substance Use: Not Applicable Psych Involvement: No (comment)  Admission diagnosis:  ST  elevation myocardial infarction (STEMI), unspecified artery (HCC) [I21.3] Acute ST elevation myocardial infarction (STEMI) due to occlusion of left anterior descending (LAD) coronary artery (HCC) [I21.02] Patient Active Problem List   Diagnosis Date Noted   Acute ST elevation myocardial infarction (STEMI) due to occlusion of left anterior descending (LAD) coronary artery ([place]) 09/20/2022   Rash 07/18/2022   Knee pain 02/21/2022   Anxiety 02/19/2022   Statin myopathy 05/01/2020   Insomnia 07/02/2019   Pain in both knees 12/10/2018   B12 deficiency 09/26/2018   Healthcare maintenance 08/06/2018   BPH with obstruction/lower urinary tract symptoms 08/06/2018   Shoulder pain 08/06/2018   Arthralgia 08/23/2017   Tobacco use disorder 08/23/2017   FH: colon cancer 04/30/2016   FH: prostate cancer 04/30/2016   Advance care planning 04/30/2016   Allergic rhinitis 03/15/2016   HNP (herniated nucleus pulposus) with myelopathy, 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/24/2015   SOB (shortness of breath) on exertion 11/11/2014   MI, acute, non ST segment elevation ([place]) 11/08/2014   CAD in native artery 10/27/2014   HTN (hypertension) 10/27/2014   HLD (hyperlipidemia) 10/27/2014   Spinal stenosis in cervical region 10/27/2014   Chronic venous insufficiency 10/27/2014   Anxiety and depression 10/27/2014   Swelling, mass, or lump in head and neck 10/27/2014   PCP:  Tonia Ghent, MD Pharmacy:   [facility] [address] [place] [address] [place] [address] Phone: [phone] Fax: [phone]     Social Determinants of Health (SDOH) Interventions    Readmission Risk Interventions     No data to display

## 2022-09-22 ENCOUNTER — Encounter
Admission: EM | Disposition: A | Discharge status: Home Health | Payer: Self-pay | Source: Home / Self Care | Attending: Internal Medicine

## 2022-09-22 DIAGNOSIS — F17200 Nicotine dependence, unspecified, uncomplicated: Secondary | ICD-10-CM

## 2022-09-22 DIAGNOSIS — M4802 Spinal stenosis, cervical region: Secondary | ICD-10-CM

## 2022-09-22 HISTORY — PX: CORONARY STENT INTERVENTION: CATH118234

## 2022-09-22 HISTORY — PX: LEFT HEART CATH AND CORONARY ANGIOGRAPHY: CATH118249

## 2022-09-22 LAB — POCT ACTIVATED CLOTTING TIME: Activated Clotting Time: 471 seconds

## 2022-09-22 LAB — CBC
HCT: 40.2 % (ref 39.0–52.0)
Hemoglobin: 14.1 g/dL (ref 13.0–17.0)
MCH: 32.8 pg (ref 26.0–34.0)
MCHC: 35.1 g/dL (ref 30.0–36.0)
MCV: 93.5 fL (ref 80.0–100.0)
Platelets: 144 10*3/uL — ABNORMAL LOW (ref 150–400)
RBC: 4.3 MIL/uL (ref 4.22–5.81)
RDW: 11.9 % (ref 11.5–15.5)
WBC: 6.3 10*3/uL (ref 4.0–10.5)
nRBC: 0 % (ref 0.0–0.2)

## 2022-09-22 LAB — BASIC METABOLIC PANEL
Anion gap: 6 (ref 5–15)
BUN: 13 mg/dL (ref 8–23)
CO2: 24 mmol/L (ref 22–32)
Calcium: 9.2 mg/dL (ref 8.9–10.3)
Chloride: 106 mmol/L (ref 98–111)
Creatinine, Ser: 1.06 mg/dL (ref 0.61–1.24)
GFR, Estimated: >60 mL/min (ref >60)
Glucose, Bld: 110 mg/dL — ABNORMAL HIGH (ref 70–99)
Potassium: 3.4 mmol/L — ABNORMAL LOW (ref 3.5–5.1)
Sodium: 136 mmol/L (ref 135–145)

## 2022-09-22 LAB — LIPOPROTEIN A (LPA): Lipoprotein (a): 81.8 nmol/L — ABNORMAL HIGH (ref <75.0)

## 2022-09-22 SURGERY — LEFT HEART CATH AND CORONARY ANGIOGRAPHY
Anesthesia: Moderate Sedation

## 2022-09-22 MED ORDER — VERAPAMIL HCL 2.5 MG/ML IV SOLN
INTRAVENOUS | Status: DC | PRN
  Administered 2022-09-22: 2.5 mg via INTRA_ARTERIAL

## 2022-09-22 MED ORDER — HYDRALAZINE HCL 20 MG/ML IJ SOLN: 10.0000 mg | INTRAMUSCULAR | Status: AC | PRN

## 2022-09-22 MED ORDER — ASPIRIN 81 MG PO CHEW
CHEWABLE_TABLET | ORAL | Status: AC
  Filled 2022-09-22: qty 1

## 2022-09-22 MED ORDER — ACETAMINOPHEN 325 MG PO TABS
650.0000 mg | ORAL_TABLET | ORAL | Status: DC | PRN
  Administered 2022-09-24: 650 mg via ORAL
  Filled 2022-09-22: qty 2

## 2022-09-22 MED ORDER — FENTANYL CITRATE (PF) 100 MCG/2ML IJ SOLN
INTRAMUSCULAR | Status: DC | PRN
  Administered 2022-09-22: 25 ug via INTRAVENOUS

## 2022-09-22 MED ORDER — HEPARIN (PORCINE) IN NACL 1000-0.9 UT/500ML-% IV SOLN
INTRAVENOUS | Status: AC
  Filled 2022-09-22: qty 1000

## 2022-09-22 MED ORDER — SODIUM CHLORIDE 0.9% FLUSH
3.0000 mL | Freq: Two times a day (BID) | INTRAVENOUS | Status: DC
  Administered 2022-09-22 – 2022-09-23 (×2): 3 mL via INTRAVENOUS

## 2022-09-22 MED ORDER — SODIUM CHLORIDE 0.9% FLUSH: 3.0000 mL | INTRAVENOUS | Status: DC | PRN

## 2022-09-22 MED ORDER — ASPIRIN 81 MG PO CHEW: 81.0000 mg | CHEWABLE_TABLET | ORAL | Status: DC

## 2022-09-22 MED ORDER — SODIUM CHLORIDE 0.9% FLUSH
3.0000 mL | INTRAVENOUS | Status: DC | PRN
  Administered 2022-09-23: 3 mL via INTRAVENOUS

## 2022-09-22 MED ORDER — FENTANYL CITRATE (PF) 100 MCG/2ML IJ SOLN
INTRAMUSCULAR | Status: AC
  Filled 2022-09-22: qty 2

## 2022-09-22 MED ORDER — SODIUM CHLORIDE 0.9 % WEIGHT BASED INFUSION
1.0000 mL/kg/h | INTRAVENOUS | Status: AC
  Administered 2022-09-22 (×2): 1 mL/kg/h via INTRAVENOUS

## 2022-09-22 MED ORDER — MIDAZOLAM HCL 2 MG/2ML IJ SOLN
INTRAMUSCULAR | Status: AC
  Filled 2022-09-22: qty 2

## 2022-09-22 MED ORDER — SODIUM CHLORIDE 0.9 % IV SOLN: 250.0000 mL | INTRAVENOUS | Status: DC | PRN

## 2022-09-22 MED ORDER — IOHEXOL 300 MG/ML  SOLN
INTRAMUSCULAR | Status: DC | PRN
  Administered 2022-09-22: 120 mL

## 2022-09-22 MED ORDER — SODIUM CHLORIDE 0.9 % IV SOLN
INTRAVENOUS | Status: DC | PRN
  Administered 2022-09-22: 1.75 mg/kg/h via INTRAVENOUS

## 2022-09-22 MED ORDER — MIDAZOLAM HCL 2 MG/2ML IJ SOLN
INTRAMUSCULAR | Status: DC | PRN
  Administered 2022-09-22: .5 mg via INTRAVENOUS

## 2022-09-22 MED ORDER — HEPARIN (PORCINE) IN NACL 1000-0.9 UT/500ML-% IV SOLN
INTRAVENOUS | Status: DC | PRN
  Administered 2022-09-22: 1000 mL

## 2022-09-22 MED ORDER — BIVALIRUDIN TRIFLUOROACETATE 250 MG IV SOLR
INTRAVENOUS | Status: AC
  Filled 2022-09-22: qty 250

## 2022-09-22 MED ORDER — NITROGLYCERIN 1 MG/10 ML FOR IR/CATH LAB
INTRA_ARTERIAL | Status: AC
  Filled 2022-09-22: qty 10

## 2022-09-22 MED ORDER — LABETALOL HCL 5 MG/ML IV SOLN: 10.0000 mg | INTRAVENOUS | Status: AC | PRN

## 2022-09-22 MED ORDER — HEPARIN (PORCINE) IN NACL 1000-0.9 UT/500ML-% IV SOLN
INTRAVENOUS | Status: AC
  Filled 2022-09-22: qty 500

## 2022-09-22 MED ORDER — LIDOCAINE 5 % EX PTCH
1.0000 | MEDICATED_PATCH | CUTANEOUS | Status: DC
  Administered 2022-09-22 – 2022-09-23 (×2): 1 via TRANSDERMAL
  Filled 2022-09-22 (×2): qty 1

## 2022-09-22 MED ORDER — POTASSIUM CHLORIDE CRYS ER 20 MEQ PO TBCR
40.0000 meq | EXTENDED_RELEASE_TABLET | Freq: Once | ORAL | Status: AC
  Administered 2022-09-22: 40 meq via ORAL
  Filled 2022-09-22: qty 2

## 2022-09-22 MED ORDER — ONDANSETRON HCL 4 MG/2ML IJ SOLN: 4.0000 mg | Freq: Four times a day (QID) | INTRAMUSCULAR | Status: DC | PRN

## 2022-09-22 MED ORDER — VERAPAMIL HCL 2.5 MG/ML IV SOLN
INTRAVENOUS | Status: AC
  Filled 2022-09-22: qty 2

## 2022-09-22 MED ORDER — BIVALIRUDIN BOLUS VIA INFUSION - CUPID
INTRAVENOUS | Status: DC | PRN
  Administered 2022-09-22: 64.05 mg via INTRAVENOUS

## 2022-09-22 MED ORDER — HEPARIN SODIUM (PORCINE) 1000 UNIT/ML IJ SOLN
INTRAMUSCULAR | Status: AC
  Filled 2022-09-22: qty 10

## 2022-09-22 MED ORDER — SODIUM CHLORIDE 0.9 % IV BOLUS
INTRAVENOUS | Status: AC | PRN
  Administered 2022-09-22: 300 mL via INTRAVENOUS

## 2022-09-22 MED ORDER — SODIUM CHLORIDE 0.9 % IV SOLN: INTRAVENOUS | Status: DC

## 2022-09-22 SURGICAL SUPPLY — 28 items
BALLN TREK RX 2.25X12 (BALLOONS)
BALLOON TREK RX 2.25X12 (BALLOONS) IMPLANT
BAND CMPR LRG ZPHR (HEMOSTASIS) ×1
BAND ZEPHYR COMPRESS 30 LONG (HEMOSTASIS) IMPLANT
CATH INFINITI 5 FR JL3.5 (CATHETERS) IMPLANT
CATH INFINITI 5FR JL4 (CATHETERS) IMPLANT
CATH INFINITI JR4 5F (CATHETERS) IMPLANT
CATH VISTA GUIDE 6FR JR4 SH (CATHETERS) IMPLANT
DEVICE CLOSURE MYNXGRIP 6/7F (Vascular Products) IMPLANT
DRAPE BRACHIAL (DRAPES) IMPLANT
GLIDESHEATH SLEND SS 6F .021 (SHEATH) IMPLANT
GUIDEWIRE INQWIRE 1.5J.035X260 (WIRE) IMPLANT
INQWIRE 1.5J .035X260CM (WIRE) ×1
KIT ENCORE 26 ADVANTAGE (KITS) IMPLANT
KIT SYRINGE INJ CVI SPIKEX1 (MISCELLANEOUS) IMPLANT
NDL PERC 18GX7CM (NEEDLE) IMPLANT
NEEDLE PERC 18GX7CM (NEEDLE) ×1 IMPLANT
PACK CARDIAC CATH (CUSTOM PROCEDURE TRAY) ×1 IMPLANT
PROTECTION STATION PRESSURIZED (MISCELLANEOUS) ×1
SET ATX SIMPLICITY (MISCELLANEOUS) IMPLANT
SHEATH AVANTI 6FR X 11CM (SHEATH) IMPLANT
STATION PROTECTION PRESSURIZED (MISCELLANEOUS) IMPLANT
SUT SILK 0 FSL (SUTURE) IMPLANT
TUBING CIL FLEX 10 FLL-RA (TUBING) IMPLANT
WIRE ASAHI PROWATER 180CM (WIRE) IMPLANT
WIRE GUIDERIGHT .035X150 (WIRE) IMPLANT
WIRE HITORQ VERSACORE ST 145CM (WIRE) IMPLANT
WIRE RUNTHROUGH .014X180CM (WIRE) IMPLANT

## 2022-09-22 NOTE — Progress Notes (Addendum)
[facility] Cardiology    SUBJECTIVE: Patient states he feels well still has some mild chest discomfort upper abdomen lower chest.  Patient has had some mild shortness of breath no fever feels reasonably well patient is ready to proceed with intervention today   Vitals:   09/22/22 0300 09/22/22 0400 09/22/22 0500 09/22/22 0600  BP: (!) '76/45 96/61 99/61 '$ 103/63  Pulse: 75 73 70 70  Resp: (!) 26 (!) 22 (!) 25 (!) 22  Temp:      TempSrc:      SpO2: 95% 95% 97% 96%  Weight:      Height:         Intake/Output Summary (Last 24 hours) at 09/22/2022 1194 Last data filed at 09/21/2022 1900 Gross per 24 hour  Intake 216.02 ml  Output 1350 ml  Net -1133.98 ml      PHYSICAL EXAM  General: Well developed, well nourished, in no acute distress HEENT:  Normocephalic and atramatic Neck:  No JVD.  Lungs: Clear bilaterally to auscultation and percussion. Heart: HRRR . Normal S1 and S2 without gallops or murmurs.  Abdomen: Bowel sounds are positive, abdomen soft and non-tender  Msk:  Back normal, normal gait. Normal strength and tone for age. Extremities: No clubbing, cyanosis or edema.   Neuro: Alert and oriented X 3. Psych:  Good affect, responds appropriately   LABS: Basic Metabolic Panel: Recent Labs    09/21/22 0240 09/22/22 0312  NA 136 136  K 4.5 3.4*  CL 108 106  CO2 23 24  GLUCOSE 106* 110*  BUN 12 13  CREATININE 1.02 1.06  CALCIUM 9.1 9.2   Liver Function Tests: Recent Labs    09/20/22 1836 09/21/22 0240  AST 23 361*  ALT 15 89*  ALKPHOS 61 50  BILITOT 1.1 1.0  PROT 8.2* 6.7  ALBUMIN 4.4 3.7   No results for input(s): "LIPASE", "AMYLASE" in the last 72 hours. CBC: Recent Labs    09/20/22 1836 09/21/22 0240 09/22/22 0312  WBC 10.8* 8.0 6.3  NEUTROABS 8.7*  --   --   HGB 16.2 14.4 14.1  HCT 47.5 41.5 40.2  MCV 95.6 93.9 93.5  PLT 209 170 144*   Cardiac Enzymes: No results for input(s): "CKTOTAL", "CKMB", "CKMBINDEX", "TROPONINI" in the last 72  hours. BNP: Invalid input(s): "POCBNP" D-Dimer: No results for input(s): "DDIMER" in the last 72 hours. Hemoglobin A1C: Recent Labs    09/20/22 1836  HGBA1C 5.6   Fasting Lipid Panel: Recent Labs    09/20/22 1836  CHOL 232*  HDL 32*  LDLCALC 157*  TRIG 217*  CHOLHDL 7.3   Thyroid Function Tests: No results for input(s): "TSH", "T4TOTAL", "T3FREE", "THYROIDAB" in the last 72 hours.  Invalid input(s): "FREET3" Anemia Panel: No results for input(s): "VITAMINB12", "FOLATE", "FERRITIN", "TIBC", "IRON", "RETICCTPCT" in the last 72 hours.  ECHOCARDIOGRAM COMPLETE  Result Date: 09/21/2022    ECHOCARDIOGRAM REPORT   Patient Name:   Matthew Norton Date of Exam: 09/21/2022 Medical Rec #:  [id number]    Height:       68.0 in Accession #:    [id number]   Weight:       188.3 lb Date of Birth:  Jun 11, 1946   BSA:          1.992 m Patient Age:    76 years     BP:           110/69 mmHg Patient Gender: M  HR:           77 bpm. Exam Location:  [facility] Procedure: 2D Echo, Color Doppler, Cardiac Doppler and Intracardiac            Opacification Agent Indications:     I21.9 Acute myocardial infarction, unspecified  History:         Patient has no prior history of Echocardiogram examinations.                  CAD, Signs/Symptoms:Chest Pain and Shortness of Breath; Risk                  Factors:Hypertension and Dyslipidemia.  Sonographer:     Charmayne Sheer Referring Phys:  [id number] AMY N COX Diagnosing Phys: Yolonda Kida MD IMPRESSIONS  1. Left ventricular ejection fraction, by estimation, is 55 to 60%. The left ventricle has normal function. The left ventricle has no regional wall motion abnormalities. There is mild left ventricular hypertrophy. Left ventricular diastolic parameters are consistent with Grade I diastolic dysfunction (impaired relaxation).  2. Right ventricular systolic function is normal. The right ventricular size is normal.  3. The mitral valve is normal in structure. Trivial mitral  valve regurgitation.  4. The aortic valve is normal in structure. Aortic valve regurgitation is not visualized. FINDINGS  Left Ventricle: Left ventricular ejection fraction, by estimation, is 55 to 60%. The left ventricle has normal function. The left ventricle has no regional wall motion abnormalities. Definity contrast agent was given IV to delineate the left ventricular  endocardial borders. The left ventricular internal cavity size was normal in size. There is mild left ventricular hypertrophy. Left ventricular diastolic parameters are consistent with Grade I diastolic dysfunction (impaired relaxation). Right Ventricle: The right ventricular size is normal. No increase in right ventricular wall thickness. Right ventricular systolic function is normal. Left Atrium: Left atrial size was normal in size. Right Atrium: Right atrial size was normal in size. Pericardium: There is no evidence of pericardial effusion. Mitral Valve: The mitral valve is normal in structure. Trivial mitral valve regurgitation. Tricuspid Valve: The tricuspid valve is normal in structure. Tricuspid valve regurgitation is trivial. Aortic Valve: The aortic valve is normal in structure. Aortic valve regurgitation is not visualized. Aortic valve mean gradient measures 3.0 mmHg. Aortic valve peak gradient measures 5.1 mmHg. Aortic valve area, by VTI measures 2.72 cm. Pulmonic Valve: The pulmonic valve was normal in structure. Pulmonic valve regurgitation is not visualized. Aorta: The ascending aorta was not well visualized. IAS/Shunts: No atrial level shunt detected by color flow Doppler.  LEFT VENTRICLE PLAX 2D LVIDd:         4.70 cm   Diastology LVIDs:         3.30 cm   LV e' medial:    4.57 cm/s LV PW:         1.50 cm   LV E/e' medial:  8.8 LV IVS:        1.10 cm   LV e' lateral:   7.07 cm/s LVOT diam:     2.20 cm   LV E/e' lateral: 5.7 LV SV:         48 LV SV Index:   24 LVOT Area:     3.80 cm  RIGHT VENTRICLE RV Basal diam:  2.80 cm RV S  prime:     15.20 cm/s TAPSE (M-mode): 2.2 cm LEFT ATRIUM             Index  RIGHT ATRIUM           Index LA diam:        2.90 cm 1.46 cm/m   RA Area:     12.10 cm LA Vol (A2C):   50.5 ml 25.35 ml/m  RA Volume:   25.10 ml  12.60 ml/m LA Vol (A4C):   34.1 ml 17.12 ml/m LA Biplane Vol: 41.5 ml 20.83 ml/m  AORTIC VALVE                    PULMONIC VALVE AV Area (Vmax):    2.60 cm     PV Vmax:          1.29 m/s AV Area (Vmean):   2.54 cm     PV Vmean:         86.700 cm/s AV Area (VTI):     2.72 cm     PV VTI:           0.183 m AV Vmax:           113.00 cm/s  PV Peak grad:     6.7 mmHg AV Vmean:          76.600 cm/s  PV Mean grad:     4.0 mmHg AV VTI:            0.175 m      PR End Diast Vel: 4.49 msec AV Peak Grad:      5.1 mmHg AV Mean Grad:      3.0 mmHg LVOT Vmax:         77.40 cm/s LVOT Vmean:        51.100 cm/s LVOT VTI:          0.125 m LVOT/AV VTI ratio: 0.71  AORTA Ao Root diam: 3.30 cm MITRAL VALVE MV Area (PHT): 6.02 cm    SHUNTS MV Decel Time: 126 msec    Systemic VTI:  0.12 m MV E velocity: 40.40 cm/s  Systemic Diam: 2.20 cm MV A velocity: 71.30 cm/s MV E/A ratio:  0.57 Yolonda Kida MD Electronically signed by Yolonda Kida MD Signature Date/Time: 09/21/2022/3:33:49 PM    Final    CARDIAC CATHETERIZATION  Result Date: 09/20/2022   Mid Cx lesion is 50% stenosed.   Ost LAD to Mid LAD lesion is 100% stenosed.   Mid LM to Dist LM lesion is 50% stenosed.   Dist LAD lesion is 70% stenosed.   Prox RCA lesion is 99% stenosed.   Prox RCA to Mid RCA lesion is 40% stenosed.   Dist RCA lesion is 40% stenosed.   A drug-eluting stent was successfully placed using a STENT ONYX FRONTIER 3.5X26.   Post intervention, there is a 0% residual stenosis.   There is moderate left ventricular systolic dysfunction.   The left ventricular ejection fraction is 25-35% by visual estimate. 1.  Anterior ST elevation myocardial infarction due to an occluded proximal LAD stent (stent thrombosis in addition to  significant in-stent restenosis).  There is also significant stenosis extending distal to the stent.  There is moderate distal left main stenosis and moderate left circumflex disease.  The proximal RCA severely diseased but appears to be chronic with some right to right and left-to-right collaterals. 2.  Moderately reduced LV systolic function with severe mid to distal anterior and apical hypokinesis. 3.  Moderately elevated left ventricular end-diastolic pressure at 28 mmHg. 4.  Successful angioplasty and 2 overlapped drug-eluting stent placement to the proximal LAD extending  beyond the previously placed stent and all the way back to the ostium. Recommendations: Dual antiplatelet therapy for at least 12 months and preferably intermittently. Aggrastat infusion for 12 hours. Staged RCA PCI in 2 days before hospital discharge. History of intolerance to atorvastatin and pravastatin.  Will try small dose rosuvastatin. [facility] cardiology to follow   DG Chest Port 1 View  Result Date: 09/20/2022 CLINICAL DATA:  Chest pain beginning at 1400 hours after walking to the mailbox, history coronary artery disease post MI x2 and coronary stenting x3 EXAM: PORTABLE CHEST 1 VIEW COMPARISON:  Portable exam 1845 hours compared to 07/05/2019 FINDINGS: External pacing leads project over chest. Normal heart size, mediastinal contours, and pulmonary vascularity. Chronic accentuation of basilar markings stable. Lungs otherwise clear. No pulmonary infiltrate, pleural effusion, or pneumothorax. Prior cervical spine fusion. IMPRESSION: No acute abnormalities. Electronically Signed   By: Lavonia Dana M.D.   On: 09/20/2022 19:12     Echo preserved left ventricular function 55  TELEMETRY: Normal sinus rhythm rate of about 80: No specific ST-T wave changes  ASSESSMENT AND PLAN:  Principal Problem:   Acute ST elevation myocardial infarction (STEMI) due to occlusion of left anterior descending (LAD) coronary artery (HCC) Active  Problems:   CAD in native artery   HTN (hypertension)   HLD (hyperlipidemia)   Spinal stenosis in cervical region   Chronic venous insufficiency   Anxiety and depression   SOB (shortness of breath) on exertion   Tobacco use disorder   BPH with obstruction/lower urinary tract symptoms   Insomnia   Tobacco use    Plan Status post STEMI LAD preop for staged procedure for RCA today Hypertension much improved with some episodes of hypotension will maintain adequate hydration antihypertensives as necessary Recommend Repatha or Praluent since the patient states to have allergy to statins Recommend refrain from tobacco abuse Chronic venous insufficiency recommend support stockings elevation Proceed with PCI today Recommend cardiac rehab   Yolonda Kida, MD, 09/22/2022 7:18 AM

## 2022-09-22 NOTE — Progress Notes (Signed)
Patient received from cath lab via bed accompanied by RN. Alert and oriented. NS infusing via PIV. Right femoral sheath present; CDI, no bleeding or hematoma. Attached to transducer for wave form and BP monitoring. Right radial TR band in place, CDI. HOB flat; patient advised to keep right leg straight. Femoral sheath has now been removed by cath lab personnel. Site is CDI, without bleeding or hematoma.

## 2022-09-22 NOTE — Progress Notes (Signed)
  Progress Note   Patient: Matthew Norton DOB: Aug 07, 1946 DOA: 09/20/2022     2 DOS: the patient was seen and examined on 09/22/2022   Brief hospital course: [name] is a 76 year old male with history of CAD status post 2 heart catheterization, hypertension, current tobacco use, hyperlipidemia, who presents emergency department for chief concerns of chest pain not relieved with home nitroglycerin. Troponin was more than 24,000, EKG showed ST elevation.  Emergent coronary angiogram was performed on 5/11, showed 100% stenosis in the ostial LAD to mid LAD, proximal 99% stenosis.  A drug-eluting stent was placed within the occluded LAD stent.  RCA stenosis was considered chronic.  Repeated angiogram was performed on 13, distal LAD still have 70% stenosis, proximal RCA still 99% stenosis.  But proximal LAD stenosis has resolved.     Assessment and Plan:  * Acute ST elevation myocardial infarction (STEMI) due to occlusion of left anterior descending (LAD) coronary artery (HCC) - Status post successful DES to proximal LAD - Patient also required 2 additional stents at prior areas of stent placement - Per interventional cardiologist, patient also was noted to have 99% stenosis at RCA however this appears chronic and does have collateral.   Repeated coronary angiogram today confirms patent stents. Patient is continued on aspirin, Plavix as well as Crestor.  Patient be monitored in the hospital for another day and potentially discharge home tomorrow with cardiology follow-up.  Insomnia As needed melatonin 5 mg nightly pain for sleep ordered  Tobacco use disorder Continue nicotine patch.  Chronic venous insufficiency Patient would benefit from tobacco cessation however he is not ready to quit  Spinal stenosis in cervical region Chronic.  HTN (hypertension) - Losartan hold on admission per cardiology - Carvedilol 3.125 mg p.o. twice daily initiated       Subjective:   Patient doing better today, no short of breath or chest pain.  Physical Exam: Vitals:   09/22/22 1515 09/22/22 1530 09/22/22 1545 09/22/22 1600  BP: 125/79 (!) 145/85 124/68 131/76  Pulse: 73 76 71 78  Resp: '20 15 16 19  '$ Temp:      TempSrc:      SpO2: 95% 94% 96% 96%  Weight:      Height:       General exam: Appears calm and comfortable  Respiratory system: Clear to auscultation. Respiratory effort normal. Cardiovascular system: S1 & S2 heard, RRR. No JVD, murmurs, rubs, gallops or clicks. No pedal edema. Gastrointestinal system: Abdomen is nondistended, soft and nontender. No organomegaly or masses felt. Normal bowel sounds heard. Central nervous system: Alert and oriented. No focal neurological deficits. Extremities: Symmetric 5 x 5 power. Skin: No rashes, lesions or ulcers Psychiatry: Judgement and insight appear normal. Mood & affect appropriate. ' Data Reviewed:  Reviewed results, lab results.  Family Communication:   Disposition: Status is: Inpatient Remains inpatient appropriate because: Severity of disease, inpatient procedure.  Planned Discharge Destination:  Obtain PT/OT to determine if appropriate to go home with PT.    Time spent: 35 minutes  Author: Sharen Hones, MD 09/22/2022 4:25 PM  For on call review [email or web address].

## 2022-09-23 ENCOUNTER — Encounter: Payer: Self-pay | Admitting: Cardiology

## 2022-09-23 DIAGNOSIS — D696 Thrombocytopenia, unspecified: Secondary | ICD-10-CM | POA: Insufficient documentation

## 2022-09-23 DIAGNOSIS — I9589 Other hypotension: Secondary | ICD-10-CM

## 2022-09-23 DIAGNOSIS — I959 Hypotension, unspecified: Secondary | ICD-10-CM | POA: Insufficient documentation

## 2022-09-23 LAB — CBC
HCT: 38.1 % — ABNORMAL LOW (ref 39.0–52.0)
Hemoglobin: 13.3 g/dL (ref 13.0–17.0)
MCH: 33.2 pg (ref 26.0–34.0)
MCHC: 34.9 g/dL (ref 30.0–36.0)
MCV: 95 fL (ref 80.0–100.0)
Platelets: 128 10*3/uL — ABNORMAL LOW (ref 150–400)
RBC: 4.01 MIL/uL — ABNORMAL LOW (ref 4.22–5.81)
RDW: 11.7 % (ref 11.5–15.5)
WBC: 5.3 10*3/uL (ref 4.0–10.5)
nRBC: 0 % (ref 0.0–0.2)

## 2022-09-23 LAB — BASIC METABOLIC PANEL
Anion gap: 5 (ref 5–15)
BUN: 16 mg/dL (ref 8–23)
CO2: 24 mmol/L (ref 22–32)
Calcium: 9.1 mg/dL (ref 8.9–10.3)
Chloride: 110 mmol/L (ref 98–111)
Creatinine, Ser: 1.14 mg/dL (ref 0.61–1.24)
GFR, Estimated: >60 mL/min (ref >60)
Glucose, Bld: 104 mg/dL — ABNORMAL HIGH (ref 70–99)
Potassium: 4.4 mmol/L (ref 3.5–5.1)
Sodium: 139 mmol/L (ref 135–145)

## 2022-09-23 LAB — MAGNESIUM: Magnesium: 2 mg/dL (ref 1.7–2.4)

## 2022-09-23 MED ORDER — NICOTINE POLACRILEX 2 MG MT GUM
2.0000 mg | CHEWING_GUM | OROMUCOSAL | Status: DC | PRN
  Administered 2022-09-23: 2 mg via ORAL
  Filled 2022-09-23 (×3): qty 1

## 2022-09-23 MED ORDER — SODIUM CHLORIDE 0.9 % IV BOLUS
500.0000 mL | Freq: Once | INTRAVENOUS | Status: AC
  Administered 2022-09-23: 500 mL via INTRAVENOUS

## 2022-09-23 MED ORDER — ROSUVASTATIN CALCIUM 10 MG PO TABS
20.0000 mg | ORAL_TABLET | Freq: Every evening | ORAL | Status: DC
  Administered 2022-09-23: 20 mg via ORAL
  Filled 2022-09-23: qty 2

## 2022-09-23 NOTE — Evaluation (Signed)
Occupational Therapy Evaluation Patient Details Name: Matthew Norton MRN: [id number] DOB: 06-18-1946 Today's Date: 09/23/2022   History of Present Illness Per MD notes: Matthew Norton is a 76 year old male with history of CAD, status post heart catheterization, hypertension, current tobacco use, hyperlipidemia, who presents emergency department for chief concerns of chest pain not relieved with home nitroglycerin. In the ED Troponin was found to >24,000, EKG showed ST elevation. Pt underwent emergent cardiac cath on 12/11.   Clinical Impression   Matthew Norton was seen for OT evaluation this date. Prior to hospital admission, pt was generally independent/MOD I for ADL management. He endorses sponge bathing at baseline, dressing, grooming, and toileting independently. Pt reports a friend assists him with meal prep, transportation, and IADL management. Pt lives alone in a 1 level home. Pt presents to acute OT demonstrating impaired ADL performance and functional mobility 2/2 generalized weakness, decreased activity tolerance, and impaired balance (See OT problem list). Pt currently requires SUPERVISION for bed/functional mobility and MIN A for LB ADL management.  Pt would benefit from skilled OT services to address noted impairments and functional limitations (see below for any additional details) in order to maximize safety and independence while minimizing falls risk and caregiver burden. Upon hospital discharge, recommend HHOT to maximize pt safety and return to functional independence during meaningful occupations of daily life.        Recommendations for follow up therapy are one component of a multi-disciplinary discharge planning process, led by the attending physician.  Recommendations may be updated based on patient status, additional functional criteria and insurance authorization.   Follow Up Recommendations  Home health OT     Assistance Recommended at Discharge Intermittent  Supervision/Assistance  Patient can return home with the following A little help with walking and/or transfers;A little help with bathing/dressing/bathroom;Help with stairs or ramp for entrance;Assist for transportation;Assistance with cooking/housework    Functional Status Assessment  Patient has had a recent decline in their functional status and demonstrates the ability to make significant improvements in function in a reasonable and predictable amount of time.  Equipment Recommendations  BSC/3in1    Recommendations for Other Services       Precautions / Restrictions Precautions Precautions: Fall Restrictions Weight Bearing Restrictions: No      Mobility Bed Mobility Overal bed mobility: Needs Assistance Bed Mobility: Supine to Sit, Sit to Supine     Supine to sit: Supervision, HOB elevated Sit to supine: Supervision, HOB elevated   General bed mobility comments: increased time/effort to perform    Transfers Overall transfer level: Needs assistance Equipment used: 1 person hand held assist Transfers: Sit to/from Stand Sit to Stand: Min guard, Supervision, From elevated surface           General transfer comment: Min cueing for technique.      Balance Overall balance assessment: Needs assistance Sitting-balance support: Single extremity supported, Feet supported Sitting balance-Leahy Scale: Good Sitting balance - Comments: Steady static sitting, reaching within BOS.   Standing balance support: During functional activity, Bilateral upper extremity supported, Single extremity supported Standing balance-Leahy Scale: Good                             ADL either performed or assessed with clinical judgement   ADL Overall ADL's : Needs assistance/impaired  General ADL Comments: Pt functionally limited by generalized weakness, decreased activity tolerance, and limited LB access. He requires  SUPERVISION for safety with bed/functional mobility. Anticipate MIN A for LB ADL Management.     Vision Baseline Vision/History: 1 Wears glasses Patient Visual Report: No change from baseline       Perception     Praxis      Pertinent Vitals/Pain Pain Assessment Pain Assessment: No/denies pain     Hand Dominance Right   Extremity/Trunk Assessment Upper Extremity Assessment Upper Extremity Assessment: Generalized weakness   Lower Extremity Assessment Lower Extremity Assessment: Generalized weakness       Communication Communication Communication: No difficulties   Cognition Arousal/Alertness: Awake/alert Behavior During Therapy: WFL for tasks assessed/performed Overall Cognitive Status: Within Functional Limits for tasks assessed                                 General Comments: Pleasant, conversational, eager to participate.     General Comments       Exercises Other Exercises Other Exercises: Pt educated in role of OT in acute setting, safety, falls prevention strategies, DC recs, and safe use of AE/DME for ADL management.   Shoulder Instructions      Home Living Family/patient expects to be discharged to:: Private residence Living Arrangements: Alone Available Help at Discharge: Family;Friend(s);Available PRN/intermittently Type of Home: House       Home Layout: One level     Bathroom Shower/Tub: Teacher, early years/pre: Standard     Home Equipment: Conservation officer, nature (2 wheels);Cane - quad;Cane - single point          Prior Functioning/Environment Prior Level of Function : History of Falls (last six months);Independent/Modified Independent             Mobility Comments: Pt reports using 4WW or QC for functional mobility at baseline. Generally uses QC for community mobility and [phone] for "going to the mailbox". ADLs Comments: Pt sponge bathes at baseline.        OT Problem List: Cardiopulmonary status limiting  activity;Decreased strength;Decreased activity tolerance;Decreased safety awareness;Impaired balance (sitting and/or standing);Decreased knowledge of use of DME or AE      OT Treatment/Interventions: Self-care/ADL training;Therapeutic exercise;Therapeutic activities;DME and/or AE instruction;Patient/family education;Energy conservation;Balance training    OT Goals(Current goals can be found in the care plan section) Acute Rehab OT Goals Patient Stated Goal: To go home OT Goal Formulation: With patient Time For Goal Achievement: 10/07/22 Potential to Achieve Goals: Good ADL Goals Pt Will Perform Grooming: sitting;with modified independence Pt Will Perform Lower Body Dressing: sit to/from stand;with modified independence;with adaptive equipment (c LRAD PRN) Pt Will Transfer to Toilet: bedside commode;with modified independence (c LRAD PRN) Pt Will Perform Toileting - Clothing Manipulation and hygiene: sit to/from stand;with modified independence;with caregiver independent in assisting  OT Frequency: Min 2X/week    Co-evaluation              AM-PAC OT "6 Clicks" Daily Activity     Outcome Measure Help from another person eating meals?: A Little Help from another person taking care of personal grooming?: A Little Help from another person toileting, which includes using toliet, bedpan, or urinal?: A Little Help from another person bathing (including washing, rinsing, drying)?: A Little Help from another person to put on and taking off regular upper body clothing?: A Little Help from another person to put on and taking off regular lower  body clothing?: A Little 6 Click Score: 18   End of Session Equipment Utilized During Treatment: Gait belt;Rolling walker (2 wheels) Nurse Communication: Mobility status  Activity Tolerance: Patient tolerated treatment well Patient left: in bed;with call bell/phone within reach;with bed alarm set;with family/visitor present  OT Visit Diagnosis: Other  abnormalities of gait and mobility (R26.89);Muscle weakness (generalized) (M62.81)                Time: 4643-1427 OT Time Calculation (min): 30 min Charges:  OT General Charges $OT Visit: 1 Visit OT Evaluation $OT Eval Moderate Complexity: 1 Mod OT Treatments $Self Care/Home Management : 8-22 mins  Shara Blazing, M.S., OTR/L 09/23/22, 12:37 PM

## 2022-09-23 NOTE — Progress Notes (Signed)
   09/23/22 1000  Clinical Encounter Type  Visited With Patient and family together  Visit Type Initial  Referral From Nurse  Consult/Referral To Chaplain   Chaplain responded to [facility] consult to provide support as he has recently been place on Palliative Care. Patient had just received breakfast, also under care from OT. Chaplain will follow up.

## 2022-09-23 NOTE — Progress Notes (Signed)
Progress Note   Patient: Matthew Norton ZOX:[id number] DOB: 16-Oct-1945 DOA: 09/20/2022     3 DOS: the patient was seen and examined on 09/23/2022   Brief hospital course: Matthew Norton is a 76 year old male with history of CAD status post 2 heart catheterization, hypertension, current tobacco use, hyperlipidemia, who presents emergency department for chief concerns of chest pain not relieved with home nitroglycerin. Troponin was more than 24,000, EKG showed ST elevation.  Emergent coronary angiogram was performed on 5/11, showed 100% stenosis in the ostial LAD to mid LAD, proximal 99% stenosis.  A drug-eluting stent was placed within the occluded LAD stent.  RCA stenosis was considered chronic.  Repeated angiogram was performed on 13, distal LAD still have 70% stenosis, proximal RCA still 99% stenosis.  But proximal LAD stenosis has resolved. Patient developed hypotension earlier this morning on 12/14.  Received IV fluid bolus.     Assessment and Plan:  Acute ST elevation myocardial infarction (STEMI) due to occlusion of left anterior descending (LAD) coronary artery (HCC) Hypotension. - Status post successful DES to proximal LAD - Patient also required 2 additional stents at prior areas of stent placement - Per interventional cardiologist, patient also was noted to have 99% stenosis at RCA however this appears chronic and does have collateral.   Repeated coronary angiogram 12/13 confirms patent stents. Patient is continued on aspirin, Plavix as well as Crestor.   Did not feel well this morning with low blood pressure.  Received 500 mm bolus of fluids.  Currently blood pressure much better, patient does not have any dizziness anymore.  Due to drop blood pressure, patient will be kept in the hospital for another day.  This is most likely due to blood pressure medicine as well as acute myocardial infarction. Echocardiogram reviewed showed ejection fraction 55 to 60%.   Insomnia As needed  melatonin 5 mg nightly pain for sleep ordered   Tobacco use disorder Continue nicotine patch.   Chronic venous insufficiency Patient would benefit from tobacco cessation however he is not ready to quit   Spinal stenosis in cervical region Chronic.   HTN (hypertension) - Losartan hold on admission per cardiology - Carvedilol 3.125 mg p.o. twice daily initiated      Subjective:  Patient did not feel well this morning with some associated tension.  He feels dizzy.  Otherwise does not have any chest pain shortness of breath  Physical Exam: Vitals:   09/23/22 0700 09/23/22 0831 09/23/22 0900 09/23/22 1200  BP: (!) 97/51 97/69 (!) 86/48 108/61  Pulse: 69  65 63  Resp: (!) '26  12 15  '$ Temp:    98.7 F (37.1 C)  TempSrc:      SpO2: 96%  98% 96%  Weight:      Height:       General exam: Appears calm and comfortable  Respiratory system: Clear to auscultation. Respiratory effort normal. Cardiovascular system: S1 & S2 heard, RRR. No JVD, murmurs, rubs, gallops or clicks. No pedal edema. Gastrointestinal system: Abdomen is nondistended, soft and nontender. No organomegaly or masses felt. Normal bowel sounds heard. Central nervous system: Alert and oriented. No focal neurological deficits. Extremities: Symmetric 5 x 5 power. Skin: No rashes, lesions or ulcers Psychiatry: Judgement and insight appear normal. Mood & affect appropriate.   Data Reviewed:  Lab results reviewed.  Family Communication: Brother updated at bedside  Disposition: Status is: Inpatient Remains inpatient appropriate because: Severity of disease.  Planned Discharge Destination: Home    Time  spent: 35 minutes  Author: Sharen Hones, MD 09/23/2022 1:53 PM  For on call review [email or web address].

## 2022-09-23 NOTE — Evaluation (Signed)
Physical Therapy Evaluation Patient Details Name: Matthew Norton MRN: [id number] DOB: [birth year] Today's Date: 09/23/2022  History of Present Illness  Pt is a 76 y.o. male presenting to hospital 09/20/22 with c/o chest pain not relieved with home nitroglycerin.  Pt admitted with acute STEMI d/t occlusion of L anterior descending (LAD) coronary artery, insomnia, SOB on exertion, chronic venous insufficiency, and spinal stenosis in cervical region.  S/p emergent cardiac cath R radial artery 09/20/22 (s/p PCI and stent placement).  Plan for staged procedure of RCA 09/22/22.  PMH includes CAD s/p 2 heart catheterization, htn, current tobacco use, HLD, anxiety, ACDF, back sx, and R clavicle sx.  Clinical Impression  Prior to hospital admission, pt was modified independent with functional mobility (used 4ww outside of home and used [facility] or held onto furniture as needed within home); lives alone in 1 level home with 1 STE; has assist from friend [name].  No c/o pain during session  Currently pt is SBA semi-supine to sitting edge of bed; CGA with transfers using RW; and CGA to ambulate 50 feet with RW use.  Pt would benefit from skilled PT to address noted impairments and functional limitations (see below for any additional details).  Upon hospital discharge, pt would benefit from [place] and continued support at home.    Recommendations for follow up therapy are one component of a multi-disciplinary discharge planning process, led by the attending physician.  Recommendations may be updated based on patient status, additional functional criteria and insurance authorization.  Follow Up Recommendations Home health PT      Assistance Recommended at Discharge Intermittent Supervision/Assistance  Patient can return home with the following  A little help with walking and/or transfers;A little help with bathing/dressing/bathroom;Assistance with cooking/housework;Assist for transportation;Help with stairs or ramp for  entrance    Equipment Recommendations Rolling walker (2 wheels);BSC/3in1  Recommendations for Other Services  OT consult    Functional Status Assessment Patient has had a recent decline in their functional status and demonstrates the ability to make significant improvements in function in a reasonable and predictable amount of time.     Precautions / Restrictions Precautions Precautions: Fall Precaution Comments: R radial precautions Restrictions Weight Bearing Restrictions: No      Mobility  Bed Mobility Overal bed mobility: Needs Assistance Bed Mobility: Supine to Sit     Supine to sit: Supervision, HOB elevated     General bed mobility comments: increased time/effort to perform on own; SBA for lines    Transfers Overall transfer level: Needs assistance Equipment used: Rolling walker (2 wheels) Transfers: Sit to/from Stand Sit to Stand: Min guard           General transfer comment: vc's for UE placement; x1 trial from bed and x1 trial from recliner    Ambulation/Gait Ambulation/Gait assistance: Min guard Gait Distance (Feet): 50 Feet Assistive device: Rolling walker (2 wheels)   Gait velocity: mildly decreased     General Gait Details: partial to step through gait pattern; steady with RW use; pt intermittently with flexed trunk posture (pt reports he normally does not stand up straight d/t h/o back surgery)  Stairs            Wheelchair Mobility    Modified Rankin (Stroke Patients Only)       Balance Overall balance assessment: Needs assistance Sitting-balance support: No upper extremity supported, Feet supported Sitting balance-Leahy Scale: Good Sitting balance - Comments: steady sitting reaching within BOS   Standing balance support: During functional activity,  Bilateral upper extremity supported, Single extremity supported Standing balance-Leahy Scale: Good Standing balance comment: steady ambulating with RW                              Pertinent Vitals/Pain Pain Assessment Pain Assessment: No/denies pain Vitals (HR and O2 on room air) stable and WFL throughout treatment session.    Home Living Family/patient expects to be discharged to:: Private residence Living Arrangements: Alone Available Help at Discharge: Family;Friend(s);Available PRN/intermittently Type of Home: House Home Access: Stairs to enter   [employer] of Steps: 1 small step to enter with B posts to hold onto   Home Layout: One level Home Equipment: Conservation officer, nature (2 wheels);Cane - quad;Cane - single point;Rollator (4 wheels);Shower seat      Prior Function Prior Level of Function : History of Falls (last six months);Independent/Modified Independent             Mobility Comments: Ambulatory with rollator outside (to get mail)/community; uses SPC or holds onto furniture walking around in home ADLs Comments: Takes sponge baths.  Caregiver (friend [name]) brings pt food, provides transportation to appts, and assists with groceries, cleaning, and paying bills.  Pt independent with medication management.     Hand Dominance   Dominant Hand: Right    Extremity/Trunk Assessment   Upper Extremity Assessment Upper Extremity Assessment: Generalized weakness    Lower Extremity Assessment Lower Extremity Assessment: Generalized weakness       Communication   Communication: No difficulties  Cognition Arousal/Alertness: Awake/alert Behavior During Therapy: WFL for tasks assessed/performed Overall Cognitive Status: Within Functional Limits for tasks assessed                                 General Comments: Pleasant and participatory.        General Comments  Nursing cleared pt for participation in physical therapy.  Pt agreeable to PT session.    Exercises  Transfer and gait training.   Assessment/Plan    PT Assessment Patient needs continued PT services  PT Problem List Decreased  strength;Decreased activity tolerance;Decreased balance;Decreased mobility       PT Treatment Interventions DME instruction;Gait training;Stair training;Functional mobility training;Therapeutic activities;Therapeutic exercise;Balance training;Patient/family education    PT Goals (Current goals can be found in the Care Plan section)  Acute Rehab PT Goals Patient Stated Goal: to improve strength and mobility PT Goal Formulation: With patient Time For Goal Achievement: 10/07/22 Potential to Achieve Goals: Good    Frequency Min 2X/week     Co-evaluation               AM-PAC PT "6 Clicks" Mobility  Outcome Measure Help needed turning from your back to your side while in a flat bed without using bedrails?: None Help needed moving from lying on your back to sitting on the side of a flat bed without using bedrails?: A Little Help needed moving to and from a bed to a chair (including a wheelchair)?: A Little Help needed standing up from a chair using your arms (e.g., wheelchair or bedside chair)?: A Little Help needed to walk in hospital room?: A Little Help needed climbing 3-5 steps with a railing? : A Little 6 Click Score: 19    End of Session Equipment Utilized During Treatment: Gait belt Activity Tolerance: Patient tolerated treatment well Patient left: in chair;with call bell/phone within reach;with chair alarm set;Other (  comment) (B heels floating via pillow support) Nurse Communication: Mobility status;Precautions;Other (comment) (pt's vitals during session) PT Visit Diagnosis: Other abnormalities of gait and mobility (R26.89);Muscle weakness (generalized) (M62.81)    Time: 0923-3007 PT Time Calculation (min) (ACUTE ONLY): 35 min   Charges:   PT Evaluation $PT Eval Low Complexity: 1 Low PT Treatments $Gait Training: 8-22 mins       Leitha Bleak, PT 09/23/22, 1:26 PM

## 2022-09-23 NOTE — Progress Notes (Signed)
[facility] Cardiology    SUBJECTIVE: Patient states he feels reasonably well some mild weakness no dizziness no chest pain   Vitals:   09/23/22 0600 09/23/22 0700 09/23/22 0831 09/23/22 0900  BP: 94/66 (!) 97/51 97/69 (!) 86/48  Pulse: 64 69  65  Resp: 15 (!) 26  12  Temp:      TempSrc:      SpO2: 95% 96%  98%  Weight:      Height:         Intake/Output Summary (Last 24 hours) at 09/23/2022 1046 Last data filed at 09/23/2022 0501 Gross per 24 hour  Intake 1111.58 ml  Output 1570 ml  Net -458.42 ml      PHYSICAL EXAM  General: Well developed, well nourished, in no acute distress HEENT:  Normocephalic and atramatic Neck:  No JVD.  Lungs: Clear bilaterally to auscultation and percussion. Heart: HRRR . Normal S1 and S2 without gallops or murmurs.  Abdomen: Bowel sounds are positive, abdomen soft and non-tender  Msk:  Back normal, normal gait. Normal strength and tone for age. Extremities: No clubbing, cyanosis or edema.   Neuro: Alert and oriented X 3. Psych:  Good affect, responds appropriately   LABS: Basic Metabolic Panel: Recent Labs    09/22/22 0312 09/23/22 0435  NA 136 139  K 3.4* 4.4  CL 106 110  CO2 24 24  GLUCOSE 110* 104*  BUN 13 16  CREATININE 1.06 1.14  CALCIUM 9.2 9.1  MG  --  2.0   Liver Function Tests: Recent Labs    09/20/22 1836 09/21/22 0240  AST 23 361*  ALT 15 89*  ALKPHOS 61 50  BILITOT 1.1 1.0  PROT 8.2* 6.7  ALBUMIN 4.4 3.7   No results for input(s): "LIPASE", "AMYLASE" in the last 72 hours. CBC: Recent Labs    09/20/22 1836 09/21/22 0240 09/22/22 0312 09/23/22 0435  WBC 10.8*   < > 6.3 5.3  NEUTROABS 8.7*  --   --   --   HGB 16.2   < > 14.1 13.3  HCT 47.5   < > 40.2 38.1*  MCV 95.6   < > 93.5 95.0  PLT 209   < > 144* 128*   < > = values in this interval not displayed.   Cardiac Enzymes: No results for input(s): "CKTOTAL", "CKMB", "CKMBINDEX", "TROPONINI" in the last 72 hours. BNP: Invalid input(s):  "POCBNP" D-Dimer: No results for input(s): "DDIMER" in the last 72 hours. Hemoglobin A1C: Recent Labs    09/20/22 1836  HGBA1C 5.6   Fasting Lipid Panel: Recent Labs    09/20/22 1836  CHOL 232*  HDL 32*  LDLCALC 157*  TRIG 217*  CHOLHDL 7.3   Thyroid Function Tests: No results for input(s): "TSH", "T4TOTAL", "T3FREE", "THYROIDAB" in the last 72 hours.  Invalid input(s): "FREET3" Anemia Panel: No results for input(s): "VITAMINB12", "FOLATE", "FERRITIN", "TIBC", "IRON", "RETICCTPCT" in the last 72 hours.  CARDIAC CATHETERIZATION  Addendum Date: 09/22/2022     Mid LM to Dist LM lesion is 50% stenosed.   Dist LAD lesion is 70% stenosed.   Mid Cx lesion is 50% stenosed.   Dist RCA lesion is 40% stenosed.   Prox RCA to Mid RCA lesion is 40% stenosed.   Prox RCA lesion is 99% stenosed.   Non-stenotic Ost LAD to Mid LAD lesion was previously treated. 1.  Patent stent ostial/proximal LAD 2.  Unsuccessful attempt to perform PCI of proximal RCA.  Proximal RCA stenosis likely chronic,  representing reconstituted vessel. Recommendations 1.  Dual antiplatelet therapy uninterrupted x 1 year 2.  Medical therapy of residual RCA stenosis 3.  Probable discharge home in a.m.  Result Date: 09/22/2022 1.  Patent stent ostial/proximal LAD 2.  Unsuccessful attempt to perform PCI of proximal RCA.  Proximal RCA stenosis likely chronic,  representing reconstituted vessel. Recommendations 1.  Dual antiplatelet therapy uninterrupted x 1 year 2.  Medical therapy of residual RCA stenosis 3.  Probable discharge home in a.m.   ECHOCARDIOGRAM COMPLETE  Result Date: 09/21/2022    ECHOCARDIOGRAM REPORT   Patient Name:   Matthew Norton Date of Exam: 09/21/2022 Medical Rec #:  [id number]    Height:       68.0 in Accession #:    [id number]   Weight:       188.3 lb Date of Birth:  1946/04/13   BSA:          1.992 m Patient Age:    76 years     BP:           110/69 mmHg Patient Gender: M            HR:           77 bpm.  Exam Location:  [facility] Procedure: 2D Echo, Color Doppler, Cardiac Doppler and Intracardiac            Opacification Agent Indications:     I21.9 Acute myocardial infarction, unspecified  History:         Patient has no prior history of Echocardiogram examinations.                  CAD, Signs/Symptoms:Chest Pain and Shortness of Breath; Risk                  Factors:Hypertension and Dyslipidemia.  Sonographer:     Charmayne Sheer Referring Phys:  [id number] AMY N COX Diagnosing Phys: Yolonda Kida MD IMPRESSIONS  1. Left ventricular ejection fraction, by estimation, is 55 to 60%. The left ventricle has normal function. The left ventricle has no regional wall motion abnormalities. There is mild left ventricular hypertrophy. Left ventricular diastolic parameters are consistent with Grade I diastolic dysfunction (impaired relaxation).  2. Right ventricular systolic function is normal. The right ventricular size is normal.  3. The mitral valve is normal in structure. Trivial mitral valve regurgitation.  4. The aortic valve is normal in structure. Aortic valve regurgitation is not visualized. FINDINGS  Left Ventricle: Left ventricular ejection fraction, by estimation, is 55 to 60%. The left ventricle has normal function. The left ventricle has no regional wall motion abnormalities. Definity contrast agent was given IV to delineate the left ventricular  endocardial borders. The left ventricular internal cavity size was normal in size. There is mild left ventricular hypertrophy. Left ventricular diastolic parameters are consistent with Grade I diastolic dysfunction (impaired relaxation). Right Ventricle: The right ventricular size is normal. No increase in right ventricular wall thickness. Right ventricular systolic function is normal. Left Atrium: Left atrial size was normal in size. Right Atrium: Right atrial size was normal in size. Pericardium: There is no evidence of pericardial effusion. Mitral Valve: The mitral valve is  normal in structure. Trivial mitral valve regurgitation. Tricuspid Valve: The tricuspid valve is normal in structure. Tricuspid valve regurgitation is trivial. Aortic Valve: The aortic valve is normal in structure. Aortic valve regurgitation is not visualized. Aortic valve mean gradient measures 3.0 mmHg. Aortic valve peak gradient measures 5.1 mmHg. Aortic  valve area, by VTI measures 2.72 cm. Pulmonic Valve: The pulmonic valve was normal in structure. Pulmonic valve regurgitation is not visualized. Aorta: The ascending aorta was not well visualized. IAS/Shunts: No atrial level shunt detected by color flow Doppler.  LEFT VENTRICLE PLAX 2D LVIDd:         4.70 cm   Diastology LVIDs:         3.30 cm   LV e' medial:    4.57 cm/s LV PW:         1.50 cm   LV E/e' medial:  8.8 LV IVS:        1.10 cm   LV e' lateral:   7.07 cm/s LVOT diam:     2.20 cm   LV E/e' lateral: 5.7 LV SV:         48 LV SV Index:   24 LVOT Area:     3.80 cm  RIGHT VENTRICLE RV Basal diam:  2.80 cm RV S prime:     15.20 cm/s TAPSE (M-mode): 2.2 cm LEFT ATRIUM             Index        RIGHT ATRIUM           Index LA diam:        2.90 cm 1.46 cm/m   RA Area:     12.10 cm LA Vol (A2C):   50.5 ml 25.35 ml/m  RA Volume:   25.10 ml  12.60 ml/m LA Vol (A4C):   34.1 ml 17.12 ml/m LA Biplane Vol: 41.5 ml 20.83 ml/m  AORTIC VALVE                    PULMONIC VALVE AV Area (Vmax):    2.60 cm     PV Vmax:          1.29 m/s AV Area (Vmean):   2.54 cm     PV Vmean:         86.700 cm/s AV Area (VTI):     2.72 cm     PV VTI:           0.183 m AV Vmax:           113.00 cm/s  PV Peak grad:     6.7 mmHg AV Vmean:          76.600 cm/s  PV Mean grad:     4.0 mmHg AV VTI:            0.175 m      PR End Diast Vel: 4.49 msec AV Peak Grad:      5.1 mmHg AV Mean Grad:      3.0 mmHg LVOT Vmax:         77.40 cm/s LVOT Vmean:        51.100 cm/s LVOT VTI:          0.125 m LVOT/AV VTI ratio: 0.71  AORTA Ao Root diam: 3.30 cm MITRAL VALVE MV Area (PHT): 6.02 cm    SHUNTS  MV Decel Time: 126 msec    Systemic VTI:  0.12 m MV E velocity: 40.40 cm/s  Systemic Diam: 2.20 cm MV A velocity: 71.30 cm/s MV E/A ratio:  0.57 Dominigue Gellner D Seanpatrick Maisano MD Electronically signed by Yolonda Kida MD Signature Date/Time: 09/21/2022/3:33:49 PM    Final      Echo preserved left ventricular function EF of 50 to 55%  TELEMETRY: Normal sinus rhythm rate of 70 nonspecific  changes:  ASSESSMENT AND PLAN:  Principal Problem:   Acute ST elevation myocardial infarction (STEMI) due to occlusion of left anterior descending (LAD) coronary artery (HCC) Active Problems:   CAD in native artery   HTN (hypertension)   HLD (hyperlipidemia)   Spinal stenosis in cervical region   Chronic venous insufficiency   Anxiety and depression   SOB (shortness of breath) on exertion   Tobacco use disorder   BPH with obstruction/lower urinary tract symptoms   Insomnia   Tobacco use   Thrombocytopenia (HCC)    Plan Status post STEMI PCI and stent to LAD with DES continue aspirin Plavix Recommend beta-blocker ACE ARB if patient blood pressure is able to tolerate Increase activity consider physical therapy occupational therapy Will discontinue Crestor patient states he is unable to tolerate statin will consider Repatha or Praluent Continue low-dose Coreg if able to tolerate    Yolonda Kida, MD 09/23/2022 10:46 AM

## 2022-09-24 DIAGNOSIS — I2102 ST elevation (STEMI) myocardial infarction involving left anterior descending coronary artery: Secondary | ICD-10-CM | POA: Diagnosis not present

## 2022-09-24 DIAGNOSIS — I9589 Other hypotension: Secondary | ICD-10-CM | POA: Diagnosis not present

## 2022-09-24 DIAGNOSIS — F17200 Nicotine dependence, unspecified, uncomplicated: Secondary | ICD-10-CM | POA: Diagnosis not present

## 2022-09-24 LAB — CBC
HCT: 36.7 % — ABNORMAL LOW (ref 39.0–52.0)
Hemoglobin: 12.5 g/dL — ABNORMAL LOW (ref 13.0–17.0)
MCH: 32.1 pg (ref 26.0–34.0)
MCHC: 34.1 g/dL (ref 30.0–36.0)
MCV: 94.3 fL (ref 80.0–100.0)
Platelets: 138 10*3/uL — ABNORMAL LOW (ref 150–400)
RBC: 3.89 MIL/uL — ABNORMAL LOW (ref 4.22–5.81)
RDW: 11.7 % (ref 11.5–15.5)
WBC: 5.5 10*3/uL (ref 4.0–10.5)
nRBC: 0 % (ref 0.0–0.2)

## 2022-09-24 LAB — BASIC METABOLIC PANEL
Anion gap: 6 (ref 5–15)
BUN: 19 mg/dL (ref 8–23)
CO2: 21 mmol/L — ABNORMAL LOW (ref 22–32)
Calcium: 8.7 mg/dL — ABNORMAL LOW (ref 8.9–10.3)
Chloride: 109 mmol/L (ref 98–111)
Creatinine, Ser: 1.04 mg/dL (ref 0.61–1.24)
GFR, Estimated: >60 mL/min (ref >60)
Glucose, Bld: 97 mg/dL (ref 70–99)
Potassium: 3.8 mmol/L (ref 3.5–5.1)
Sodium: 136 mmol/L (ref 135–145)

## 2022-09-24 LAB — MAGNESIUM: Magnesium: 2.1 mg/dL (ref 1.7–2.4)

## 2022-09-24 MED ORDER — NICOTINE 21 MG/24HR TD PT24: 21.0000 mg | MEDICATED_PATCH | Freq: Every day | TRANSDERMAL | 0 refills | Status: DC | PRN

## 2022-09-24 MED ORDER — CARVEDILOL 3.125 MG PO TABS: 3.1250 mg | ORAL_TABLET | Freq: Two times a day (BID) | ORAL | 0 refills | Status: DC

## 2022-09-24 MED ORDER — NICOTINE POLACRILEX 2 MG MT GUM: 2.0000 mg | CHEWING_GUM | OROMUCOSAL | 0 refills | Status: DC | PRN

## 2022-09-24 MED ORDER — CLOPIDOGREL BISULFATE 75 MG PO TABS: 75.0000 mg | ORAL_TABLET | Freq: Every day | ORAL | 0 refills | Status: DC

## 2022-09-24 NOTE — Progress Notes (Signed)
[facility] Cardiology    SUBJECTIVE: Patient states he feels reasonably well complains of mild weakness fatigue no chest pain no shortness of breath patient states he is unable to tolerate statin therapy and like to monitor negative patient lives alone and may need physical therapy and consideration for skilled nursing for rehab   Vitals:   09/23/22 1525 09/23/22 2004 09/23/22 2349 09/24/22 0435  BP: 113/61 105/62 109/64 100/62  Pulse: 70 70 73 69  Resp: '20 18 17 18  '$ Temp: 98.7 F (37.1 C) 98.6 F (37 C) 98.5 F (36.9 C) 98.6 F (37 C)  TempSrc: Oral  Oral   SpO2: 99% 97% 97% 98%  Weight:      Height:         Intake/Output Summary (Last 24 hours) at 09/24/2022 0746 Last data filed at 09/24/2022 0500 Gross per 24 hour  Intake 180 ml  Output 800 ml  Net -620 ml      PHYSICAL EXAM  General: Well developed, well nourished, in no acute distress HEENT:  Normocephalic and atramatic Neck:  No JVD.  Lungs: Clear bilaterally to auscultation and percussion. Heart: HRRR . Normal S1 and S2 without gallops or murmurs.  Abdomen: Bowel sounds are positive, abdomen soft and non-tender  Msk:  Back normal, normal gait. Normal strength and tone for age. Extremities: No clubbing, cyanosis or edema.   Neuro: Alert and oriented X 3. Psych:  Good affect, responds appropriately   LABS: Basic Metabolic Panel: Recent Labs    09/22/22 0312 09/23/22 0435  NA 136 139  K 3.4* 4.4  CL 106 110  CO2 24 24  GLUCOSE 110* 104*  BUN 13 16  CREATININE 1.06 1.14  CALCIUM 9.2 9.1  MG  --  2.0   Liver Function Tests: No results for input(s): "AST", "ALT", "ALKPHOS", "BILITOT", "PROT", "ALBUMIN" in the last 72 hours. No results for input(s): "LIPASE", "AMYLASE" in the last 72 hours. CBC: Recent Labs    09/22/22 0312 09/23/22 0435  WBC 6.3 5.3  HGB 14.1 13.3  HCT 40.2 38.1*  MCV 93.5 95.0  PLT 144* 128*   Cardiac Enzymes: No results for input(s): "CKTOTAL", "CKMB", "CKMBINDEX", "TROPONINI"  in the last 72 hours. BNP: Invalid input(s): "POCBNP" D-Dimer: No results for input(s): "DDIMER" in the last 72 hours. Hemoglobin A1C: No results for input(s): "HGBA1C" in the last 72 hours. Fasting Lipid Panel: No results for input(s): "CHOL", "HDL", "LDLCALC", "TRIG", "CHOLHDL", "LDLDIRECT" in the last 72 hours. Thyroid Function Tests: No results for input(s): "TSH", "T4TOTAL", "T3FREE", "THYROIDAB" in the last 72 hours.  Invalid input(s): "FREET3" Anemia Panel: No results for input(s): "VITAMINB12", "FOLATE", "FERRITIN", "TIBC", "IRON", "RETICCTPCT" in the last 72 hours.  CARDIAC CATHETERIZATION  Addendum Date: 09/22/2022     Mid LM to Dist LM lesion is 50% stenosed.   Dist LAD lesion is 70% stenosed.   Mid Cx lesion is 50% stenosed.   Dist RCA lesion is 40% stenosed.   Prox RCA to Mid RCA lesion is 40% stenosed.   Prox RCA lesion is 99% stenosed.   Non-stenotic Ost LAD to Mid LAD lesion was previously treated. 1.  Patent stent ostial/proximal LAD 2.  Unsuccessful attempt to perform PCI of proximal RCA.  Proximal RCA stenosis likely chronic,  representing reconstituted vessel. Recommendations 1.  Dual antiplatelet therapy uninterrupted x 1 year 2.  Medical therapy of residual RCA stenosis 3.  Probable discharge home in a.m.  Result Date: 09/22/2022 1.  Patent stent ostial/proximal LAD 2.  Unsuccessful attempt to perform PCI of proximal RCA.  Proximal RCA stenosis likely chronic,  representing reconstituted vessel. Recommendations 1.  Dual antiplatelet therapy uninterrupted x 1 year 2.  Medical therapy of residual RCA stenosis 3.  Probable discharge home in a.m.     Echo preserved left ventricular function 50 to 55%  TELEMETRY: Normal sinus rhythm: Rate of 65  ASSESSMENT AND PLAN:  Principal Problem:   Acute ST elevation myocardial infarction (STEMI) due to occlusion of left anterior descending (LAD) coronary artery (HCC) Active Problems:   CAD in native artery   HTN  (hypertension)   HLD (hyperlipidemia)   Spinal stenosis in cervical region   Chronic venous insufficiency   Anxiety and depression   SOB (shortness of breath) on exertion   Tobacco use disorder   BPH with obstruction/lower urinary tract symptoms   Insomnia   Tobacco use   Thrombocytopenia (HCC)   Hypotension    Plan Status post STEMI PCI and stent to LAD DES stable no further chest pain Unsuccessful attempt with PCI and stent to CTO RCA continue conservative medical therapy Patient stable enough for discharge from a cardiac standpoint Continue aspirin Plavix for 12 months uninterrupted Initiate beta-blocker or ACE or ARB if blood pressure tolerates Patient are not able to tolerate statin therapy will prescribe Repatha purulent as an outpatient Increase activity ambulate in the halls consider skilled nursing for rehab Advised patient refrain from tobacco abuse Patient still having trouble with insomnia consider over-the-counter medications melatonin Follow-up with cardiology 2 to 4 weeks with Dr. San Morelle, MD 09/24/2022 7:46 AM

## 2022-09-24 NOTE — TOC Initial Note (Addendum)
Transition of Care [facility]) - Initial/Assessment Note    Patient Details  Name: Matthew Norton MRN: [id number] Date of Birth: December 26, 1945  [facility] CM/SW Contact:    Tiburcio Bash, LCSW Phone Number: 09/24/2022, 10:55 AM  Clinical Narrative:                  CSW met with patient at bedside, anticipated dc home today with recommendations by PT for Home Health. Patient reports he's in agreement, no preference of agency.   Patient reports having a cane and rollator at home, no dme needs.   PCP Dr. Damita Dunnings  Patient reports [name] will pick him up today and to call her, CSW has called and lvm with [name].   [place] referrals sent out for [place] PT, Malachy Mood with [facility] able to accept for [facility] RN and PT services    Expected Discharge Plan: [place] Barriers to Discharge: No Barriers Identified   Patient Goals and CMS Choice Patient states their goals for this hospitalization and ongoing recovery are:: to go home CMS Medicare.gov Compare Post Acute Care list provided to:: Patient Choice offered to / list presented to : Patient  Expected Discharge Plan and Services Expected Discharge Plan: [place]   Discharge Planning Services: CM Consult Post Acute Care Choice: [place] Living arrangements for the past 2 months: Single Family Home Expected Discharge Date: 09/24/22               DME Arranged: N/A DME Agency: NA       HH Arranged: PT [place] Agency: NA Date HH Agency Contacted: 09/24/22      Prior Living Arrangements/Services Living arrangements for the past 2 months: [place] Lives with:: Self Patient language and need for interpreter reviewed:: Yes Do you feel safe going back to the place where you live?: Yes      Need for Family Participation in Patient Care: Yes (Comment) Care giver support system in place?: Yes (comment)   Criminal Activity/Legal Involvement Pertinent to Current  Situation/Hospitalization: No - Comment as needed  Activities of Daily Living Home Assistive Devices/Equipment: None ADL Screening (condition at time of admission) Patient's cognitive ability adequate to safely complete daily activities?: Yes Is the patient deaf or have difficulty hearing?: Yes Does the patient have difficulty seeing, even when wearing glasses/contacts?: No Does the patient have difficulty concentrating, remembering, or making decisions?: No Patient able to express need for assistance with ADLs?: Yes Does the patient have difficulty dressing or bathing?: Yes Independently performs ADLs?: No Communication: Independent Dressing (OT): Needs assistance Is this a change from baseline?: Change from baseline, expected to last >3 days Grooming: Needs assistance Is this a change from baseline?: Pre-admission baseline Feeding: Independent Bathing: Needs assistance Is this a change from baseline?: Pre-admission baseline Toileting: Needs assistance Is this a change from baseline?: Pre-admission baseline In/Out Bed: Independent Walks in Home: Independent Does the patient have difficulty walking or climbing stairs?: Yes Weakness of Legs: None Weakness of Arms/Hands: Both  Permission Sought/Granted Permission sought to share information with : Family Supports Permission granted to share information with : Yes, Verbal Permission Granted  Share Information with NAME: [place] granted to share info w Relationship: friend/ caregiver  Permission granted to share info w Contact Information: [phone]  Emotional Assessment Appearance:: Appears stated age Attitude/Demeanor/Rapport: Engaged Affect (typically observed): Accepting Orientation: : Oriented to Self, Oriented to Place, Oriented to  Time, Oriented to Situation Alcohol / Substance Use: Not Applicable Psych Involvement: No (comment)  Admission diagnosis:  ST elevation myocardial infarction (STEMI),  unspecified artery (HCC) [I21.3] Acute ST elevation myocardial infarction (STEMI) due to occlusion of left anterior descending (LAD) coronary artery (HCC) [I21.02] Patient Active Problem List   Diagnosis Date Noted   Thrombocytopenia ([place]) 09/23/2022   Hypotension 09/23/2022   Tobacco use 09/21/2022   Acute ST elevation myocardial infarction (STEMI) due to occlusion of left anterior descending (LAD) coronary artery ([place]) 09/20/2022   Rash 07/18/2022   Knee pain 02/21/2022   Anxiety 02/19/2022   Statin myopathy 05/01/2020   Insomnia 07/02/2019   Pain in both knees 12/10/2018   B12 deficiency 09/26/2018   Healthcare maintenance 08/06/2018   BPH with obstruction/lower urinary tract symptoms 08/06/2018   Shoulder pain 08/06/2018   Arthralgia 08/23/2017   Tobacco use disorder 08/23/2017   FH: colon cancer 04/30/2016   FH: prostate cancer 04/30/2016   Advance care planning 04/30/2016   Allergic rhinitis 03/15/2016   HNP (herniated nucleus pulposus) with myelopathy, cervical 03/24/2015   SOB (shortness of breath) on exertion 11/11/2014   MI, acute, non ST segment elevation ([place]) 11/08/2014   CAD in native artery 10/27/2014   HTN (hypertension) 10/27/2014   HLD (hyperlipidemia) 10/27/2014   Spinal stenosis in cervical region 10/27/2014   Chronic venous insufficiency 10/27/2014   Anxiety and depression 10/27/2014   Swelling, mass, or lump in head and neck 10/27/2014   PCP:  Tonia Ghent, MD Pharmacy:   [facility] [address], [place] - [place] [address] [place] [address] Phone: [phone] Fax: [phone]     Social Determinants of Health (SDOH) Interventions    Readmission Risk Interventions     No data to display

## 2022-09-24 NOTE — Discharge Summary (Signed)
Physician Discharge Summary   Patient: Matthew Norton MRN: [id number] DOB: 1945-12-05  Admit date:     09/20/2022  Discharge date: 09/24/22  Discharge Physician: Sharen Hones   PCP: Tonia Ghent, MD   Recommendations at discharge:   Follow-up with PCP in 1 week. Follow-up with cardiology as scheduled.  Discharge Diagnoses: Principal Problem:   Acute ST elevation myocardial infarction (STEMI) due to occlusion of left anterior descending (LAD) coronary artery (HCC) Active Problems:   CAD in native artery   HTN (hypertension)   HLD (hyperlipidemia)   Spinal stenosis in cervical region   Chronic venous insufficiency   Anxiety and depression   SOB (shortness of breath) on exertion   Tobacco use disorder   BPH with obstruction/lower urinary tract symptoms   Insomnia   Tobacco use   Thrombocytopenia (HCC)   Hypotension  Resolved Problems:   * No resolved hospital problems. [facility] Course: Matthew Norton is a 76 year old male with history of CAD status post 2 heart catheterization, hypertension, current tobacco use, hyperlipidemia, who presents emergency department for chief concerns of chest pain not relieved with home nitroglycerin. Troponin was more than 24,000, EKG showed ST elevation.  Emergent coronary angiogram was performed on 5/11, showed 100% stenosis in the ostial LAD to mid LAD, proximal 99% stenosis.  A drug-eluting stent was placed within the occluded LAD stent.  RCA stenosis was considered chronic.  Repeated angiogram was performed on 13, distal LAD still have 70% stenosis, proximal RCA still 99% stenosis.  But proximal LAD stenosis has resolved. Patient developed hypotension earlier this morning on 12/14.  Received IV fluid bolus.     Assessment and Plan:  Acute ST elevation myocardial infarction (STEMI) due to occlusion of left anterior descending (LAD) coronary artery (HCC) Hypotension. - Status post successful DES to proximal LAD - Patient also required  2 additional stents at prior areas of stent placement - Per interventional cardiologist, patient also was noted to have 99% stenosis at RCA however this appears chronic and does have collateral.   Repeated coronary angiogram 12/13 confirms patent stents. Echocardiogram reviewed showed ejection fraction 55 to 60%. Patient had a transient hypotension yesterday, resolved after giving fluids.  Currently he feels better, medically stable to be discharged.  Will continue aspirin, Plavix, as well as beta-blocker. Patient is adamant that he will not taking any statin, he was not able to tolerate it due to nightmares.  Insomnia Improved.   Tobacco use disorder Patient is not certain that he can quit smoking, smoking cessation is strongly urged.  I have prescribed nicotine patch as well as chewing tobacco to help him quit.   Chronic venous insufficiency Patient would benefit from tobacco cessation however he is not ready to quit   Spinal stenosis in cervical region Chronic.   HTN (hypertension) Continue Coreg, may restart losartan at next office visit with cardiology.  He had an episode of hypotension, will continue to hold losartan.       Consultants: Cardiology Procedures performed: Heart cath  Disposition: Home health Diet recommendation:  Discharge Diet Orders (From admission, onward)     Start     Ordered   09/24/22 0000  Diet - low sodium heart healthy        09/24/22 1026           Cardiac diet DISCHARGE MEDICATION: Allergies as of 09/24/2022       Reactions   Methocarbamol Shortness Of Breath, Palpitations   Bupropion Other (See  Comments)   Hydrochlorothiazide Other (See Comments)   Unspecified   Paroxetine Hcl Other (See Comments)   Unspecified   Tramadol Hcl Other (See Comments)   Nightmares    Venlafaxine Other (See Comments)   Unspecified   Flexeril [cyclobenzaprine] Other (See Comments)   Likely sedation reaction   Gabapentin Other (See Comments)    Headache,weakness   Hydroxyzine Other (See Comments)   Lack of effect for anxiety.    Lipitor [atorvastatin] Other (See Comments)   aches   Pravastatin Other (See Comments)   nightmares        Medication List     STOP taking these medications    losartan 25 MG tablet Commonly known as: COZAAR       TAKE these medications    acetaminophen 500 MG tablet Commonly known as: TYLENOL Take 2 tablets (1,000 mg total) by mouth every 8 (eight) hours as needed (for pain).   aspirin 81 MG tablet Take 81 mg by mouth daily.   carvedilol 3.125 MG tablet Commonly known as: COREG Take 1 tablet (3.125 mg total) by mouth 2 (two) times daily with a meal.   clopidogrel 75 MG tablet Commonly known as: PLAVIX Take 1 tablet (75 mg total) by mouth daily. Start taking on: September 25, 2022   cyanocobalamin 1000 MCG/ML injection Commonly known as: VITAMIN B12 1000 mcg IM every 14 days   diazepam 5 MG tablet Commonly known as: VALIUM TAKE 1/2 TO 1 (ONE-HALF TO ONE) TABLET BY MOUTH ONCE DAILY IN THE MORNING AND 1 TO 2 TABLETS BY MOUTH NIGHTLY-MAX OF 3 TABLETS PER DAY   fluticasone 50 MCG/ACT nasal spray Commonly known as: FLONASE Place 2 sprays into both nostrils daily. For allergies   loratadine 10 MG tablet Commonly known as: CLARITIN Take 10 mg by mouth daily.   nicotine 21 mg/24hr patch Commonly known as: NICODERM CQ - dosed in mg/24 hours Place 1 patch (21 mg total) onto the skin daily as needed (nicotine craving).   nicotine polacrilex 2 MG gum Commonly known as: NICORETTE Take 1 each (2 mg total) by mouth as needed for smoking cessation.   nitroGLYCERIN 0.4 MG SL tablet Commonly known as: NITROSTAT Place 1 tablet (0.4 mg total) under the tongue every 5 (five) minutes as needed for chest pain. Max 3 doses in 15 minutes.   OVER THE COUNTER MEDICATION Beta Prostate, take one by mouth daily        Follow-up Information     Paraschos, Alexander, MD. Go in 1 week(s).    Specialty: Cardiology Contact information: [facility] [place] [address] [phone]         Tonia Ghent, MD Follow up in 1 week(s).   Specialty: Family Medicine Contact information: [place] [address] [phone]                Discharge Exam: Danley Danker Weights   09/20/22 1833 09/20/22 2035  Weight: 86.2 kg 85.4 kg   General exam: Appears calm and comfortable  Respiratory system: Clear to auscultation. Respiratory effort normal. Cardiovascular system: S1 & S2 heard, RRR. No JVD, murmurs, rubs, gallops or clicks. No pedal edema. Gastrointestinal system: Abdomen is nondistended, soft and nontender. No organomegaly or masses felt. Normal bowel sounds heard. Central nervous system: Alert and oriented. No focal neurological deficits. Extremities: Symmetric 5 x 5 power. Skin: No rashes, lesions or ulcers Psychiatry: Judgement and insight appear normal. Mood & affect appropriate.  Condition at discharge: good  The results of significant diagnostics from this hospitalization (including imaging, microbiology, ancillary and laboratory) are listed below for reference.   Imaging Studies: CARDIAC CATHETERIZATION  Addendum Date: 09/22/2022     Mid LM to Dist LM lesion is 50% stenosed.   Dist LAD lesion is 70% stenosed.   Mid Cx lesion is 50% stenosed.   Dist RCA lesion is 40% stenosed.   Prox RCA to Mid RCA lesion is 40% stenosed.   Prox RCA lesion is 99% stenosed.   Non-stenotic Ost LAD to Mid LAD lesion was previously treated. 1.  Patent stent ostial/proximal LAD 2.  Unsuccessful attempt to perform PCI of proximal RCA.  Proximal RCA stenosis likely chronic,  representing reconstituted vessel. Recommendations 1.  Dual antiplatelet therapy uninterrupted x 1 year 2.  Medical therapy of residual RCA stenosis 3.  Probable discharge home in a.m.  Result Date: 09/22/2022 1.  Patent stent ostial/proximal LAD  2.  Unsuccessful attempt to perform PCI of proximal RCA.  Proximal RCA stenosis likely chronic,  representing reconstituted vessel. Recommendations 1.  Dual antiplatelet therapy uninterrupted x 1 year 2.  Medical therapy of residual RCA stenosis 3.  Probable discharge home in a.m.   ECHOCARDIOGRAM COMPLETE  Result Date: 09/21/2022    ECHOCARDIOGRAM REPORT   Patient Name:   Matthew Norton Date of Exam: 09/21/2022 Medical Rec #:  [id number]    Height:       68.0 in Accession #:    [id number]   Weight:       188.3 lb Date of Birth:  [birth year]   BSA:          1.992 m Patient Age:    10 years     BP:           110/69 mmHg Patient Gender: M            HR:           77 bpm. Exam Location:  [facility] Procedure: 2D Echo, Color Doppler, Cardiac Doppler and Intracardiac            Opacification Agent Indications:     I21.9 Acute myocardial infarction, unspecified  History:         Patient has no prior history of Echocardiogram examinations.                  CAD, Signs/Symptoms:Chest Pain and Shortness of Breath; Risk                  Factors:Hypertension and Dyslipidemia.  Sonographer:     Charmayne Sheer Referring Phys:  [id number] AMY N COX Diagnosing Phys: Yolonda Kida MD IMPRESSIONS  1. Left ventricular ejection fraction, by estimation, is 55 to 60%. The left ventricle has normal function. The left ventricle has no regional wall motion abnormalities. There is mild left ventricular hypertrophy. Left ventricular diastolic parameters are consistent with Grade I diastolic dysfunction (impaired relaxation).  2. Right ventricular systolic function is normal. The right ventricular size is normal.  3. The mitral valve is normal in structure. Trivial mitral valve regurgitation.  4. The aortic valve is normal in structure. Aortic valve regurgitation is not visualized. FINDINGS  Left Ventricle: Left ventricular ejection fraction, by estimation, is 55 to 60%. The left ventricle has normal function. The left ventricle has no regional wall  motion abnormalities. Definity contrast agent was given IV to delineate the left ventricular  endocardial borders. The left ventricular internal cavity size was normal  in size. There is mild left ventricular hypertrophy. Left ventricular diastolic parameters are consistent with Grade I diastolic dysfunction (impaired relaxation). Right Ventricle: The right ventricular size is normal. No increase in right ventricular wall thickness. Right ventricular systolic function is normal. Left Atrium: Left atrial size was normal in size. Right Atrium: Right atrial size was normal in size. Pericardium: There is no evidence of pericardial effusion. Mitral Valve: The mitral valve is normal in structure. Trivial mitral valve regurgitation. Tricuspid Valve: The tricuspid valve is normal in structure. Tricuspid valve regurgitation is trivial. Aortic Valve: The aortic valve is normal in structure. Aortic valve regurgitation is not visualized. Aortic valve mean gradient measures 3.0 mmHg. Aortic valve peak gradient measures 5.1 mmHg. Aortic valve area, by VTI measures 2.72 cm. Pulmonic Valve: The pulmonic valve was normal in structure. Pulmonic valve regurgitation is not visualized. Aorta: The ascending aorta was not well visualized. IAS/Shunts: No atrial level shunt detected by color flow Doppler.  LEFT VENTRICLE PLAX 2D LVIDd:         4.70 cm   Diastology LVIDs:         3.30 cm   LV e' medial:    4.57 cm/s LV PW:         1.50 cm   LV E/e' medial:  8.8 LV IVS:        1.10 cm   LV e' lateral:   7.07 cm/s LVOT diam:     2.20 cm   LV E/e' lateral: 5.7 LV SV:         48 LV SV Index:   24 LVOT Area:     3.80 cm  RIGHT VENTRICLE RV Basal diam:  2.80 cm RV S prime:     15.20 cm/s TAPSE (M-mode): 2.2 cm LEFT ATRIUM             Index        RIGHT ATRIUM           Index LA diam:        2.90 cm 1.46 cm/m   RA Area:     12.10 cm LA Vol (A2C):   50.5 ml 25.35 ml/m  RA Volume:   25.10 ml  12.60 ml/m LA Vol (A4C):   34.1 ml 17.12 ml/m LA  Biplane Vol: 41.5 ml 20.83 ml/m  AORTIC VALVE                    PULMONIC VALVE AV Area (Vmax):    2.60 cm     PV Vmax:          1.29 m/s AV Area (Vmean):   2.54 cm     PV Vmean:         86.700 cm/s AV Area (VTI):     2.72 cm     PV VTI:           0.183 m AV Vmax:           113.00 cm/s  PV Peak grad:     6.7 mmHg AV Vmean:          76.600 cm/s  PV Mean grad:     4.0 mmHg AV VTI:            0.175 m      PR End Diast Vel: 4.49 msec AV Peak Grad:      5.1 mmHg AV Mean Grad:      3.0 mmHg LVOT Vmax:  77.40 cm/s LVOT Vmean:        51.100 cm/s LVOT VTI:          0.125 m LVOT/AV VTI ratio: 0.71  AORTA Ao Root diam: 3.30 cm MITRAL VALVE MV Area (PHT): 6.02 cm    SHUNTS MV Decel Time: 126 msec    Systemic VTI:  0.12 m MV E velocity: 40.40 cm/s  Systemic Diam: 2.20 cm MV A velocity: 71.30 cm/s MV E/A ratio:  0.57 Yolonda Kida MD Electronically signed by Yolonda Kida MD Signature Date/Time: 09/21/2022/3:33:49 PM    Final    CARDIAC CATHETERIZATION  Result Date: 09/20/2022   Mid Cx lesion is 50% stenosed.   Ost LAD to Mid LAD lesion is 100% stenosed.   Mid LM to Dist LM lesion is 50% stenosed.   Dist LAD lesion is 70% stenosed.   Prox RCA lesion is 99% stenosed.   Prox RCA to Mid RCA lesion is 40% stenosed.   Dist RCA lesion is 40% stenosed.   A drug-eluting stent was successfully placed using a STENT ONYX FRONTIER 3.5X26.   Post intervention, there is a 0% residual stenosis.   There is moderate left ventricular systolic dysfunction.   The left ventricular ejection fraction is 25-35% by visual estimate. 1.  Anterior ST elevation myocardial infarction due to an occluded proximal LAD stent (stent thrombosis in addition to significant in-stent restenosis).  There is also significant stenosis extending distal to the stent.  There is moderate distal left main stenosis and moderate left circumflex disease.  The proximal RCA severely diseased but appears to be chronic with some right to right and  left-to-right collaterals. 2.  Moderately reduced LV systolic function with severe mid to distal anterior and apical hypokinesis. 3.  Moderately elevated left ventricular end-diastolic pressure at 28 mmHg. 4.  Successful angioplasty and 2 overlapped drug-eluting stent placement to the proximal LAD extending beyond the previously placed stent and all the way back to the ostium. Recommendations: Dual antiplatelet therapy for at least 12 months and preferably intermittently. Aggrastat infusion for 12 hours. Staged RCA PCI in 2 days before hospital discharge. History of intolerance to atorvastatin and pravastatin.  Will try small dose rosuvastatin. [facility] cardiology to follow   DG Chest Port 1 View  Result Date: 09/20/2022 CLINICAL DATA:  Chest pain beginning at 1400 hours after walking to the mailbox, history coronary artery disease post MI x2 and coronary stenting x3 EXAM: PORTABLE CHEST 1 VIEW COMPARISON:  Portable exam 1845 hours compared to 07/05/2019 FINDINGS: External pacing leads project over chest. Normal heart size, mediastinal contours, and pulmonary vascularity. Chronic accentuation of basilar markings stable. Lungs otherwise clear. No pulmonary infiltrate, pleural effusion, or pneumothorax. Prior cervical spine fusion. IMPRESSION: No acute abnormalities. Electronically Signed   By: Lavonia Dana M.D.   On: 09/20/2022 19:12    Microbiology: Results for orders placed or performed during the hospital encounter of 09/20/22  MRSA Next Gen by PCR, Nasal     Status: None   Collection Time: 09/20/22  8:38 PM   Specimen: Nasal Mucosa; Nasal Swab  Result Value Ref Range Status   MRSA by PCR Next Gen NOT DETECTED NOT DETECTED Final    Comment: (NOTE) The GeneXpert MRSA Assay (FDA approved for NASAL specimens only), is one component of a comprehensive MRSA colonization surveillance program. It is not intended to diagnose MRSA infection nor to guide or monitor treatment for MRSA infections. Test performance  is not FDA approved in patients less  than 54 years old. Performed at [facility], [address] [place], [place] [address]     Labs: CBC: Recent Labs  Lab 09/20/22 1836 09/21/22 0240 09/22/22 0312 09/23/22 0435 09/24/22 0637  WBC 10.8* 8.0 6.3 5.3 5.5  NEUTROABS 8.7*  --   --   --   --   HGB 16.2 14.4 14.1 13.3 12.5*  HCT 47.5 41.5 40.2 38.1* 36.7*  MCV 95.6 93.9 93.5 95.0 94.3  PLT 209 170 144* 128* 631*   Basic Metabolic Panel: Recent Labs  Lab 09/20/22 1836 09/21/22 0240 09/22/22 0312 09/23/22 0435 09/24/22 0637  NA 138 136 136 139 136  K 4.5 4.5 3.4* 4.4 3.8  CL 105 108 106 110 109  CO2 '24 23 24 24 '$ 21*  GLUCOSE 131* 106* 110* 104* 97  BUN '10 12 13 16 19  '$ CREATININE 1.20 1.02 1.06 1.14 1.04  CALCIUM 10.0 9.1 9.2 9.1 8.7*  MG  --   --   --  2.0 2.1   Liver Function Tests: Recent Labs  Lab 09/20/22 1836 09/21/22 0240  AST 23 361*  ALT 15 89*  ALKPHOS 61 50  BILITOT 1.1 1.0  PROT 8.2* 6.7  ALBUMIN 4.4 3.7   CBG: Recent Labs  Lab 09/20/22 2045  GLUCAP 98    Discharge time spent: greater than 30 minutes.  Signed: Sharen Hones, MD [facility] 09/24/2022

## 2022-09-24 NOTE — Plan of Care (Signed)
  Problem: Education: Goal: Knowledge of General Education information will improve Description: Including pain rating scale, medication(s)/side effects and non-pharmacologic comfort measures Outcome: Progressing   Problem: Health Behavior/Discharge Planning: Goal: Ability to manage health-related needs will improve Outcome: Progressing   Problem: Clinical Measurements: Goal: Ability to maintain clinical measurements within normal limits will improve Outcome: Progressing Goal: Will remain free from infection Outcome: Progressing Goal: Diagnostic test results will improve Outcome: Progressing Goal: Respiratory complications will improve Outcome: Progressing Goal: Cardiovascular complication will be avoided Outcome: Progressing   Problem: Activity: Goal: Risk for activity intolerance will decrease Outcome: Progressing   Problem: Nutrition: Goal: Adequate nutrition will be maintained Outcome: Progressing   Problem: Coping: Goal: Level of anxiety will decrease Outcome: Progressing   Problem: Elimination: Goal: Will not experience complications related to bowel motility Outcome: Progressing Goal: Will not experience complications related to urinary retention Outcome: Progressing   Problem: Pain Managment: Goal: General experience of comfort will improve Outcome: Progressing   Problem: Safety: Goal: Ability to remain free from injury will improve Outcome: Progressing   Problem: Skin Integrity: Goal: Risk for impaired skin integrity will decrease Outcome: Progressing   Problem: Education: Goal: Understanding of CV disease, CV risk reduction, and recovery process will improve Outcome: Progressing Goal: Individualized Educational Video(s) Outcome: Progressing   Problem: Activity: Goal: Ability to return to baseline activity level will improve Outcome: Progressing   Problem: Cardiovascular: Goal: Ability to achieve and maintain adequate cardiovascular perfusion  will improve Outcome: Progressing Goal: Vascular access site(s) Level 0-1 will be maintained Outcome: Progressing   Problem: Health Behavior/Discharge Planning: Goal: Ability to safely manage health-related needs after discharge will improve Outcome: Progressing   Problem: Education: Goal: Knowledge of cardiac device and self-care will improve Outcome: Progressing Goal: Ability to safely manage health related needs after discharge will improve Outcome: Progressing Goal: Individualized Educational Video(s) Outcome: Progressing   Problem: Cardiac: Goal: Ability to achieve and maintain adequate cardiopulmonary perfusion will improve Outcome: Progressing

## 2022-09-27 ENCOUNTER — Telehealth: Payer: Self-pay | Admitting: *Deleted

## 2022-09-27 ENCOUNTER — Telehealth: Payer: Self-pay

## 2022-09-27 ENCOUNTER — Encounter: Payer: Self-pay | Admitting: *Deleted

## 2022-09-27 NOTE — Patient Outreach (Signed)
Care Coordination TOC Note Transition Care Management Follow-up Telephone Call Date of discharge and from where: Friday 09/24/22; [place] Acute STEMI with PCI How have you been since you were released from the hospital? "I am doing okay and I slept great last night, better than I have in a long time.  I still feel weak, but I feel like I am getting stronger every day.  I am able to do everything to care for myself on my own.  [name] helps me out with taking me to doctor appointments mainly.  I don't understand why the doctors at the hospital changed my blood pressure medicine and I don't like taking this new blood pressure medicine because it has to be taken with food.  I will ask my heart doctor about it when I see him on Wednesday this week" Any questions or concerns? Yes- does not like taking new blood pressure medication (carvedilol) because he has to take it with food; encouraged to  take hospital discharge papers to scheduled cardiology appointment on Wednesday 09/29/22 and to ask cardiologist to evaluate current medications  Items Reviewed: Did the pt receive and understand the discharge instructions provided? Yes  Medications obtained and verified? Yes  Other? No  Any new allergies since your discharge? No  Dietary orders reviewed? Yes Do you have support at home? Yes  patient lives alone but his friend [name] assists with ADL's as indicated/ needed; patient reports he is independent in self-care  Home Care and Equipment/Supplies: Were home health services ordered? yes If so, what is the name of the agency? [facility]- RN and PT  Has the agency set up a time to come to the patient's home? No- encouraged patient to listen today for phone call from home health agency; he declines taking my direct phone number for assistance if needed in the future Were any new equipment or medical supplies ordered?  No What is the name of the medical supply agency? N/A Were you able to get the  supplies/equipment? not applicable Do you have any questions related to the use of the equipment or supplies? No N/A  Functional Questionnaire: (I = Independent and D = Dependent) ADLs: I  Bathing/Dressing- I  Meal Prep- I  friend [name] assists with ADL's as indicated  Eating- I  Maintaining continence- I  Transferring/Ambulation- I  Managing Meds- I  Follow up appointments reviewed:  PCP Hospital f/u appt confirmed? No  Scheduled to see - on - @ [facility] f/u appt confirmed? Yes  Scheduled to see Wednesday 09/29/22 @ 10:00 am Are transportation arrangements needed? No  If their condition worsens, is the pt aware to call PCP or go to the Emergency Dept.? Yes Was the patient provided with contact information for the PCP's office or ED? No- patient declines, reports has contact information for all care providers Was to pt encouraged to call back with questions or concerns? Yes- declines taking my direct contact information; encouraged to call PCP office if concerns identified  SDOH assessments and interventions completed:   Yes SDOH Interventions Today    Flowsheet Row Most Recent Value  SDOH Interventions   Food Insecurity Interventions Intervention Not Indicated  Transportation Interventions Intervention Not Indicated  [friend [name] provides transportation]      Care Coordination Interventions:  Reviewed new medications instructions with patient and encouraged him to discuss his concerns with new medicines with cardiology provider during office visit this week    Encounter Outcome:  Pt. Visit Completed  Oneta Rack, RN, BSN, CCRN Alumnus RN CM Care Coordination/ Transition of [place] Management [phone]: direct office

## 2022-09-27 NOTE — Progress Notes (Addendum)
Chronic Care Management Pharmacy Assistant   Name: Matthew Norton  MRN: [id number] DOB: 07-Jun-1946  Reason for Encounter: Non-CCM Matthew Norton Follow-Up)  Medications: Outpatient Encounter Medications as of 09/27/2022  Medication Sig   acetaminophen (TYLENOL) 500 MG tablet Take 2 tablets (1,000 mg total) by mouth every 8 (eight) hours as needed (for pain).   aspirin 81 MG tablet Take 81 mg by mouth daily.   carvedilol (COREG) 3.125 MG tablet Take 1 tablet (3.125 mg total) by mouth 2 (two) times daily with a meal.   clopidogrel (PLAVIX) 75 MG tablet Take 1 tablet (75 mg total) by mouth daily.   cyanocobalamin (,VITAMIN B-12,) 1000 MCG/ML injection 1000 mcg IM every 14 days   diazepam (VALIUM) 5 MG tablet TAKE 1/2 TO 1 (ONE-HALF TO ONE) TABLET BY MOUTH ONCE DAILY IN THE MORNING AND 1 TO 2 TABLETS BY MOUTH NIGHTLY-MAX OF 3 TABLETS PER DAY   fluticasone (FLONASE) 50 MCG/ACT nasal spray Place 2 sprays into both nostrils daily. For allergies   loratadine (CLARITIN) 10 MG tablet Take 10 mg by mouth daily.   nicotine (NICODERM CQ - DOSED IN MG/24 HOURS) 21 mg/24hr patch Place 1 patch (21 mg total) onto the skin daily as needed (nicotine craving).   nicotine polacrilex (NICORETTE) 2 MG gum Take 1 each (2 mg total) by mouth as needed for smoking cessation.   nitroGLYCERIN (NITROSTAT) 0.4 MG SL tablet Place 1 tablet (0.4 mg total) under the tongue every 5 (five) minutes as needed for chest pain. Max 3 doses in 15 minutes.   OVER THE COUNTER MEDICATION Beta Prostate, take one by mouth daily   No facility-administered encounter medications on file as of 09/27/2022.     Reviewed Norton notes for details of recent visit. Has patient been contacted by Transitions of Care team? No Has patient seen PCP/specialist for Norton follow up (summarize OV if yes): No  Admitted to the Norton on 09/20/2022. Discharge date was 09/24/2022.  Discharged from Matthew Norton.   Discharge diagnosis  (Principal Problem): ST elevation myocardial infarction (STEMI)  Patient was discharged to Home with Home Health  Brief summary of Norton course: Matthew Norton is a 76 year old male with known history of coronary artery disease status post remote MI with stent placement to the LAD and 2 stent placement to the right coronary artery.  This was more than 10 years ago.  He has known history of active tobacco use, essential hypertension and hyperlipidemia with reported intolerance to atorvastatin and pravastatin. He has not seen Dr. Saralyn Norton since October 2021.  He reports having intermittent exertional chest pain over the last 6 months that resolved with rest.  He started using nitroglycerin recently and the chest pain was always responsive to nitroglycerin.  However, he went to his mailbox this evening and started having severe substernal chest pain that did not resolve with 3 sublingual nitroglycerin and thus he called 911.  An EKG was performed and showed evidence of anterior ST elevation.  A code STEMI was activated.  His EKG improved in the ED but he was still having significant pain.  The patient was given 5000 units of unfractionated heparin and 180 mg of Brilinta orally.  I recommended proceeding with emergent cardiac catheterization.  Acute ST elevation myocardial infarction (STEMI) due to occlusion of left anterior descending (LAD) coronary artery (HCC) Hypotension. - Status post successful DES to proximal LAD - Patient also required 2 additional stents at prior areas of stent placement - Per interventional  cardiologist, patient also was noted to have 99% stenosis at RCA however this appears chronic and does have collateral.   Repeated coronary angiogram 12/13 confirms patent stents. Echocardiogram reviewed showed ejection fraction 55 to 60%. Patient had a transient hypotension yesterday, resolved after giving fluids.  Currently he feels better, medically stable to be discharged.  Will continue  aspirin, Plavix, as well as beta-blocker. Patient is adamant that he will not taking any statin, he was not able to tolerate it due to nightmares.  Insomnia Improved.   Tobacco use disorder Patient is not certain that he can quit smoking, smoking cessation is strongly urged.  I have prescribed nicotine patch as well as chewing tobacco to help him quit.   Chronic venous insufficiency Patient would benefit from tobacco cessation however he is not ready to quit   Spinal stenosis in cervical region Chronic.   HTN (hypertension) Continue Coreg, may restart losartan at next office visit with cardiology.  He had an episode of hypotension, will continue to hold losartan.   New?Medications Started at Matthew Norton Discharge:?? -Started carvedilol (COREG) 3.125 MG tablet  -Started clopidogrel (PLAVIX) 75 MG tablet -Started nicotine (NICODERM CQ - DOSED IN MG/24 HOURS) 21 mg/24hr patch -Started nicotine polacrilex (NICORETTE) 2 MG gum   Medication Changes at Norton Discharge: None noted  Medications Discontinued at Norton Discharge: -Stopped losartan 25 MG tablet   Medications that remain the same after Norton Discharge:??  -All other medications will remain the same.    Next CCM appt: Non-CCM  Other upcoming appts: No appointments scheduled within the next 30 days.  Matthew Norton, PharmD notified and will determine if action is needed.   Pharmacist addendum: Reviewed chart. Pt is a good candidate for PharmD services given cardiac history and medication challenges, team will reach out in new year to discuss with patient. He has seen cardiology 12/20 who changed carvedilol to metoprolol succinate and recommended trial of atorvastatin 40 mg.   Matthew Norton, PharmD, Matthew Norton 10/08/22 11:27 AM

## 2022-09-29 DIAGNOSIS — E538 Deficiency of other specified B group vitamins: Secondary | ICD-10-CM | POA: Diagnosis not present

## 2022-09-29 DIAGNOSIS — M4802 Spinal stenosis, cervical region: Secondary | ICD-10-CM | POA: Diagnosis not present

## 2022-09-29 DIAGNOSIS — E7849 Other hyperlipidemia: Secondary | ICD-10-CM | POA: Diagnosis not present

## 2022-09-29 DIAGNOSIS — F17210 Nicotine dependence, cigarettes, uncomplicated: Secondary | ICD-10-CM | POA: Diagnosis not present

## 2022-09-29 DIAGNOSIS — F32A Depression, unspecified: Secondary | ICD-10-CM | POA: Diagnosis not present

## 2022-09-29 DIAGNOSIS — D696 Thrombocytopenia, unspecified: Secondary | ICD-10-CM | POA: Diagnosis not present

## 2022-09-29 DIAGNOSIS — I2102 ST elevation (STEMI) myocardial infarction involving left anterior descending coronary artery: Secondary | ICD-10-CM | POA: Diagnosis not present

## 2022-09-29 DIAGNOSIS — F17200 Nicotine dependence, unspecified, uncomplicated: Secondary | ICD-10-CM | POA: Diagnosis not present

## 2022-09-29 DIAGNOSIS — Z955 Presence of coronary angioplasty implant and graft: Secondary | ICD-10-CM | POA: Diagnosis not present

## 2022-09-29 DIAGNOSIS — E785 Hyperlipidemia, unspecified: Secondary | ICD-10-CM | POA: Diagnosis not present

## 2022-09-29 DIAGNOSIS — I872 Venous insufficiency (chronic) (peripheral): Secondary | ICD-10-CM | POA: Diagnosis not present

## 2022-09-29 DIAGNOSIS — Z7902 Long term (current) use of antithrombotics/antiplatelets: Secondary | ICD-10-CM | POA: Diagnosis not present

## 2022-09-29 DIAGNOSIS — G4700 Insomnia, unspecified: Secondary | ICD-10-CM | POA: Diagnosis not present

## 2022-09-29 DIAGNOSIS — I10 Essential (primary) hypertension: Secondary | ICD-10-CM | POA: Diagnosis not present

## 2022-09-29 DIAGNOSIS — Z48812 Encounter for surgical aftercare following surgery on the circulatory system: Secondary | ICD-10-CM | POA: Diagnosis not present

## 2022-09-29 DIAGNOSIS — Z23 Encounter for immunization: Secondary | ICD-10-CM | POA: Diagnosis not present

## 2022-09-29 DIAGNOSIS — I2510 Atherosclerotic heart disease of native coronary artery without angina pectoris: Secondary | ICD-10-CM | POA: Diagnosis not present

## 2022-09-29 DIAGNOSIS — R600 Localized edema: Secondary | ICD-10-CM | POA: Diagnosis not present

## 2022-10-05 ENCOUNTER — Telehealth: Payer: Self-pay | Admitting: Family Medicine

## 2022-10-05 DIAGNOSIS — I10 Essential (primary) hypertension: Secondary | ICD-10-CM | POA: Diagnosis not present

## 2022-10-05 DIAGNOSIS — M4802 Spinal stenosis, cervical region: Secondary | ICD-10-CM | POA: Diagnosis not present

## 2022-10-05 DIAGNOSIS — E785 Hyperlipidemia, unspecified: Secondary | ICD-10-CM | POA: Diagnosis not present

## 2022-10-05 DIAGNOSIS — I872 Venous insufficiency (chronic) (peripheral): Secondary | ICD-10-CM | POA: Diagnosis not present

## 2022-10-05 DIAGNOSIS — F32A Depression, unspecified: Secondary | ICD-10-CM | POA: Diagnosis not present

## 2022-10-05 DIAGNOSIS — G4700 Insomnia, unspecified: Secondary | ICD-10-CM | POA: Diagnosis not present

## 2022-10-05 DIAGNOSIS — I2510 Atherosclerotic heart disease of native coronary artery without angina pectoris: Secondary | ICD-10-CM | POA: Diagnosis not present

## 2022-10-05 DIAGNOSIS — F17210 Nicotine dependence, cigarettes, uncomplicated: Secondary | ICD-10-CM | POA: Diagnosis not present

## 2022-10-05 DIAGNOSIS — Z48812 Encounter for surgical aftercare following surgery on the circulatory system: Secondary | ICD-10-CM | POA: Diagnosis not present

## 2022-10-05 DIAGNOSIS — I2102 ST elevation (STEMI) myocardial infarction involving left anterior descending coronary artery: Secondary | ICD-10-CM | POA: Diagnosis not present

## 2022-10-05 DIAGNOSIS — Z955 Presence of coronary angioplasty implant and graft: Secondary | ICD-10-CM | POA: Diagnosis not present

## 2022-10-05 DIAGNOSIS — D696 Thrombocytopenia, unspecified: Secondary | ICD-10-CM | POA: Diagnosis not present

## 2022-10-05 DIAGNOSIS — Z7902 Long term (current) use of antithrombotics/antiplatelets: Secondary | ICD-10-CM | POA: Diagnosis not present

## 2022-10-05 DIAGNOSIS — E538 Deficiency of other specified B group vitamins: Secondary | ICD-10-CM | POA: Diagnosis not present

## 2022-10-05 NOTE — Telephone Encounter (Signed)
Home Health verbal orders [place] Name: Matthew Norton number: [phone]  Requesting OT/PT/Skilled nursing/Social Work/Speech: community service/social work  Reason:helps with food and heating in his home  Frequency:  Please forward to [employer] pool or providers CMA

## 2022-10-07 DIAGNOSIS — F32A Depression, unspecified: Secondary | ICD-10-CM | POA: Diagnosis not present

## 2022-10-07 DIAGNOSIS — Z955 Presence of coronary angioplasty implant and graft: Secondary | ICD-10-CM | POA: Diagnosis not present

## 2022-10-07 DIAGNOSIS — E785 Hyperlipidemia, unspecified: Secondary | ICD-10-CM | POA: Diagnosis not present

## 2022-10-07 DIAGNOSIS — I10 Essential (primary) hypertension: Secondary | ICD-10-CM | POA: Diagnosis not present

## 2022-10-07 DIAGNOSIS — D696 Thrombocytopenia, unspecified: Secondary | ICD-10-CM | POA: Diagnosis not present

## 2022-10-07 DIAGNOSIS — I2102 ST elevation (STEMI) myocardial infarction involving left anterior descending coronary artery: Secondary | ICD-10-CM | POA: Diagnosis not present

## 2022-10-07 DIAGNOSIS — G4700 Insomnia, unspecified: Secondary | ICD-10-CM | POA: Diagnosis not present

## 2022-10-07 DIAGNOSIS — F17210 Nicotine dependence, cigarettes, uncomplicated: Secondary | ICD-10-CM | POA: Diagnosis not present

## 2022-10-07 DIAGNOSIS — Z48812 Encounter for surgical aftercare following surgery on the circulatory system: Secondary | ICD-10-CM | POA: Diagnosis not present

## 2022-10-07 DIAGNOSIS — Z7902 Long term (current) use of antithrombotics/antiplatelets: Secondary | ICD-10-CM | POA: Diagnosis not present

## 2022-10-07 DIAGNOSIS — I2510 Atherosclerotic heart disease of native coronary artery without angina pectoris: Secondary | ICD-10-CM | POA: Diagnosis not present

## 2022-10-07 DIAGNOSIS — M4802 Spinal stenosis, cervical region: Secondary | ICD-10-CM | POA: Diagnosis not present

## 2022-10-07 DIAGNOSIS — E538 Deficiency of other specified B group vitamins: Secondary | ICD-10-CM | POA: Diagnosis not present

## 2022-10-07 DIAGNOSIS — I872 Venous insufficiency (chronic) (peripheral): Secondary | ICD-10-CM | POA: Diagnosis not present

## 2022-10-07 NOTE — Telephone Encounter (Signed)
Matthew Norton has been given the requested verbal orders

## 2022-10-07 NOTE — Telephone Encounter (Signed)
Please give the order.  Thanks.   

## 2022-10-08 DIAGNOSIS — I2102 ST elevation (STEMI) myocardial infarction involving left anterior descending coronary artery: Secondary | ICD-10-CM | POA: Diagnosis not present

## 2022-10-08 DIAGNOSIS — M4802 Spinal stenosis, cervical region: Secondary | ICD-10-CM | POA: Diagnosis not present

## 2022-10-08 DIAGNOSIS — Z7902 Long term (current) use of antithrombotics/antiplatelets: Secondary | ICD-10-CM | POA: Diagnosis not present

## 2022-10-08 DIAGNOSIS — Z955 Presence of coronary angioplasty implant and graft: Secondary | ICD-10-CM | POA: Diagnosis not present

## 2022-10-08 DIAGNOSIS — Z48812 Encounter for surgical aftercare following surgery on the circulatory system: Secondary | ICD-10-CM | POA: Diagnosis not present

## 2022-10-08 DIAGNOSIS — F32A Depression, unspecified: Secondary | ICD-10-CM | POA: Diagnosis not present

## 2022-10-08 DIAGNOSIS — I2510 Atherosclerotic heart disease of native coronary artery without angina pectoris: Secondary | ICD-10-CM | POA: Diagnosis not present

## 2022-10-08 DIAGNOSIS — G4700 Insomnia, unspecified: Secondary | ICD-10-CM | POA: Diagnosis not present

## 2022-10-08 DIAGNOSIS — F17210 Nicotine dependence, cigarettes, uncomplicated: Secondary | ICD-10-CM | POA: Diagnosis not present

## 2022-10-08 DIAGNOSIS — I10 Essential (primary) hypertension: Secondary | ICD-10-CM | POA: Diagnosis not present

## 2022-10-08 DIAGNOSIS — E538 Deficiency of other specified B group vitamins: Secondary | ICD-10-CM | POA: Diagnosis not present

## 2022-10-08 DIAGNOSIS — I872 Venous insufficiency (chronic) (peripheral): Secondary | ICD-10-CM | POA: Diagnosis not present

## 2022-10-08 DIAGNOSIS — E785 Hyperlipidemia, unspecified: Secondary | ICD-10-CM | POA: Diagnosis not present

## 2022-10-08 DIAGNOSIS — D696 Thrombocytopenia, unspecified: Secondary | ICD-10-CM | POA: Diagnosis not present

## 2022-10-14 DIAGNOSIS — I10 Essential (primary) hypertension: Secondary | ICD-10-CM | POA: Diagnosis not present

## 2022-10-14 DIAGNOSIS — Z7902 Long term (current) use of antithrombotics/antiplatelets: Secondary | ICD-10-CM | POA: Diagnosis not present

## 2022-10-14 DIAGNOSIS — Z48812 Encounter for surgical aftercare following surgery on the circulatory system: Secondary | ICD-10-CM | POA: Diagnosis not present

## 2022-10-14 DIAGNOSIS — M4802 Spinal stenosis, cervical region: Secondary | ICD-10-CM | POA: Diagnosis not present

## 2022-10-14 DIAGNOSIS — E538 Deficiency of other specified B group vitamins: Secondary | ICD-10-CM | POA: Diagnosis not present

## 2022-10-14 DIAGNOSIS — F17210 Nicotine dependence, cigarettes, uncomplicated: Secondary | ICD-10-CM | POA: Diagnosis not present

## 2022-10-14 DIAGNOSIS — Z955 Presence of coronary angioplasty implant and graft: Secondary | ICD-10-CM | POA: Diagnosis not present

## 2022-10-14 DIAGNOSIS — D696 Thrombocytopenia, unspecified: Secondary | ICD-10-CM | POA: Diagnosis not present

## 2022-10-14 DIAGNOSIS — I872 Venous insufficiency (chronic) (peripheral): Secondary | ICD-10-CM | POA: Diagnosis not present

## 2022-10-14 DIAGNOSIS — I2102 ST elevation (STEMI) myocardial infarction involving left anterior descending coronary artery: Secondary | ICD-10-CM | POA: Diagnosis not present

## 2022-10-14 DIAGNOSIS — E785 Hyperlipidemia, unspecified: Secondary | ICD-10-CM | POA: Diagnosis not present

## 2022-10-14 DIAGNOSIS — F32A Depression, unspecified: Secondary | ICD-10-CM | POA: Diagnosis not present

## 2022-10-14 DIAGNOSIS — G4700 Insomnia, unspecified: Secondary | ICD-10-CM | POA: Diagnosis not present

## 2022-10-14 DIAGNOSIS — I2510 Atherosclerotic heart disease of native coronary artery without angina pectoris: Secondary | ICD-10-CM | POA: Diagnosis not present

## 2022-10-20 ENCOUNTER — Telehealth: Payer: Self-pay | Admitting: Family Medicine

## 2022-10-20 DIAGNOSIS — E538 Deficiency of other specified B group vitamins: Secondary | ICD-10-CM | POA: Diagnosis not present

## 2022-10-20 DIAGNOSIS — F32A Depression, unspecified: Secondary | ICD-10-CM | POA: Diagnosis not present

## 2022-10-20 DIAGNOSIS — G4700 Insomnia, unspecified: Secondary | ICD-10-CM | POA: Diagnosis not present

## 2022-10-20 DIAGNOSIS — E785 Hyperlipidemia, unspecified: Secondary | ICD-10-CM | POA: Diagnosis not present

## 2022-10-20 DIAGNOSIS — D696 Thrombocytopenia, unspecified: Secondary | ICD-10-CM | POA: Diagnosis not present

## 2022-10-20 DIAGNOSIS — Z48812 Encounter for surgical aftercare following surgery on the circulatory system: Secondary | ICD-10-CM | POA: Diagnosis not present

## 2022-10-20 DIAGNOSIS — I2102 ST elevation (STEMI) myocardial infarction involving left anterior descending coronary artery: Secondary | ICD-10-CM | POA: Diagnosis not present

## 2022-10-20 DIAGNOSIS — Z7902 Long term (current) use of antithrombotics/antiplatelets: Secondary | ICD-10-CM | POA: Diagnosis not present

## 2022-10-20 DIAGNOSIS — M4802 Spinal stenosis, cervical region: Secondary | ICD-10-CM | POA: Diagnosis not present

## 2022-10-20 DIAGNOSIS — I872 Venous insufficiency (chronic) (peripheral): Secondary | ICD-10-CM | POA: Diagnosis not present

## 2022-10-20 DIAGNOSIS — Z955 Presence of coronary angioplasty implant and graft: Secondary | ICD-10-CM | POA: Diagnosis not present

## 2022-10-20 DIAGNOSIS — I10 Essential (primary) hypertension: Secondary | ICD-10-CM | POA: Diagnosis not present

## 2022-10-20 DIAGNOSIS — I2510 Atherosclerotic heart disease of native coronary artery without angina pectoris: Secondary | ICD-10-CM | POA: Diagnosis not present

## 2022-10-20 DIAGNOSIS — F17210 Nicotine dependence, cigarettes, uncomplicated: Secondary | ICD-10-CM | POA: Diagnosis not present

## 2022-10-20 MED ORDER — CLOPIDOGREL BISULFATE 75 MG PO TABS: 75.0000 mg | ORAL_TABLET | Freq: Every day | ORAL | 3 refills | Status: DC

## 2022-10-20 NOTE — Telephone Encounter (Signed)
Rx was given at the hospital; okay to refill?

## 2022-10-20 NOTE — Telephone Encounter (Signed)
Prescription Request  10/20/2022  Is this a "Controlled Substance" medicine? No  LOV: 07/16/2022  What is the name of the medication or equipment? clopidogrel (PLAVIX) 75 MG tablet   Have you contacted your pharmacy to request a refill? No   Which pharmacy would you like this sent to?  [place], [place] - [address] [place] [address] Phone: [phone] Fax: [phone]    Patient notified that their request is being sent to the clinical staff for review and that they should receive a response within 2 business days.   Please advise at Mobile There is no such number on file (mobile).

## 2022-10-20 NOTE — Telephone Encounter (Signed)
Sent. Thanks.   

## 2022-10-20 NOTE — Telephone Encounter (Signed)
Home Health verbal orders [place] Name: Fonnie Mu number: [phone]  Requesting OT/PT/Skilled nursing/Social Work/Speech:  Reason:Social work  Frequency:pt is requesting meals on wheels  Please forward to [employer] or providers CMA

## 2022-10-20 NOTE — Addendum Note (Signed)
Addended by: Tonia Ghent on: 10/20/2022 03:45 PM   Modules accepted: Orders

## 2022-10-20 NOTE — Telephone Encounter (Signed)
Please give the order.  Thanks.   

## 2022-10-21 NOTE — Telephone Encounter (Signed)
Tried to call but number just rings and rings and no vm picks up. Will try again later

## 2022-10-22 NOTE — Telephone Encounter (Signed)
Called [facility] health and spoke with clinical manager Demetria. Gave approval of verbal orders.

## 2022-10-26 ENCOUNTER — Other Ambulatory Visit: Payer: Self-pay | Admitting: Family Medicine

## 2022-10-26 NOTE — Telephone Encounter (Signed)
Refill request for diazePAM 5 MG Oral Tablet   LOV - 07/16/22 Next OV - not scheduled Last refill - 07/27/22 #90/0

## 2022-10-27 NOTE — Telephone Encounter (Signed)
Sent. Thanks.   

## 2022-11-03 ENCOUNTER — Ambulatory Visit: Payer: Medicare Other

## 2022-11-03 DIAGNOSIS — I2510 Atherosclerotic heart disease of native coronary artery without angina pectoris: Secondary | ICD-10-CM | POA: Diagnosis not present

## 2022-11-03 DIAGNOSIS — D696 Thrombocytopenia, unspecified: Secondary | ICD-10-CM | POA: Diagnosis not present

## 2022-11-03 DIAGNOSIS — I872 Venous insufficiency (chronic) (peripheral): Secondary | ICD-10-CM | POA: Diagnosis not present

## 2022-11-03 DIAGNOSIS — Z955 Presence of coronary angioplasty implant and graft: Secondary | ICD-10-CM | POA: Diagnosis not present

## 2022-11-03 DIAGNOSIS — Z48812 Encounter for surgical aftercare following surgery on the circulatory system: Secondary | ICD-10-CM | POA: Diagnosis not present

## 2022-11-03 DIAGNOSIS — E538 Deficiency of other specified B group vitamins: Secondary | ICD-10-CM | POA: Diagnosis not present

## 2022-11-03 DIAGNOSIS — Z7902 Long term (current) use of antithrombotics/antiplatelets: Secondary | ICD-10-CM | POA: Diagnosis not present

## 2022-11-03 DIAGNOSIS — I10 Essential (primary) hypertension: Secondary | ICD-10-CM | POA: Diagnosis not present

## 2022-11-03 DIAGNOSIS — F32A Depression, unspecified: Secondary | ICD-10-CM | POA: Diagnosis not present

## 2022-11-03 DIAGNOSIS — G4700 Insomnia, unspecified: Secondary | ICD-10-CM | POA: Diagnosis not present

## 2022-11-03 DIAGNOSIS — F17210 Nicotine dependence, cigarettes, uncomplicated: Secondary | ICD-10-CM | POA: Diagnosis not present

## 2022-11-03 DIAGNOSIS — M4802 Spinal stenosis, cervical region: Secondary | ICD-10-CM | POA: Diagnosis not present

## 2022-11-03 DIAGNOSIS — I2102 ST elevation (STEMI) myocardial infarction involving left anterior descending coronary artery: Secondary | ICD-10-CM | POA: Diagnosis not present

## 2022-11-03 DIAGNOSIS — E785 Hyperlipidemia, unspecified: Secondary | ICD-10-CM | POA: Diagnosis not present

## 2022-11-23 ENCOUNTER — Ambulatory Visit: Payer: Medicare Other

## 2022-11-23 DIAGNOSIS — M4802 Spinal stenosis, cervical region: Secondary | ICD-10-CM | POA: Diagnosis not present

## 2022-11-23 DIAGNOSIS — Z955 Presence of coronary angioplasty implant and graft: Secondary | ICD-10-CM | POA: Diagnosis not present

## 2022-11-23 DIAGNOSIS — F32A Depression, unspecified: Secondary | ICD-10-CM | POA: Diagnosis not present

## 2022-11-23 DIAGNOSIS — D696 Thrombocytopenia, unspecified: Secondary | ICD-10-CM | POA: Diagnosis not present

## 2022-11-23 DIAGNOSIS — G4700 Insomnia, unspecified: Secondary | ICD-10-CM | POA: Diagnosis not present

## 2022-11-23 DIAGNOSIS — I872 Venous insufficiency (chronic) (peripheral): Secondary | ICD-10-CM | POA: Diagnosis not present

## 2022-11-23 DIAGNOSIS — I2510 Atherosclerotic heart disease of native coronary artery without angina pectoris: Secondary | ICD-10-CM | POA: Diagnosis not present

## 2022-11-23 DIAGNOSIS — Z48812 Encounter for surgical aftercare following surgery on the circulatory system: Secondary | ICD-10-CM | POA: Diagnosis not present

## 2022-11-23 DIAGNOSIS — I10 Essential (primary) hypertension: Secondary | ICD-10-CM | POA: Diagnosis not present

## 2022-11-23 DIAGNOSIS — F17210 Nicotine dependence, cigarettes, uncomplicated: Secondary | ICD-10-CM | POA: Diagnosis not present

## 2022-11-23 DIAGNOSIS — Z7902 Long term (current) use of antithrombotics/antiplatelets: Secondary | ICD-10-CM | POA: Diagnosis not present

## 2022-11-23 DIAGNOSIS — I2102 ST elevation (STEMI) myocardial infarction involving left anterior descending coronary artery: Secondary | ICD-10-CM | POA: Diagnosis not present

## 2022-11-23 DIAGNOSIS — E538 Deficiency of other specified B group vitamins: Secondary | ICD-10-CM | POA: Diagnosis not present

## 2022-11-23 DIAGNOSIS — E785 Hyperlipidemia, unspecified: Secondary | ICD-10-CM | POA: Diagnosis not present

## 2023-01-03 DIAGNOSIS — I872 Venous insufficiency (chronic) (peripheral): Secondary | ICD-10-CM | POA: Diagnosis not present

## 2023-01-03 DIAGNOSIS — Z23 Encounter for immunization: Secondary | ICD-10-CM | POA: Diagnosis not present

## 2023-01-03 DIAGNOSIS — E7849 Other hyperlipidemia: Secondary | ICD-10-CM | POA: Diagnosis not present

## 2023-01-03 DIAGNOSIS — R0602 Shortness of breath: Secondary | ICD-10-CM | POA: Diagnosis not present

## 2023-01-03 DIAGNOSIS — I2510 Atherosclerotic heart disease of native coronary artery without angina pectoris: Secondary | ICD-10-CM | POA: Diagnosis not present

## 2023-01-03 DIAGNOSIS — F17200 Nicotine dependence, unspecified, uncomplicated: Secondary | ICD-10-CM | POA: Diagnosis not present

## 2023-01-03 DIAGNOSIS — I2102 ST elevation (STEMI) myocardial infarction involving left anterior descending coronary artery: Secondary | ICD-10-CM | POA: Diagnosis not present

## 2023-01-03 DIAGNOSIS — I10 Essential (primary) hypertension: Secondary | ICD-10-CM | POA: Diagnosis not present

## 2023-01-17 ENCOUNTER — Other Ambulatory Visit: Payer: Self-pay | Admitting: Family Medicine

## 2023-01-18 NOTE — Telephone Encounter (Signed)
Refill request for  diazePAM 5 MG Oral Tablet   LOV - 07/16/22 Next OV - not scheduled Last refill - 10/27/22 #90/0

## 2023-03-04 ENCOUNTER — Ambulatory Visit: Payer: 59 | Admitting: Family Medicine

## 2023-03-08 ENCOUNTER — Ambulatory Visit: Payer: 59 | Admitting: Family Medicine

## 2023-03-09 ENCOUNTER — Encounter: Payer: Self-pay | Admitting: Family Medicine

## 2023-03-09 ENCOUNTER — Ambulatory Visit (INDEPENDENT_AMBULATORY_CARE_PROVIDER_SITE_OTHER): Payer: 59 | Admitting: Family Medicine

## 2023-03-09 VITALS — BP 122/68 | HR 58 | Temp 97.2°F | Ht 68.0 in | Wt 178.0 lb

## 2023-03-09 DIAGNOSIS — F17200 Nicotine dependence, unspecified, uncomplicated: Secondary | ICD-10-CM | POA: Diagnosis not present

## 2023-03-09 DIAGNOSIS — E538 Deficiency of other specified B group vitamins: Secondary | ICD-10-CM

## 2023-03-09 DIAGNOSIS — M4802 Spinal stenosis, cervical region: Secondary | ICD-10-CM | POA: Diagnosis not present

## 2023-03-09 MED ORDER — CYANOCOBALAMIN 1000 MCG/ML IJ SOLN
1000.0000 ug | Freq: Once | INTRAMUSCULAR | Status: AC
  Administered 2023-03-09: 1000 ug via INTRAMUSCULAR

## 2023-03-09 MED ORDER — PREDNISONE 20 MG PO TABS: ORAL_TABLET | ORAL | 0 refills | Status: DC

## 2023-03-09 NOTE — Assessment & Plan Note (Signed)
Encourage cessation. °

## 2023-03-09 NOTE — Progress Notes (Signed)
Neck and shoulder pain.  Prev imaging d/w pt.  Injections didn't help.  Didn't tolerate gabapentin.  Taking tylenol at baseline, with transient help.  Pain can radiate to the B arms.  Burning pain.  No new focal weakness.  No recent B12 injections.  D/w pt about restart.  Rationale for use discussed with patient.  Smoking cessation encouraged.  D/w pt.  3/4 PPD.  Discussed cardiology follow-up.  No chest pain.  Prev MR with IMPRESSION: 1. No acute intracranial abnormality. 2. Multifocal chronic ischemic white matter changes. 3. 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 4. 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 5. Unchanged left C6-7 neural foraminal stenosis.  Meds, vitals, and allergies reviewed.   ROS: Per HPI unless specifically indicated in ROS section   Nad Ncat Neck supple, no LA Rrr Ctab Abd soft, not ttp normal grip and sensation B hands.

## 2023-03-09 NOTE — Patient Instructions (Addendum)
Cardiology appointment 04/05/2023 ------ 10:30 AM   Please schedule B12 shots every 2 weeks.   Take prednisone 2 a day for 5 days, then 1 a day for 5 days, with food. Don't take with aleve/ibuprofen.  Let me know if that is helping or not in about 1 week.   Take care.  Glad to see you.

## 2023-03-09 NOTE — Assessment & Plan Note (Signed)
Discussed options.  Steroid cautions discussed with patient.  Reasonable to take prednisone 2 a day for 5 days, then 1 a day for 5 days, with food. Don't take with aleve/ibuprofen.  He can let me know if that is helping or not in about 1 week.

## 2023-03-09 NOTE — Assessment & Plan Note (Addendum)
Discussed scheduling B12 shots every 2 weeks. Dose done today at office visit.

## 2023-03-22 ENCOUNTER — Telehealth: Payer: Self-pay | Admitting: Family Medicine

## 2023-03-22 MED ORDER — PREGABALIN 25 MG PO CAPS: 25.0000 mg | ORAL_CAPSULE | Freq: Two times a day (BID) | ORAL | 0 refills | Status: DC

## 2023-03-22 NOTE — Telephone Encounter (Signed)
I would try taking lyrica 25mg  twice a day.  If he has swelling on the med or sedation, then stop and let us know.  Would see if that hleps the burning pain.  Thanks.

## 2023-03-22 NOTE — Addendum Note (Signed)
Addended by: Joaquim Nam on: 03/22/2023 01:33 PM   Modules accepted: Orders

## 2023-03-22 NOTE — Telephone Encounter (Signed)
Patient called in and stated that he was prescribed some predniSONE (DELTASONE) 20 MG tablet. He stated that it helped the pain in his neck but didn't help with the burning sensation in his right shoulder. He stated that he also needs some medicine for his arthritis. Thank  you!

## 2023-03-23 ENCOUNTER — Ambulatory Visit: Payer: 59

## 2023-03-23 NOTE — Telephone Encounter (Signed)
Patient has been advised and will give medication a try. Precautions given to patient and advised to call back if he has any issues with the medication.

## 2023-03-24 ENCOUNTER — Ambulatory Visit: Payer: 59

## 2023-03-28 NOTE — Telephone Encounter (Signed)
Patient called in stating that pregabalin (LYRICA) 25 MG capsule  is causing him to have vision problems,making him really drowsy,and is making his legs hurt. He would like to know if he could possibly try a different medication for the burning in his right shoulder?

## 2023-03-29 MED ORDER — MELOXICAM 7.5 MG PO TABS: 7.5000 mg | ORAL_TABLET | Freq: Every day | ORAL | 1 refills | Status: DC

## 2023-03-29 NOTE — Telephone Encounter (Signed)
He can try taking meloxicam with food.  Recommend neurosurgery clinic eval re: his neck. Let me know if he needs a referral.  Thanks.

## 2023-03-29 NOTE — Addendum Note (Signed)
Addended by: Joaquim Nam on: 03/29/2023 06:48 AM   Modules accepted: Orders

## 2023-03-29 NOTE — Telephone Encounter (Signed)
Called patient reviewed all information and repeated back to me. Will call if any questions.  He will try the Mobic with food. Will call and set up appointment

## 2023-04-19 ENCOUNTER — Telehealth: Payer: Self-pay | Admitting: Family Medicine

## 2023-04-19 NOTE — Telephone Encounter (Signed)
Prescription Request  04/19/2023  LOV: 03/09/2023  What is the name of the medication or equipment? diazepam (VALIUM) 5 MG tablet   meloxicam (MOBIC) 7.5 MG tablet, he was wanting to know if Dr. Para March could increase it to 10 MG   Have you contacted your pharmacy to request a refill? No   Which pharmacy would you like this sent to?  [facility] [address] [place] - [address] [place] [address] Phone: [phone] Fax: [phone]    Patient notified that their request is being sent to the clinical staff for review and that they should receive a response within 2 business days.   Please advise at [facility] [phone]

## 2023-04-19 NOTE — Telephone Encounter (Signed)
LOV - 03/09/23 NOV- not scheduled RF- 01/19/23 #90/0

## 2023-04-20 MED ORDER — DIAZEPAM 5 MG PO TABS: ORAL_TABLET | ORAL | 0 refills | Status: DC

## 2023-04-20 MED ORDER — MELOXICAM 15 MG PO TABS: 15.0000 mg | ORAL_TABLET | Freq: Every day | ORAL | 0 refills | Status: DC

## 2023-04-20 NOTE — Telephone Encounter (Signed)
Meloxicam does not come in a 10 mg.  It comes in a 15 mg tablet.  I sent that prescription.  Take with food.  I sent prescription for diazepam.  Sedation caution.  Thanks.

## 2023-04-20 NOTE — Addendum Note (Signed)
Addended by: Joaquim Nam on: 04/20/2023 09:30 PM   Modules accepted: Orders

## 2023-04-21 NOTE — Telephone Encounter (Signed)
LMTCB

## 2023-04-21 NOTE — Telephone Encounter (Signed)
Patient has been notified that rxs have been sent. Advised to take meloxicam with food.

## 2023-06-06 DIAGNOSIS — I214 Non-ST elevation (NSTEMI) myocardial infarction: Secondary | ICD-10-CM | POA: Diagnosis not present

## 2023-06-06 DIAGNOSIS — I2510 Atherosclerotic heart disease of native coronary artery without angina pectoris: Secondary | ICD-10-CM | POA: Diagnosis not present

## 2023-06-06 DIAGNOSIS — R0602 Shortness of breath: Secondary | ICD-10-CM | POA: Diagnosis not present

## 2023-07-04 ENCOUNTER — Encounter (INDEPENDENT_AMBULATORY_CARE_PROVIDER_SITE_OTHER): Payer: Medicare Other

## 2023-07-04 ENCOUNTER — Ambulatory Visit (INDEPENDENT_AMBULATORY_CARE_PROVIDER_SITE_OTHER): Payer: 59 | Admitting: Nurse Practitioner

## 2023-07-17 ENCOUNTER — Other Ambulatory Visit: Payer: Self-pay | Admitting: Family Medicine

## 2023-07-18 NOTE — Telephone Encounter (Signed)
Refill request for diazePAM 5 MG Oral Tablet   LOV - 03/09/23 Next OV - not scheduled Last refill - 04/20/23 #90/0

## 2023-07-19 NOTE — Telephone Encounter (Signed)
Sent. Thanks.   

## 2023-07-28 ENCOUNTER — Other Ambulatory Visit: Payer: Self-pay | Admitting: Family Medicine

## 2023-07-29 NOTE — Telephone Encounter (Signed)
Last written 04-20-23 #90 Last OV 03-09-23 No Future OV  On Plavix so I sent it to PCP to approve or deny.

## 2023-07-31 NOTE — Telephone Encounter (Signed)
Okay to continue.  Sent.  Thanks. 

## 2023-09-21 ENCOUNTER — Ambulatory Visit: Payer: 59

## 2023-09-21 VITALS — Ht 68.0 in | Wt 178.0 lb

## 2023-09-21 DIAGNOSIS — Z0000 Encounter for general adult medical examination without abnormal findings: Secondary | ICD-10-CM | POA: Diagnosis not present

## 2023-09-21 NOTE — Progress Notes (Signed)
Subjective:   Matthew Norton is a 77 y.o. male who presents for Medicare Annual/Subsequent preventive examination.  Visit Complete: Virtual I connected with  OSCAR CARPENITO on 09/21/23 by a audio enabled telemedicine application and verified that I am speaking with the correct person using two identifiers.  Patient Location: Home  Provider Location: Home Office  I discussed the limitations of evaluation and management by telemedicine. The patient expressed understanding and agreed to proceed.  Vital Signs: Because this visit was a virtual/telehealth visit, some criteria may be missing or patient reported. Any vitals not documented were not able to be obtained and vitals that have been documented are patient reported.  Patient Medicare AWV questionnaire was completed by the patient on (not done); I have confirmed that all information answered by patient is correct and no changes since this date.  Cardiac Risk Factors include: advanced age (>5men, >17 women);dyslipidemia;hypertension;male gender;sedentary lifestyle     Objective:    Today's Vitals   09/21/23 1335 09/21/23 1336  Weight: 178 lb (80.7 kg)   Height: 5\' 8"  (1.727 m)   PainSc:  2    Body mass index is 27.06 kg/m.     09/21/2023    1:47 PM 09/20/2022    9:00 PM 09/20/2022    6:43 PM 08/11/2022    1:20 PM 08/11/2022    1:11 PM 05/31/2022   11:26 AM 03/22/2022    9:21 AM  Advanced Directives  Does Patient Have a Medical Advance Directive? No No No No No No No  Would patient like information on creating a medical advance directive?  No - Patient declined Yes (ED - Information included in AVS)  No - Patient declined No - Patient declined     Current Medications (verified) Outpatient Encounter Medications as of 09/21/2023  Medication Sig   acetaminophen (TYLENOL) 500 MG tablet Take 2 tablets (1,000 mg total) by mouth every 8 (eight) hours as needed (for pain).   aspirin 81 MG tablet Take 81 mg by mouth daily.    carvedilol (COREG) 3.125 MG tablet Take 1 tablet (3.125 mg total) by mouth 2 (two) times daily with a meal.   clopidogrel (PLAVIX) 75 MG tablet Take 1 tablet (75 mg total) by mouth daily.   cyanocobalamin (,VITAMIN B-12,) 1000 MCG/ML injection 1000 mcg IM every 14 days   diazepam (VALIUM) 5 MG tablet TAKE 1/2 TO 1 (ONE-HALF TO ONE) TABLET BY MOUTH IN THE MORNING AND  1  TO  2  NIGHTLY  (MAX  3  TABLETS  PER  DAY)   fluticasone (FLONASE) 50 MCG/ACT nasal spray Place 2 sprays into both nostrils daily. For allergies   loratadine (CLARITIN) 10 MG tablet Take 10 mg by mouth daily.   meloxicam (MOBIC) 15 MG tablet Take 1 tablet by mouth once daily with food   nitroGLYCERIN (NITROSTAT) 0.4 MG SL tablet Place 1 tablet (0.4 mg total) under the tongue every 5 (five) minutes as needed for chest pain. Max 3 doses in 15 minutes.   OVER THE COUNTER MEDICATION Beta Prostate, take one by mouth daily   No facility-administered encounter medications on file as of 09/21/2023.    Allergies (verified) Methocarbamol, Bupropion, Hydrochlorothiazide, Lyrica [pregabalin], Paroxetine hcl, Tramadol hcl, Venlafaxine, Flexeril [cyclobenzaprine], Gabapentin, Hydroxyzine, Lipitor [atorvastatin], and Pravastatin   History: Past Medical History:  Diagnosis Date   Anxiety    takes Valium daily as needed   Arthritis    Cataracts, bilateral    Complication of anesthesia  slow to wake up after one surgery   Coronary artery disease    Depression    Hyperlipemia    has tried meds but not on any bc of joint aches and pains   Hypertension    takes Metoprolol and Losartan daily   Insomnia    Joint pain    Peripheral edema    Shortness of breath    with exertion    Weakness    numbness in both hands and feet.   Past Surgical History:  Procedure Laterality Date   ANTERIOR CERVICAL DECOMP/DISCECTOMY FUSION N/A 03/24/2015   Procedure: Cervical four- five, anterior cervical decompression with fusion, plating and  bonegraft;  Surgeon: Shirlean Kelly, MD;  Location: [facility];  Service: Neurosurgery;  Laterality: N/A;  C45 anterior cervical decompression with fusion plating and bonegraft   BACK SURGERY  2001   lumb lam   CARDIAC CATHETERIZATION  08,11   stents x2   CLAVICLE SURGERY  1965   fx rt    COLONOSCOPY WITH PROPOFOL N/A 03/31/2017   Procedure: COLONOSCOPY WITH PROPOFOL;  Surgeon: Wyline Mood, MD;  Location: [facility] ENDOSCOPY;  Service: Endoscopy;  Laterality: N/A;   COLONOSCOPY WITH PROPOFOL N/A 08/25/2017   Procedure: COLONOSCOPY WITH PROPOFOL;  Surgeon: Wyline Mood, MD;  Location: [facility] ENDOSCOPY;  Service: Endoscopy;  Laterality: N/A;   COLONOSCOPY WITH PROPOFOL N/A 03/22/2022   Procedure: COLONOSCOPY WITH PROPOFOL;  Surgeon: Wyline Mood, MD;  Location: [facility] ENDOSCOPY;  Service: Gastroenterology;  Laterality: N/A;   CORONARY ANGIOPLASTY     2 stents   CORONARY STENT INTERVENTION N/A 09/22/2022   Procedure: CORONARY STENT INTERVENTION;  Surgeon: Marcina Millard, MD;  Location: [facility] INVASIVE CV LAB;  Service: Cardiovascular;  Laterality: N/A;   CORONARY/GRAFT ACUTE MI REVASCULARIZATION N/A 09/20/2022   Procedure: Coronary/Graft Acute MI Revascularization;  Surgeon: Iran Ouch, MD;  Location: [facility] INVASIVE CV LAB;  Service: Cardiovascular;  Laterality: N/A;   ESOPHAGOGASTRODUODENOSCOPY     HERNIA REPAIR  2007   rt and lt ing    LEFT HEART CATH AND CORONARY ANGIOGRAPHY N/A 09/20/2022   Procedure: LEFT HEART CATH AND CORONARY ANGIOGRAPHY;  Surgeon: Iran Ouch, MD;  Location: [facility] INVASIVE CV LAB;  Service: Cardiovascular;  Laterality: N/A;   LEFT HEART CATH AND CORONARY ANGIOGRAPHY N/A 09/22/2022   Procedure: LEFT HEART CATH AND CORONARY ANGIOGRAPHY;  Surgeon: Marcina Millard, MD;  Location: [facility] INVASIVE CV LAB;  Service: Cardiovascular;  Laterality: N/A;  0930am   NAILBED REPAIR Left 09/13/2013   Procedure: LEFT THUMB NAIL REMOVAL, NAIL BED RECONSTRUCTION PARTIAL EXCISION  DISTAL PHALANX WITH COMPLEX RECONSTRUCTION ;  Surgeon: Dominica Severin, MD;  Location: [facility];  Service: Orthopedics;  Laterality: Left;   parotid mass removed     REPLANTATION THUMB  2003   left   Family History  Problem Relation Age of Onset   Hypertension Mother    Cancer Mother    Cancer Father    Colon cancer Father    Prostate cancer Father    Social History   Socioeconomic History   Marital status: Single    Spouse name: Not on file   Number of children: Not on file   Years of education: Not on file   Highest education level: Not on file  Occupational History   Not on file  Tobacco Use   Smoking status: Every Day    Current packs/day: 1.00    Average packs/day: 1 pack/day for 50.0 years (50.0 ttl pk-yrs)  Types: Cigarettes   Smokeless tobacco: Never  Vaping Use   Vaping status: Never Used  Substance and Sexual Activity   Alcohol use: No    Alcohol/week: 0.0 standard drinks of alcohol   Drug use: No   Sexual activity: Never  Other Topics Concern   Not on file  Social History Narrative   2 kids   In relationship with long term girlfriend   Social Determinants of Health   Financial Resource Strain: Low Risk  (09/21/2023)   Overall Financial Resource Strain (CARDIA)    Difficulty of Paying Living Expenses: Not very hard  Food Insecurity: No Food Insecurity (09/21/2023)   Hunger Vital Sign    Worried About Running Out of Food in the Last Year: Never true    Ran Out of Food in the Last Year: Never true  Transportation Needs: No Transportation Needs (09/21/2023)   PRAPARE - Administrator, Civil Service (Medical): No    Lack of Transportation (Non-Medical): No  Physical Activity: Inactive (09/21/2023)   Exercise Vital Sign    Days of Exercise per Week: 0 days    Minutes of Exercise per Session: 0 min  Stress: No Stress Concern Present (09/21/2023)   [employer] of Occupational Health - Occupational Stress Questionnaire     Feeling of Stress : Not at all  Social Connections: Moderately Isolated (09/21/2023)   Social Connection and Isolation Panel [NHANES]    Frequency of Communication with Friends and Family: More than three times a week    Frequency of Social Gatherings with Friends and Family: Never    Attends Religious Services: 1 to 4 times per year    Active Member of [employer] or Organizations: No    Attends Engineer, structural: Never    Marital Status: Divorced    Tobacco Counseling Ready to quit: Not Answered Counseling given: Not Answered   Clinical Intake:  Pre-visit preparation completed: No  Pain : 0-10 Pain Score: 2  Pain Location: Leg Pain Orientation: Right, Left Pain Descriptors / Indicators: Aching, Tingling Pain Onset: More than a month ago Pain Frequency: Intermittent Pain Relieving Factors: get up and walk, Meloxicam, tylenol  Pain Relieving Factors: get up and walk, Meloxicam, tylenol  BMI - recorded: 27.06 Nutritional Status: BMI 25 -29 Overweight Nutritional Risks: None Diabetes: No  How often do you need to have someone help you when you read instructions, pamphlets, or other written materials from your doctor or pharmacy?: 1 - Never  Interpreter Needed?: No  Comments: lives alone Information entered by :: B.Zahid Carneiro,LPN   Activities of Daily Living    09/21/2023    1:47 PM  In your present state of health, do you have any difficulty performing the following activities:  Hearing? 0  Vision? 1  Difficulty concentrating or making decisions? 0  Walking or climbing stairs? 1  Dressing or bathing? 0  Doing errands, shopping? 0  Preparing Food and eating ? N  Using the Toilet? N  In the past six months, have you accidently leaked urine? N  Do you have problems with loss of bowel control? N  Managing your Medications? N  Managing your Finances? N  Housekeeping or managing your Housekeeping? N    Patient Care Team: Joaquim Nam, MD as PCP -  General (Family Medicine) Tresa Res Raeanne Gathers, DO as Referring Physician (Optometry) Marcina Millard, MD as Consulting Physician (Cardiology)  Indicate any recent Medical Services you may have received from other than [facility] providers in the  past year (date may be approximate).     Assessment:   This is a routine wellness examination for Baraa.  Hearing/Vision screen Hearing Screening - Comments:: Pt says his hearing is ok Vision Screening - Comments:: Pt says his vision is alright sometimes, sometimes blurry with glasses   Goals Addressed             This Visit's Progress    COMPLETED: Increase water intake   On track    Starting 08/03/2018, I will attempt to drink at least 6-8 glasses of water daily.      Patient Stated   Not on track    Would like to walk again and be more independent.        Depression Screen    09/21/2023    1:43 PM 03/09/2023   10:31 AM 08/11/2022    1:31 PM 08/06/2021    1:37 PM 08/03/2018   11:56 AM 06/17/2017   11:57 AM 04/29/2016    3:07 PM  PHQ 2/9 Scores  PHQ - 2 Score 0 0 0 0 3 4 0  PHQ- 9 Score  0 0  3 11     Fall Risk    09/21/2023    1:39 PM 03/09/2023   10:31 AM 08/11/2022    1:11 PM 08/06/2021    1:36 PM 08/03/2018   11:56 AM  Fall Risk   Falls in the past year? 0 1 1 1  No  Number falls in past yr: 0 1 1 1    Comment    issues with balance   Injury with Fall? 0 0 0 0   Risk for fall due to : No Fall Risks Impaired balance/gait Impaired balance/gait;Impaired mobility Impaired balance/gait   Follow up Education provided;Falls prevention discussed Falls evaluation completed Falls evaluation completed;Education provided;Falls prevention discussed Falls prevention discussed     MEDICARE RISK AT HOME: Medicare Risk at Home Any stairs in or around the home?: No If so, are there any without handrails?: No Home free of loose throw rugs in walkways, pet beds, electrical cords, etc?: Yes Adequate lighting in your home to reduce risk of  falls?: Yes Life alert?: No Use of a cane, walker or w/c?: Yes (walker and cane) Grab bars in the bathroom?: No Shower chair or bench in shower?: Yes Elevated toilet seat or a handicapped toilet?: No  TIMED UP AND GO:  Was the test performed?  No    Cognitive Function:    08/03/2018    4:00 PM 06/17/2017   12:15 PM  MMSE - Mini Mental State Exam  Orientation to time 5 5  Orientation to Place 5 5  Registration 3 3  Attention/ Calculation 0 0  Recall 3 2  Recall-comments  pt was unable to recall 1 of 3 words  Language- name 2 objects 0 0  Language- repeat 1 1  Language- follow 3 step command 3 3  Language- read & follow direction 0 0  Write a sentence 0 0  Copy design 0 0  Total score 20 19        09/21/2023    1:54 PM 08/11/2022    1:13 PM  6CIT Screen  What Year? 0 points 0 points  What month? 0 points 0 points  What time? 0 points 3 points  Count back from 20 0 points 2 points  Months in reverse 0 points 2 points  Repeat phrase 0 points 4 points  Total Score 0 points 11 points  Immunizations Immunization History  Administered Date(s) Administered   Fluad Quad(high Dose 65+) 07/05/2019   Influenza Split 07/18/2014   Influenza,inj,Quad PF,6+ Mos 06/17/2017, 08/03/2018   Moderna Sars-Covid-2 Vaccination 04/29/2020, 05/30/2020   Pneumococcal Conjugate-13 04/29/2016   Pneumococcal Polysaccharide-23 06/17/2017   Zoster, Live 08/09/2013    TDAP status: Up to date  Flu Vaccine status: Declined, Education has been provided regarding the importance of this vaccine but patient still declined. Advised may receive this vaccine at local pharmacy or Health Dept. Aware to provide a copy of the vaccination record if obtained from local pharmacy or Health Dept. Verbalized acceptance and understanding.  Pneumococcal vaccine status: Up to date  Covid-19 vaccine status: Completed vaccines  Qualifies for Shingles Vaccine? Yes   Zostavax completed No   Shingrix  Completed?: No.    Education has been provided regarding the importance of this vaccine. Patient has been advised to call insurance company to determine out of pocket expense if they have not yet received this vaccine. Advised may also receive vaccine at local pharmacy or Health Dept. Verbalized acceptance and understanding.  Screening Tests Health Maintenance  Topic Date Due   Zoster Vaccines- Shingrix (1 of 2) 12/20/2023 (Originally 09/01/1997)   INFLUENZA VACCINE  01/09/2024 (Originally 05/12/2023)   Lung Cancer Screening  09/20/2024 (Originally 06/01/2023)   DTaP/Tdap/Td (1 - Tdap) 09/20/2024 (Originally 09/01/1966)   Medicare Annual Wellness (AWV)  09/20/2024   Pneumonia Vaccine 90+ Years old  Completed   Hepatitis C Screening  Completed   HPV VACCINES  Aged Out   Colonoscopy  Discontinued   COVID-19 Vaccine  Discontinued    Health Maintenance  There are no preventive care reminders to display for this patient.   Colorectal cancer screening: No longer required.   Lung Cancer Screening: (Low Dose CT Chest recommended if Age 59-80 years, 20 pack-year currently smoking OR have quit w/in 15years.) does not qualify.   Lung Cancer Screening Referral: no pt declines  Additional Screening:  Hepatitis C Screening: does not qualify; Completed 06/24/2015  Vision Screening: Recommended annual ophthalmology exams for early detection of glaucoma and other disorders of the eye. Is the patient up to date with their annual eye exam?  No  Who is the provider or what is the name of the office in which the patient attends annual eye exams? [facility] If pt is not established with a provider, would they like to be referred to a provider to establish care? No .   Dental Screening: Recommended annual dental exams for proper oral hygiene  Diabetic Foot Exam:   Community Resource Referral / Chronic Care Management: CRR required this visit?  No   CCM required this visit?  No    Plan:     I  have personally reviewed and noted the following in the patient's chart:   Medical and social history Use of alcohol, tobacco or illicit drugs  Current medications and supplements including opioid prescriptions. Patient is not currently taking opioid prescriptions. Functional ability and status Nutritional status Physical activity Advanced directives List of other physicians Hospitalizations, surgeries, and ER visits in previous 12 months Vitals Screenings to include cognitive, depression, and falls Referrals and appointments  In addition, I have reviewed and discussed with patient certain preventive protocols, quality metrics, and best practice recommendations. A written personalized care plan for preventive services as well as general preventive health recommendations were provided to patient.   Sue Lush, LPN   [id number]/29/5621   After Visit Summary: (Declined) Due to  this being a telephonic visit, with patients personalized plan was offered to patient but patient Declined AVS at this time   Nurse Notes: pt say he is alright:he relays he has no heat (the landlord will not fix). He relays he does not want to complain or make waves because his rent is very cheap and he cannot afford anything else. Pt declined a referral for help. Pt says he has arthritis and would like to know what else he can take for it. I attempted to make pt and appt w/PCP for PE (pt declined). pt says he will have to call back as he needs to check with his ride first. He has no other concerns or questions at this time.

## 2023-09-21 NOTE — Patient Instructions (Addendum)
Mr. Deubel , Thank you for taking time to come for your Medicare Wellness Visit. I appreciate your ongoing commitment to your health goals. Please review the following plan we discussed and let me know if I can assist you in the future.   Referrals/Orders/Follow-Ups/Clinician Recommendations: none  This is a list of the screening recommended for you and due dates:  Health Maintenance  Topic Date Due   Zoster (Shingles) Vaccine (1 of 2) 12/20/2023*   Flu Shot  01/09/2024*   Screening for Lung Cancer  09/20/2024*   DTaP/Tdap/Td vaccine (1 - Tdap) 09/20/2024*   Medicare Annual Wellness Visit  09/20/2024   Pneumonia Vaccine  Completed   Hepatitis C Screening  Completed   HPV Vaccine  Aged Out   Colon Cancer Screening  Discontinued   COVID-19 Vaccine  Discontinued  *Topic was postponed. The date shown is not the original due date.    Advanced directives: (Declined) Advance directive discussed with you today. Even though you declined this today, please call our office should you change your mind, and we can give you the proper paperwork for you to fill out.  Next Medicare Annual Wellness Visit scheduled for next year: Yes 09/21/24 @ 1:40pm telephone

## 2023-09-27 ENCOUNTER — Encounter: Payer: 59 | Admitting: Family Medicine

## 2023-10-14 ENCOUNTER — Ambulatory Visit (INDEPENDENT_AMBULATORY_CARE_PROVIDER_SITE_OTHER): Payer: 59 | Admitting: Family Medicine

## 2023-10-14 ENCOUNTER — Encounter: Payer: Self-pay | Admitting: Family Medicine

## 2023-10-14 VITALS — BP 128/66 | HR 86 | Temp 99.1°F | Ht 67.5 in | Wt 176.6 lb

## 2023-10-14 DIAGNOSIS — R0602 Shortness of breath: Secondary | ICD-10-CM | POA: Diagnosis not present

## 2023-10-14 DIAGNOSIS — F419 Anxiety disorder, unspecified: Secondary | ICD-10-CM

## 2023-10-14 DIAGNOSIS — M4802 Spinal stenosis, cervical region: Secondary | ICD-10-CM | POA: Diagnosis not present

## 2023-10-14 DIAGNOSIS — Z23 Encounter for immunization: Secondary | ICD-10-CM | POA: Diagnosis not present

## 2023-10-14 DIAGNOSIS — Z659 Problem related to unspecified psychosocial circumstances: Secondary | ICD-10-CM | POA: Diagnosis not present

## 2023-10-14 DIAGNOSIS — I10 Essential (primary) hypertension: Secondary | ICD-10-CM | POA: Diagnosis not present

## 2023-10-14 DIAGNOSIS — F32A Depression, unspecified: Secondary | ICD-10-CM

## 2023-10-14 DIAGNOSIS — E538 Deficiency of other specified B group vitamins: Secondary | ICD-10-CM

## 2023-10-14 DIAGNOSIS — Z125 Encounter for screening for malignant neoplasm of prostate: Secondary | ICD-10-CM | POA: Diagnosis not present

## 2023-10-14 DIAGNOSIS — E785 Hyperlipidemia, unspecified: Secondary | ICD-10-CM

## 2023-10-14 DIAGNOSIS — Z0000 Encounter for general adult medical examination without abnormal findings: Secondary | ICD-10-CM

## 2023-10-14 DIAGNOSIS — Z7189 Other specified counseling: Secondary | ICD-10-CM

## 2023-10-14 LAB — COMPREHENSIVE METABOLIC PANEL
ALT: 9 U/L (ref 0–53)
AST: 14 U/L (ref 0–37)
Albumin: 4.4 g/dL (ref 3.5–5.2)
Alkaline Phosphatase: 64 U/L (ref 39–117)
BUN: 16 mg/dL (ref 6–23)
CO2: 28 meq/L (ref 19–32)
Calcium: 10.1 mg/dL (ref 8.4–10.5)
Chloride: 102 meq/L (ref 96–112)
Creatinine, Ser: 1.24 mg/dL (ref 0.40–1.50)
GFR: 56.63 mL/min — ABNORMAL LOW (ref >60.00)
Glucose, Bld: 84 mg/dL (ref 70–99)
Potassium: 5.1 meq/L (ref 3.5–5.1)
Sodium: 138 meq/L (ref 135–145)
Total Bilirubin: 0.5 mg/dL (ref 0.2–1.2)
Total Protein: 7.3 g/dL (ref 6.0–8.3)

## 2023-10-14 LAB — CBC WITH DIFFERENTIAL/PLATELET
Basophils Absolute: 0 10*3/uL (ref 0.0–0.1)
Basophils Relative: 0.2 % (ref 0.0–3.0)
Eosinophils Absolute: 0.1 10*3/uL (ref 0.0–0.7)
Eosinophils Relative: 1.2 % (ref 0.0–5.0)
HCT: 45.6 % (ref 39.0–52.0)
Hemoglobin: 15.6 g/dL (ref 13.0–17.0)
Lymphocytes Relative: 32 % (ref 12.0–46.0)
Lymphs Abs: 1.4 10*3/uL (ref 0.7–4.0)
MCHC: 34.1 g/dL (ref 30.0–36.0)
MCV: 98.5 fL (ref 78.0–100.0)
Monocytes Absolute: 0.3 10*3/uL (ref 0.1–1.0)
Monocytes Relative: 6.8 % (ref 3.0–12.0)
Neutro Abs: 2.6 10*3/uL (ref 1.4–7.7)
Neutrophils Relative %: 59.8 % (ref 43.0–77.0)
Platelets: 152 10*3/uL (ref 150.0–400.0)
RBC: 4.63 Mil/uL (ref 4.22–5.81)
RDW: 13.5 % (ref 11.5–15.5)
WBC: 4.4 10*3/uL (ref 4.0–10.5)

## 2023-10-14 LAB — LIPID PANEL
Cholesterol: 212 mg/dL — ABNORMAL HIGH (ref 0–200)
HDL: 36.3 mg/dL — ABNORMAL LOW (ref >39.00)
LDL Cholesterol: 149 mg/dL — ABNORMAL HIGH (ref 0–99)
NonHDL: 175.39
Total CHOL/HDL Ratio: 6
Triglycerides: 133 mg/dL (ref 0.0–149.0)
VLDL: 26.6 mg/dL (ref 0.0–40.0)

## 2023-10-14 LAB — VITAMIN B12: Vitamin B-12: 140 pg/mL — ABNORMAL LOW (ref 211–911)

## 2023-10-14 LAB — BRAIN NATRIURETIC PEPTIDE: Pro B Natriuretic peptide (BNP): 110 pg/mL — ABNORMAL HIGH (ref 0.0–100.0)

## 2023-10-14 LAB — TSH: TSH: 0.63 u[IU]/mL (ref 0.35–5.50)

## 2023-10-14 LAB — PSA, MEDICARE: PSA: 0.39 ng/mL (ref 0.10–4.00)

## 2023-10-14 MED ORDER — CYANOCOBALAMIN 1000 MCG/ML IJ SOLN
1000.0000 ug | Freq: Once | INTRAMUSCULAR | Status: AC
  Administered 2023-10-14: 1000 ug via INTRAMUSCULAR

## 2023-10-14 MED ORDER — MELOXICAM 15 MG PO TABS: 15.0000 mg | ORAL_TABLET | Freq: Every day | ORAL | 1 refills | Status: DC

## 2023-10-14 MED ORDER — DIAZEPAM 5 MG PO TABS: ORAL_TABLET | ORAL | 2 refills | Status: DC

## 2023-10-14 MED ORDER — NITROGLYCERIN 0.4 MG SL SUBL: 0.4000 mg | SUBLINGUAL_TABLET | SUBLINGUAL | 3 refills | Status: AC | PRN

## 2023-10-14 NOTE — Patient Instructions (Addendum)
 Please call about seeing cardiology.  [facility]  [address]  [place], [place] [address]  [phone]   Labs today. Then B12 shot. Take care.  Glad to see you.

## 2023-10-14 NOTE — Progress Notes (Signed)
 D/w pt about heat at home.  He has heat in the house but had trouble getting replacement parts for his pilot light.  He is still safe at home.  D/w pt about financial strain.  SW referral ordered.    Mood d/w pt.  Anxiety vs irritability d/w pt.  Taking diazepam  as needed.  That helped.  No adverse effect on medication.  Not sedated.  Has been off B12 replacement.  Discussed restart.  Dose given at office visit.  See notes on labs.  He had aches on statin.  Off lipitor in the meantime.   No recent NTG use.   Hypertension:    Using medication without problems or lightheadedness: yes Chest pain with exertion:no Edema:no Short of breath: only with sig exertion.  Stable and chronic.     He still has back pain upon standing.  Resolved with laying down.  D/w pt about path/phys of spinal stenosis.  Meloxicam  helps some.    D/w pt about setting up cardiology follow up.  See after visit summary.  Zostavax prev done.  PNA prev done.  Covid vaccine prev done.   Flu shot 2025 Tetanus d/w pt.   PSA pending.   Colonoscopy 2023.   Living will d/w pt.  If patient were incapacitated, then would have [employer].   Meds, vitals, and allergies reviewed.   ROS: Per HPI unless specifically indicated in ROS section   GEN: nad, alert and oriented HEENT: ncat NECK: supple w/o LA CV: rrr.  PULM: ctab, no inc wob ABD: soft, +bs EXT: no edema SKIN: Well-perfused

## 2023-10-16 ENCOUNTER — Other Ambulatory Visit: Payer: Self-pay | Admitting: Family Medicine

## 2023-10-16 DIAGNOSIS — E538 Deficiency of other specified B group vitamins: Secondary | ICD-10-CM

## 2023-10-16 NOTE — Assessment & Plan Note (Signed)
 Statin intolerant.  Discussed with patient about setting up follow-up with cardiology.

## 2023-10-16 NOTE — Assessment & Plan Note (Signed)
 Continue diazepam as needed.  Okay for outpatient follow-up.

## 2023-10-16 NOTE — Assessment & Plan Note (Signed)
 D/w pt about path/phys of spinal stenosis.  Meloxicam helps some.   Continue as is.

## 2023-10-16 NOTE — Assessment & Plan Note (Signed)
 Zostavax prev done.  PNA prev done.  Covid vaccine prev done.   Flu shot 2025 Tetanus d/w pt.   PSA pending.   Colonoscopy 2023.   Living will d/w pt.  If patient were incapacitated, then would have [employer].

## 2023-10-16 NOTE — Assessment & Plan Note (Signed)
 Dose given at office visit.  See notes on labs.

## 2023-10-16 NOTE — Assessment & Plan Note (Signed)
Living will d/w pt.  If patient were incapacitated, then would have Carolyn Staten designated.  ?

## 2023-10-16 NOTE — Assessment & Plan Note (Signed)
See notes on labs.  Continue metoprolol.

## 2023-10-17 ENCOUNTER — Telehealth: Payer: Self-pay | Admitting: *Deleted

## 2023-10-17 NOTE — Progress Notes (Signed)
 Complex Care Management Note Care Guide Note  10/17/2023 Name: RUFINO STAUP MRN: [id number] DOB: January 20, 1946   Complex Care Management Outreach Attempts: An unsuccessful telephone outreach was attempted today to offer the patient information about available complex care management services.  Follow Up Plan:  Additional outreach attempts will be made to offer the patient complex care management information and services.   Encounter Outcome:  No Answer  Thedford Franks, CCMA Care Coordination Care Guide Direct Dial: [phone]

## 2023-10-18 NOTE — Progress Notes (Signed)
 Complex Care Management Note Care Guide Note  10/18/2023 Name: Matthew Norton MRN: [id number] DOB: 06/11/1946   Complex Care Management Outreach Attempts: A second unsuccessful outreach was attempted today to offer the patient with information about available complex care management services.  Follow Up Plan:  Additional outreach attempts will be made to offer the patient complex care management information and services.   Encounter Outcome:  No Answer  Thedford Franks, CCMA Care Coordination Care Guide Direct Dial: [phone]

## 2023-10-19 NOTE — Progress Notes (Signed)
 Complex Care Management Note Care Guide Note  10/19/2023 Name: ARAS ALBARRAN MRN: [id number] DOB: 24-Dec-1945   Complex Care Management Outreach Attempts: A third unsuccessful outreach was attempted today to offer the patient with information about available complex care management services.  Follow Up Plan:  No further outreach attempts will be made at this time. We have been unable to contact the patient to offer or enroll patient in complex care management services.  Encounter Outcome:  No Answer  Thedford Franks, CCMA Care Coordination Care Guide Direct Dial: [phone]

## 2023-10-26 ENCOUNTER — Ambulatory Visit: Payer: 59

## 2023-10-31 ENCOUNTER — Other Ambulatory Visit: Payer: Self-pay | Admitting: Family Medicine

## 2023-11-02 ENCOUNTER — Ambulatory Visit: Payer: 59

## 2023-11-08 ENCOUNTER — Other Ambulatory Visit: Payer: Self-pay | Admitting: Family Medicine

## 2023-11-15 ENCOUNTER — Ambulatory Visit (INDEPENDENT_AMBULATORY_CARE_PROVIDER_SITE_OTHER): Payer: 59

## 2023-11-15 DIAGNOSIS — E538 Deficiency of other specified B group vitamins: Secondary | ICD-10-CM | POA: Diagnosis not present

## 2023-11-15 MED ORDER — CYANOCOBALAMIN 1000 MCG/ML IJ SOLN
1000.0000 ug | Freq: Once | INTRAMUSCULAR | Status: AC
  Administered 2023-11-15: 1000 ug via INTRAMUSCULAR

## 2023-11-15 NOTE — Progress Notes (Signed)
Per orders of Dr. Crawford Givens who is out of office and Dr Eustaquio Boyden who is in office, injection of vitamni b 12 given by Lewanda Rife in left deltoid. Patient tolerated injection well. Patient will make appointment for 2 week.

## 2023-11-29 ENCOUNTER — Ambulatory Visit (INDEPENDENT_AMBULATORY_CARE_PROVIDER_SITE_OTHER): Payer: 59

## 2023-11-29 DIAGNOSIS — E538 Deficiency of other specified B group vitamins: Secondary | ICD-10-CM | POA: Diagnosis not present

## 2023-11-29 MED ORDER — CYANOCOBALAMIN 1000 MCG/ML IJ SOLN
1000.0000 ug | Freq: Once | INTRAMUSCULAR | Status: AC
  Administered 2023-11-29: 1000 ug via INTRAMUSCULAR

## 2023-11-29 NOTE — Progress Notes (Signed)
Per orders of Dr. Crawford Givens, injection of vitamin b 12 given by Lewanda Rife in right deltoid. Patient tolerated injection well. Patient will make appointment for 2 week.

## 2023-12-12 ENCOUNTER — Telehealth: Payer: Self-pay

## 2023-12-12 NOTE — Telephone Encounter (Signed)
LVM for patient to cb and reschedule.

## 2023-12-12 NOTE — Telephone Encounter (Signed)
 Copied from CRM [id number]. Topic: Appointments - Scheduling Inquiry for Clinic >> Dec 12, 2023  8:40 AM Hector Shade B wrote: Reason for CRM: Patient needs to reschedule his b12 injection for the 12th around 10am, was unable to reschedule for the patient, please assist

## 2023-12-13 NOTE — Telephone Encounter (Signed)
 Contacted pt, line was busy.

## 2023-12-13 NOTE — Telephone Encounter (Signed)
 Patient called back in needing his appt changed to march 12 at 10:00 I was unable to change it as well in epic

## 2023-12-14 ENCOUNTER — Ambulatory Visit: Payer: 59

## 2023-12-21 ENCOUNTER — Telehealth: Payer: Self-pay

## 2023-12-21 NOTE — Telephone Encounter (Signed)
 Patient notified of Dr. Armanda Heritage recommendations and that once transportation is set up to notify us so that we can get him schedule to receive injection in office.

## 2023-12-21 NOTE — Telephone Encounter (Signed)
 Copied from CRM [id number]. Topic: Clinical - Medical Advice >> Dec 21, 2023  8:13 AM Shelbie Proctor wrote: Reason for CRM: Patient [id number] wants to cancel vitamin B12 appointment for today. Informed patient there's no appointment for today. Patient asked if he could take oral Vitamin B12 until he can set up transporation to the office. Please advise and call back.

## 2023-12-21 NOTE — Telephone Encounter (Signed)
 I would try to get the B12 injections done but in the meantime could take B12 by mouth, can get that OTC.  However, I would expect the injections to work better/sooner. Thanks.

## 2024-01-23 ENCOUNTER — Other Ambulatory Visit: Payer: Self-pay | Admitting: Family Medicine

## 2024-04-19 ENCOUNTER — Other Ambulatory Visit: Payer: Self-pay | Admitting: Family Medicine

## 2024-04-20 NOTE — Telephone Encounter (Signed)
 Sent. Thanks.  Please try to set up f/u nonfasting lab visit.   B12 level ordered.

## 2024-04-20 NOTE — Telephone Encounter (Signed)
 LOV: 10/14/23 WNC:WNUYPWH SCHEDULED LAST REFILL: meloxicam  (MOBIC ) 15 MG tablet 10/14/23 90 tablets 1 refills Clopidogrel  Bisulfate 75 MG Oral Tablet 01/24/24 90 tablets 0 refills

## 2024-04-23 ENCOUNTER — Other Ambulatory Visit: Payer: Self-pay | Admitting: Family Medicine

## 2024-04-23 NOTE — Telephone Encounter (Unsigned)
 Copied from CRM [id number]. Topic: Clinical - Medication Refill >> Apr 23, 2024 12:19 PM [place] M wrote: Medication: clopidogrel  (PLAVIX ) 75 MG tablet  Has the patient contacted their pharmacy? Yes (Agent: If no, request that the patient contact the pharmacy for the refill. If patient does not wish to contact the pharmacy document the reason why and proceed with request.) (Agent: If yes, when and what did the pharmacy advise?)  This is the patient's preferred pharmacy:  [facility] [address] [place] - [address] [place] [address] Phone: [phone] Fax: [phone]  Is this the correct pharmacy for this prescription? Yes If no, delete pharmacy and type the correct one.   Has the prescription been filled recently? Yes  Is the patient out of the medication? Yes  Has the patient been seen for an appointment in the last year OR does the patient have an upcoming appointment? Yes  Can we respond through MyChart? Yes  Agent: Please be advised that Rx refills may take up to 3 business days. We ask that you follow-up with your pharmacy.

## 2024-04-23 NOTE — Telephone Encounter (Signed)
 Copied from CRM [id number]. Topic: General - Other >> Apr 23, 2024 11:00 AM Jasmin G wrote: Reason for CRM: Pt. Received a phone call from clinic. I could not find any info on it. Please call pt. Back ASAP.

## 2024-04-23 NOTE — Telephone Encounter (Signed)
Lvmtcb, no mychart set up to send message  

## 2024-04-25 NOTE — Telephone Encounter (Signed)
 Pt sch labs with e2c2 for 04/30/24

## 2024-04-30 ENCOUNTER — Other Ambulatory Visit

## 2024-05-11 ENCOUNTER — Other Ambulatory Visit

## 2024-05-11 DIAGNOSIS — E538 Deficiency of other specified B group vitamins: Secondary | ICD-10-CM | POA: Diagnosis not present

## 2024-05-11 LAB — VITAMIN B12: Vitamin B-12: 412 pg/mL (ref 211–911)

## 2024-05-13 ENCOUNTER — Ambulatory Visit: Payer: Self-pay | Admitting: Family Medicine

## 2024-07-09 ENCOUNTER — Other Ambulatory Visit: Payer: Self-pay | Admitting: Family Medicine

## 2024-07-09 MED ORDER — DIAZEPAM 5 MG PO TABS: ORAL_TABLET | ORAL | 2 refills | Status: AC

## 2024-07-09 NOTE — Telephone Encounter (Signed)
 LOV: 10/14/2023 NOV: nothing scheduled Last Refill: diazepam  (VALIUM ) 5 MG tablet  10/14/23 90 tablets 2 refills

## 2024-07-09 NOTE — Telephone Encounter (Signed)
 Copied from CRM #[id number]. Topic: Clinical - Medication Refill >> Jul 09, 2024  9:46 AM Timindy P wrote: Medication:  diazepam  (VALIUM ) 5 MG tablet    Has the patient contacted their pharmacy? Yes- unable to connect. He tried calling 3 times this morning.   This is the patient's preferred pharmacy:  [facility] [address] [place] - [address] [place] [address] Phone: [phone] Fax: [phone]  Is this the correct pharmacy for this prescription? Yes If no, delete pharmacy and type the correct one.   Has the prescription been filled recently? No  Is the patient out of the medication? No  Has the patient been seen for an appointment in the last year OR does the patient have an upcoming appointment? Yes  Can we respond through MyChart? No- phone calls [phone]  Agent: Please be advised that Rx refills may take up to 3 business days. We ask that you follow-up with your pharmacy.

## 2024-09-21 ENCOUNTER — Ambulatory Visit: Payer: 59

## 2024-09-21 VITALS — Ht 67.5 in | Wt 176.0 lb

## 2024-09-21 DIAGNOSIS — Z0000 Encounter for general adult medical examination without abnormal findings: Secondary | ICD-10-CM | POA: Diagnosis not present

## 2024-09-21 NOTE — Patient Instructions (Signed)
 [name],  Thank you for taking the time for your Medicare Wellness Visit. I appreciate your continued commitment to your health goals. Please review the care plan we discussed, and feel free to reach out if I can assist you further.  Please note that Annual Wellness Visits do not include a physical exam. Some assessments may be limited, especially if the visit was conducted virtually. If needed, we may recommend an in-person follow-up with your provider.  Ongoing Care Seeing your primary care provider every 3 to 6 months helps us  monitor your health and provide consistent, personalized care.   Referrals If a referral was made during today's visit and you haven't received any updates within two weeks, please contact the referred provider directly to check on the status.  Recommended Screenings:  Health Maintenance  Topic Date Due   DTaP/Tdap/Td vaccine (1 - Tdap) Never done   Zoster (Shingles) Vaccine (1 of 2) 09/01/1997   Screening for Lung Cancer  06/01/2023   Flu Shot  05/11/2024   Medicare Annual Wellness Visit  09/20/2024   Pneumococcal Vaccine for age over 79  Completed   Hepatitis C Screening  Completed   Meningitis B Vaccine  Aged Out   Colon Cancer Screening  Discontinued   COVID-19 Vaccine  Discontinued       09/21/2024    1:47 PM  Advanced Directives  Does Patient Have a Medical Advance Directive? No    Vision: Annual vision screenings are recommended for early detection of glaucoma, cataracts, and diabetic retinopathy. These exams can also reveal signs of chronic conditions such as diabetes and high blood pressure.  Dental: Annual dental screenings help detect early signs of oral cancer, gum disease, and other conditions linked to overall health, including heart disease and diabetes.

## 2024-09-21 NOTE — Progress Notes (Signed)
 Chief Complaint  Patient presents with   Medicare Wellness     Subjective:   Matthew Norton is a 78 y.o. male who presents for a Medicare Annual Wellness Visit.  Visit info / Clinical Intake: Medicare Wellness Visit Type:: Subsequent Annual Wellness Visit Persons participating in visit and providing information:: patient Medicare Wellness Visit Mode:: Telephone If telephone:: video declined Since this visit was completed virtually, some vitals may be partially provided or unavailable. Missing vitals are due to the limitations of the virtual format.: Unable to obtain vitals - no equipment If Telephone or Video please confirm:: I connected with patient using audio/video enable telemedicine. I verified patient identity with two identifiers, discussed telehealth limitations, and patient agreed to proceed. Patient Location:: home Provider Location:: home office Interpreter Needed?: No Pre-visit prep was completed: yes AWV questionnaire completed by patient prior to visit?: no Living arrangements:: (!) lives alone Patient's Overall Health Status Rating: (!) fair Typical amount of pain: some Does pain affect daily life?: (!) yes Are you currently prescribed opioids?: no  Dietary Habits and Nutritional Risks How many meals a day?: (!) 1 Eats fruit and vegetables daily?: yes (some days) Most meals are obtained by: preparing own meals (microwave dinners) In the last 2 weeks, have you had any of the following?: none Diabetic:: no  Functional Status Activities of Daily Living (to include ambulation/medication): Independent Ambulation: Independent with device- listed below Home Assistive Devices/Equipment: Walker (specify Type); Cane Medication Administration: Independent Home Management (perform basic housework or laundry): Independent Manage your own finances?: yes Primary transportation is: family / friends Concerns about vision?: no *vision screening is required for WTM* Concerns  about hearing?: no  Fall Screening Falls in the past year?: 1 Number of falls in past year: 1 Was there an injury with Fall?: 0 Fall Risk Category Calculator: 2 Patient Fall Risk Level: Moderate Fall Risk  Fall Risk Patient at Risk for Falls Due to: Impaired balance/gait; Impaired mobility; Orthopedic patient Fall risk Follow up: Falls evaluation completed; Education provided; Falls prevention discussed  Home and Transportation Safety: All rugs have non-skid backing?: N/A, no rugs All stairs or steps have railings?: N/A, no stairs Grab bars in the bathtub or shower?: yes Have non-skid surface in bathtub or shower?: yes Good home lighting?: yes Regular seat belt use?: yes Hospital stays in the last year:: no  Cognitive Assessment Difficulty concentrating, remembering, or making decisions? : no Will 6CIT or Mini Cog be Completed: yes What year is it?: 0 points What month is it?: 0 points Give patient an address phrase to remember (5 components): [address] [place]  About what time is it?: 0 points Count backwards from 20 to 1: 0 points Say the months of the year in reverse: 4 points Repeat the address phrase from earlier: 10 points 6 CIT Score: 14 points  Advance Directives (For Healthcare) Does Patient Have a Medical Advance Directive?: No Would patient like information on creating a medical advance directive?: No - Patient declined  Reviewed/Updated  Reviewed/Updated: Reviewed All (Medical, Surgical, Family, Medications, Allergies, Care Teams, Patient Goals)    Allergies (verified) Methocarbamol, Bupropion, Hydrochlorothiazide, Lyrica  [pregabalin ], Paroxetine hcl, Tramadol hcl, Venlafaxine, Flexeril [cyclobenzaprine], Gabapentin , Hydroxyzine , Lipitor [atorvastatin ], and Pravastatin   Current Medications (verified) Outpatient Encounter Medications as of 09/21/2024  Medication Sig   acetaminophen  (TYLENOL ) 500 MG tablet Take 2 tablets (1,000 mg total) by  mouth every 8 (eight) hours as needed (for pain).   aspirin  81 MG tablet Take 81 mg by  mouth daily.   clopidogrel  (PLAVIX ) 75 MG tablet Take 1 tablet by mouth once daily   cyanocobalamin  (,VITAMIN B-12,) 1000 MCG/ML injection 1000 mcg IM every 14 days   diazepam  (VALIUM ) 5 MG tablet TAKE 1/2 TO 1 (ONE-HALF TO ONE) TABLET BY MOUTH IN THE MORNING AND  1  TO  2  NIGHTLY  (MAX  3  TABLETS  PER  DAY)   fluticasone  (FLONASE ) 50 MCG/ACT nasal spray Place 2 sprays into both nostrils daily. For allergies   loratadine  (CLARITIN ) 10 MG tablet Take 10 mg by mouth daily.   meloxicam  (MOBIC ) 15 MG tablet Take 1 tablet by mouth once daily with food   metoprolol  succinate (TOPROL -XL) 25 MG 24 hr tablet Take 25 mg by mouth daily.   nitroGLYCERIN  (NITROSTAT ) 0.4 MG SL tablet Place 1 tablet (0.4 mg total) under the tongue every 5 (five) minutes as needed for chest pain. Max 3 doses in 15 minutes.   No facility-administered encounter medications on file as of 09/21/2024.    History: Past Medical History:  Diagnosis Date   Anxiety    takes Valium  daily as needed   Arthritis    Cataracts, bilateral    Complication of anesthesia    slow to wake up after one surgery   Coronary artery disease    Depression    Hyperlipemia    has tried meds but not on any bc of joint aches and pains   Hypertension    takes Metoprolol  and Losartan  daily   Insomnia    Joint pain    Peripheral edema    Shortness of breath    with exertion    Weakness    numbness in both hands and feet.   Past Surgical History:  Procedure Laterality Date   ANTERIOR CERVICAL DECOMP/DISCECTOMY FUSION N/A 03/24/2015   Procedure: Cervical four- five, anterior cervical decompression with fusion, plating and bonegraft;  Surgeon: Lamar Peaches, MD;  Location: [facility];  Service: Neurosurgery;  Laterality: N/A;  C45 anterior cervical decompression with fusion plating and bonegraft   BACK SURGERY  2001   lumb lam   CARDIAC CATHETERIZATION   08,11   stents x2   CLAVICLE SURGERY  1965   fx rt    COLONOSCOPY WITH PROPOFOL  N/A 03/31/2017   Procedure: COLONOSCOPY WITH PROPOFOL ;  Surgeon: Therisa Bi, MD;  Location: [facility] ENDOSCOPY;  Service: Endoscopy;  Laterality: N/A;   COLONOSCOPY WITH PROPOFOL  N/A 08/25/2017   Procedure: COLONOSCOPY WITH PROPOFOL ;  Surgeon: Therisa Bi, MD;  Location: [facility] ENDOSCOPY;  Service: Endoscopy;  Laterality: N/A;   COLONOSCOPY WITH PROPOFOL  N/A 03/22/2022   Procedure: COLONOSCOPY WITH PROPOFOL ;  Surgeon: Therisa Bi, MD;  Location: [facility] ENDOSCOPY;  Service: Gastroenterology;  Laterality: N/A;   CORONARY ANGIOPLASTY     2 stents   CORONARY STENT INTERVENTION N/A 09/22/2022   Procedure: CORONARY STENT INTERVENTION;  Surgeon: Ammon Blunt, MD;  Location: [facility] INVASIVE CV LAB;  Service: Cardiovascular;  Laterality: N/A;   CORONARY/GRAFT ACUTE MI REVASCULARIZATION N/A 09/20/2022   Procedure: Coronary/Graft Acute MI Revascularization;  Surgeon: Darron Deatrice LABOR, MD;  Location: [facility] INVASIVE CV LAB;  Service: Cardiovascular;  Laterality: N/A;   ESOPHAGOGASTRODUODENOSCOPY     HERNIA REPAIR  2007   rt and lt ing    LEFT HEART CATH AND CORONARY ANGIOGRAPHY N/A 09/20/2022   Procedure: LEFT HEART CATH AND CORONARY ANGIOGRAPHY;  Surgeon: Darron Deatrice LABOR, MD;  Location: [facility] INVASIVE CV LAB;  Service: Cardiovascular;  Laterality: N/A;   LEFT  HEART CATH AND CORONARY ANGIOGRAPHY N/A 09/22/2022   Procedure: LEFT HEART CATH AND CORONARY ANGIOGRAPHY;  Surgeon: Ammon Blunt, MD;  Location: [facility] INVASIVE CV LAB;  Service: Cardiovascular;  Laterality: N/A;  0930am   NAILBED REPAIR Left 09/13/2013   Procedure: LEFT THUMB NAIL REMOVAL, NAIL BED RECONSTRUCTION PARTIAL EXCISION DISTAL PHALANX WITH COMPLEX RECONSTRUCTION ;  Surgeon: Elsie Mussel, MD;  Location:  SURGERY CENTER;  Service: Orthopedics;  Laterality: Left;   parotid mass removed     REPLANTATION THUMB  2003   left   Family History   Problem Relation Age of Onset   Hypertension Mother    Cancer Mother    Cancer Father    Colon cancer Father    Prostate cancer Father    Social History   Occupational History   Not on file  Tobacco Use   Smoking status: Every Day    Current packs/day: 1.00    Average packs/day: 1 pack/day for 50.0 years (50.0 ttl pk-yrs)    Types: Cigarettes   Smokeless tobacco: Never  Vaping Use   Vaping status: Never Used  Substance and Sexual Activity   Alcohol use: No    Alcohol/week: 0.0 standard drinks of alcohol   Drug use: No   Sexual activity: Never   Tobacco Counseling Ready to quit: Not Answered Counseling given: Not Answered  SDOH Screenings   Food Insecurity: No Food Insecurity (09/21/2024)  Housing: Unknown (09/21/2024)  Transportation Needs: No Transportation Needs (09/21/2024)  Utilities: Not At Risk (09/21/2024)  Alcohol Screen: Low Risk (09/21/2023)  Depression (PHQ2-9): Low Risk (09/21/2024)  Financial Resource Strain: Low Risk (09/21/2023)  Physical Activity: Inactive (09/21/2024)  Social Connections: Socially Isolated (09/21/2024)  Stress: Stress Concern Present (09/21/2024)  Tobacco Use: High Risk (09/21/2024)  Health Literacy: Adequate Health Literacy (09/21/2024)   See flowsheets for full screening details  Depression Screen PHQ 2 & 9 Depression Scale- Over the past 2 weeks, how often have you been bothered by any of the following problems? Little interest or pleasure in doing things: 0 Feeling down, depressed, or hopeless (PHQ Adolescent also includes...irritable): 1 PHQ-2 Total Score: 1     Goals Addressed             This Visit's Progress    Patient Stated   Not on track    Would like to walk again and be more independent.      Patient Stated   On track    No goals             Objective:    Today's Vitals   09/21/24 1344  Weight: 176 lb (79.8 kg)  Height: 5' 7.5 (1.715 m)   Body mass index is 27.16 kg/m.  Hearing/Vision  screen Vision Screening - Comments:: Not UTD with visits Dr Mandy Immunizations and Health Maintenance Health Maintenance  Topic Date Due   DTaP/Tdap/Td (1 - Tdap) Never done   Zoster Vaccines- Shingrix (1 of 2) 09/01/1997   Lung Cancer Screening  06/01/2023   Influenza Vaccine  05/11/2024   Medicare Annual Wellness (AWV)  09/20/2024   Pneumococcal Vaccine: 50+ Years  Completed   Hepatitis C Screening  Completed   Meningococcal B Vaccine  Aged Out   Colonoscopy  Discontinued   COVID-19 Vaccine  Discontinued        Assessment/Plan:  This is a routine wellness examination for Carder.  Patient Care Team: Cleatus Arlyss RAMAN, MD as PCP - General (Family Medicine) Mandy Fleeta RAMAN, DO as Referring Physician (  Optometry) Ammon Blunt, MD as Consulting Physician (Cardiology)  I have personally reviewed and noted the following in the patients chart:   Medical and social history Use of alcohol, tobacco or illicit drugs  Current medications and supplements including opioid prescriptions. Functional ability and status Nutritional status Physical activity Advanced directives List of other physicians Hospitalizations, surgeries, and ER visits in previous 12 months Vitals Screenings to include cognitive, depression, and falls Referrals and appointments  No orders of the defined types were placed in this encounter.  In addition, I have reviewed and discussed with patient certain preventive protocols, quality metrics, and best practice recommendations. A written personalized care plan for preventive services as well as general preventive health recommendations were provided to patient.   Erminio LITTIE Saris, LPN   [id number]   No follow-ups on file.  After Visit Summary: (Declined) Due to this being a telephonic visit, with patients personalized plan was offered to patient but patient Declined AVS at this time   Nurse Notes: No voiced or noted concerns at this time Patient advised  to keep follow-up appointment with PCP (Jan 2026) Appointment(s) made: (Jan 2026) HM Addressed: Will get Influenza a PCP visit: declines all others at this time

## 2024-10-17 ENCOUNTER — Other Ambulatory Visit: Payer: Self-pay | Admitting: Family Medicine

## 2024-10-19 NOTE — Telephone Encounter (Signed)
 Patient has not been seen by pcp in over a year. Does have visit in February

## 2024-10-23 ENCOUNTER — Encounter: Admitting: Family Medicine

## 2024-11-15 ENCOUNTER — Encounter: Admitting: Family Medicine
